# Patient Record
Sex: Male | Born: 1950
Health system: Southern US, Community
[De-identification: ages and names within clinical notes are randomized; demographics above are authoritative.]

## PROBLEM LIST (undated history)

## (undated) DIAGNOSIS — Z9889 Other specified postprocedural states: Secondary | ICD-10-CM

## (undated) DIAGNOSIS — N400 Enlarged prostate without lower urinary tract symptoms: Secondary | ICD-10-CM

## (undated) DIAGNOSIS — G4733 Obstructive sleep apnea (adult) (pediatric): Secondary | ICD-10-CM

## (undated) DIAGNOSIS — R112 Nausea with vomiting, unspecified: Secondary | ICD-10-CM

## (undated) DIAGNOSIS — E785 Hyperlipidemia, unspecified: Secondary | ICD-10-CM

## (undated) DIAGNOSIS — Z803 Family history of malignant neoplasm of breast: Secondary | ICD-10-CM

## (undated) DIAGNOSIS — M1990 Unspecified osteoarthritis, unspecified site: Secondary | ICD-10-CM

## (undated) DIAGNOSIS — J45909 Unspecified asthma, uncomplicated: Secondary | ICD-10-CM

## (undated) DIAGNOSIS — M109 Gout, unspecified: Secondary | ICD-10-CM

## (undated) DIAGNOSIS — G4730 Sleep apnea, unspecified: Secondary | ICD-10-CM

## (undated) DIAGNOSIS — R7303 Prediabetes: Secondary | ICD-10-CM

## (undated) DIAGNOSIS — Z808 Family history of malignant neoplasm of other organs or systems: Secondary | ICD-10-CM

## (undated) DIAGNOSIS — A419 Sepsis, unspecified organism: Secondary | ICD-10-CM

## (undated) DIAGNOSIS — R339 Retention of urine, unspecified: Secondary | ICD-10-CM

## (undated) DIAGNOSIS — E119 Type 2 diabetes mellitus without complications: Secondary | ICD-10-CM

## (undated) HISTORY — DX: Family history of malignant neoplasm of other organs or systems: Z80.8

## (undated) HISTORY — DX: Family history of malignant neoplasm of breast: Z80.3

## (undated) HISTORY — PX: INGUINAL HERNIA REPAIR: SUR1180

## (undated) HISTORY — PX: TONSILLECTOMY: SUR1361

## (undated) HISTORY — DX: Retention of urine, unspecified: R33.9

## (undated) HISTORY — DX: Unspecified asthma, uncomplicated: J45.909

## (undated) HISTORY — PX: REPLACEMENT TOTAL KNEE: SUR1224

## (undated) HISTORY — PX: OTHER SURGICAL HISTORY: SHX169

## (undated) HISTORY — DX: Hyperlipidemia, unspecified: E78.5

## (undated) HISTORY — DX: Gout, unspecified: M10.9

## (undated) HISTORY — PX: KNEE SURGERY: SHX244

## (undated) HISTORY — DX: Benign prostatic hyperplasia without lower urinary tract symptoms: N40.0

## (undated) HISTORY — PX: TENDON REPAIR: SHX5111

## (undated) HISTORY — DX: Obstructive sleep apnea (adult) (pediatric): G47.33

## (undated) HISTORY — DX: Unspecified osteoarthritis, unspecified site: M19.90

## (undated) HISTORY — DX: Sleep apnea, unspecified: G47.30

---

## 2003-03-29 ENCOUNTER — Ambulatory Visit (HOSPITAL_BASED_OUTPATIENT_CLINIC_OR_DEPARTMENT_OTHER): Admission: RE | Admit: 2003-03-29 | Discharge: 2003-03-29 | Payer: Self-pay | Admitting: Orthopedic Surgery

## 2003-03-29 ENCOUNTER — Ambulatory Visit (HOSPITAL_COMMUNITY): Admission: RE | Admit: 2003-03-29 | Discharge: 2003-03-29 | Payer: Self-pay | Admitting: Orthopedic Surgery

## 2004-01-18 ENCOUNTER — Ambulatory Visit (HOSPITAL_COMMUNITY): Admission: RE | Admit: 2004-01-18 | Discharge: 2004-01-18 | Payer: Self-pay | Admitting: Internal Medicine

## 2008-09-12 ENCOUNTER — Encounter (INDEPENDENT_AMBULATORY_CARE_PROVIDER_SITE_OTHER): Payer: Self-pay | Admitting: *Deleted

## 2010-07-09 ENCOUNTER — Other Ambulatory Visit: Payer: Self-pay | Admitting: Orthopedic Surgery

## 2010-07-09 ENCOUNTER — Encounter (HOSPITAL_COMMUNITY): Payer: BC Managed Care – PPO | Attending: Orthopedic Surgery

## 2010-07-09 DIAGNOSIS — G4733 Obstructive sleep apnea (adult) (pediatric): Secondary | ICD-10-CM | POA: Insufficient documentation

## 2010-07-09 DIAGNOSIS — Z01812 Encounter for preprocedural laboratory examination: Secondary | ICD-10-CM | POA: Insufficient documentation

## 2010-07-09 DIAGNOSIS — Z01811 Encounter for preprocedural respiratory examination: Secondary | ICD-10-CM | POA: Insufficient documentation

## 2010-07-09 DIAGNOSIS — Z79899 Other long term (current) drug therapy: Secondary | ICD-10-CM | POA: Insufficient documentation

## 2010-07-09 DIAGNOSIS — M1710 Unilateral primary osteoarthritis, unspecified knee: Secondary | ICD-10-CM | POA: Insufficient documentation

## 2010-07-09 LAB — COMPREHENSIVE METABOLIC PANEL
Albumin: 4.4 g/dL (ref 3.5–5.2)
Alkaline Phosphatase: 52 U/L (ref 39–117)
BUN: 10 mg/dL (ref 6–23)
CO2: 29 mEq/L (ref 19–32)
Chloride: 101 mEq/L (ref 96–112)
Creatinine, Ser: 0.75 mg/dL (ref 0.4–1.5)
GFR calc non Af Amer: >60 mL/min (ref >60)
Glucose, Bld: 112 mg/dL — ABNORMAL HIGH (ref 70–99)
Potassium: 4.4 mEq/L (ref 3.5–5.1)
Total Bilirubin: 0.7 mg/dL (ref 0.3–1.2)

## 2010-07-09 LAB — URINALYSIS, ROUTINE W REFLEX MICROSCOPIC
Glucose, UA: NEGATIVE mg/dL
Hgb urine dipstick: NEGATIVE
Ketones, ur: NEGATIVE mg/dL
Protein, ur: NEGATIVE mg/dL
Urobilinogen, UA: 0.2 mg/dL (ref 0.0–1.0)

## 2010-07-09 LAB — SURGICAL PCR SCREEN
MRSA, PCR: NEGATIVE
Staphylococcus aureus: POSITIVE — AB

## 2010-07-09 LAB — CBC
HCT: 41.7 % (ref 39.0–52.0)
MCH: 30.3 pg (ref 26.0–34.0)
MCHC: 33.1 g/dL (ref 30.0–36.0)
MCV: 91.6 fL (ref 78.0–100.0)
Platelets: 245 10*3/uL (ref 150–400)
RDW: 13 % (ref 11.5–15.5)
WBC: 8.3 10*3/uL (ref 4.0–10.5)

## 2010-07-09 LAB — APTT: aPTT: 33 seconds (ref 24–37)

## 2010-07-17 ENCOUNTER — Inpatient Hospital Stay (HOSPITAL_COMMUNITY)
Admission: RE | Admit: 2010-07-17 | Discharge: 2010-07-22 | DRG: 471 | Disposition: A | Discharge status: Skilled Nursing Facility | Payer: BC Managed Care – PPO | Source: Ambulatory Visit | Attending: Orthopedic Surgery | Admitting: Orthopedic Surgery

## 2010-07-17 DIAGNOSIS — Z79899 Other long term (current) drug therapy: Secondary | ICD-10-CM

## 2010-07-17 DIAGNOSIS — J45909 Unspecified asthma, uncomplicated: Secondary | ICD-10-CM | POA: Diagnosis present

## 2010-07-17 DIAGNOSIS — E7800 Pure hypercholesterolemia, unspecified: Secondary | ICD-10-CM | POA: Diagnosis present

## 2010-07-17 DIAGNOSIS — N400 Enlarged prostate without lower urinary tract symptoms: Secondary | ICD-10-CM | POA: Diagnosis present

## 2010-07-17 DIAGNOSIS — E871 Hypo-osmolality and hyponatremia: Secondary | ICD-10-CM | POA: Diagnosis not present

## 2010-07-17 DIAGNOSIS — F3289 Other specified depressive episodes: Secondary | ICD-10-CM | POA: Diagnosis present

## 2010-07-17 DIAGNOSIS — R509 Fever, unspecified: Secondary | ICD-10-CM | POA: Diagnosis not present

## 2010-07-17 DIAGNOSIS — D62 Acute posthemorrhagic anemia: Secondary | ICD-10-CM | POA: Diagnosis not present

## 2010-07-17 DIAGNOSIS — R110 Nausea: Secondary | ICD-10-CM | POA: Diagnosis not present

## 2010-07-17 DIAGNOSIS — G4733 Obstructive sleep apnea (adult) (pediatric): Secondary | ICD-10-CM | POA: Diagnosis present

## 2010-07-17 DIAGNOSIS — F329 Major depressive disorder, single episode, unspecified: Secondary | ICD-10-CM | POA: Diagnosis present

## 2010-07-17 DIAGNOSIS — G4700 Insomnia, unspecified: Secondary | ICD-10-CM | POA: Diagnosis present

## 2010-07-17 DIAGNOSIS — M1710 Unilateral primary osteoarthritis, unspecified knee: Principal | ICD-10-CM | POA: Diagnosis present

## 2010-07-17 LAB — ABO/RH: ABO/RH(D): O POS

## 2010-07-17 LAB — TYPE AND SCREEN: ABO/RH(D): O POS

## 2010-07-18 LAB — CBC
HCT: 30.2 % — ABNORMAL LOW (ref 39.0–52.0)
Hemoglobin: 9.9 g/dL — ABNORMAL LOW (ref 13.0–17.0)
MCH: 30.1 pg (ref 26.0–34.0)
RBC: 3.29 MIL/uL — ABNORMAL LOW (ref 4.22–5.81)

## 2010-07-18 LAB — BASIC METABOLIC PANEL
GFR calc non Af Amer: >60 mL/min (ref >60)
Potassium: 4 mEq/L (ref 3.5–5.1)
Sodium: 136 mEq/L (ref 135–145)

## 2010-07-18 LAB — PROTIME-INR
INR: 1.1 (ref 0.00–1.49)
Prothrombin Time: 14.4 seconds (ref 11.6–15.2)

## 2010-07-19 LAB — CBC
HCT: 26.6 % — ABNORMAL LOW (ref 39.0–52.0)
Hemoglobin: 8.8 g/dL — ABNORMAL LOW (ref 13.0–17.0)
WBC: 10.6 10*3/uL — ABNORMAL HIGH (ref 4.0–10.5)

## 2010-07-19 LAB — BASIC METABOLIC PANEL
Chloride: 98 mEq/L (ref 96–112)
GFR calc Af Amer: >60 mL/min (ref >60)
Potassium: 3.5 mEq/L (ref 3.5–5.1)
Sodium: 134 mEq/L — ABNORMAL LOW (ref 135–145)

## 2010-07-19 LAB — PROTIME-INR: INR: 1.26 (ref 0.00–1.49)

## 2010-07-20 LAB — BASIC METABOLIC PANEL
CO2: 30 mEq/L (ref 19–32)
Calcium: 8.6 mg/dL (ref 8.4–10.5)
Chloride: 97 mEq/L (ref 96–112)
GFR calc Af Amer: >60 mL/min (ref >60)
Sodium: 134 mEq/L — ABNORMAL LOW (ref 135–145)

## 2010-07-20 LAB — CBC
HCT: 26.6 % — ABNORMAL LOW (ref 39.0–52.0)
MCV: 92 fL (ref 78.0–100.0)
Platelets: 195 10*3/uL (ref 150–400)
RBC: 2.89 MIL/uL — ABNORMAL LOW (ref 4.22–5.81)
WBC: 12.3 10*3/uL — ABNORMAL HIGH (ref 4.0–10.5)

## 2010-07-21 LAB — CBC
Hemoglobin: 8 g/dL — ABNORMAL LOW (ref 13.0–17.0)
MCH: 31.1 pg (ref 26.0–34.0)
Platelets: 215 10*3/uL (ref 150–400)
RBC: 2.57 MIL/uL — ABNORMAL LOW (ref 4.22–5.81)
RDW: 12.9 % (ref 11.5–15.5)

## 2010-07-22 NOTE — Op Note (Signed)
NAME:  Corey Ward, Corey Ward                 ACCOUNT NO.:  [id number]  MEDICAL RECORD NO.:  [id number]           PATIENT TYPE:  I  LOCATION:  0005                         FACILITY:  [facility]  PHYSICIAN:  Ollen Gross, M.D.    DATE OF BIRTH:  17-Aug-1950  DATE OF PROCEDURE:  07/17/2010 DATE OF DISCHARGE:                              OPERATIVE REPORT   PREOPERATIVE DIAGNOSIS:  Osteoarthritis, bilateral knees.  POSTOPERATIVE DIAGNOSIS:  Osteoarthritis, bilateral knees.  PROCEDURE:  Bilateral total knee arthroplasty.  SURGEON:  Ollen Gross, M.D.  ASSISTANT:  Alexzandrew L. Perkins, P.A.C.  ANESTHESIA:  Epidural with general.  ESTIMATED BLOOD LOSS:  Minimal.  DRAIN:  Autotransfusion drain x1 on each side.  TOURNIQUET TIME:  Left knee 51 minutes at 300 mmHg.  Right knee 40 minutes at 300 mmHg.  COMPLICATIONS:  None.  CONDITION:  Stable to recovery.  BRIEF CLINICAL NOTE:  Corey Ward is a 60 year old male with severe end- stage erosive arthritis of both knees with equal symptoms on both.  He has had long-term nonoperative management which has not been successful. He was given the option of doing one knee at a time versus both knees at the same time and chose to do both at the same time.  He presents now for bilateral total knee arthroplasty.  PROCEDURE IN DETAIL:  After successful administration of epidural and general anesthetic, tourniquet was placed high on both thighs.  Both lower extremities prepped and draped in the usual sterile fashion. Right knee was done first.  Right lower extremity was wrapped in Esmarch, knee flexed, tourniquet inflated to 300 mmHg.  Midline incision was made with #10 blade through subcutaneous tissue to the level of the extensor mechanism.  A fresh blade was used make a medial parapatellar arthrotomy.  Soft tissue on the proximal medial tibia was subperiosteally elevated to the joint line with a knife and into the semimembranosus bursa with a Cobb  elevator.  Soft tissue laterally was elevated with attention being paid to avoid patellar tendon on tibial tubercle.  The patella was everted, knee flexed 90 degrees and ACL and PCL removed.  Drill was used create a starting hole in the distal femur. A 5-degrees right valgus alignment guide was placed.  Distal femoral cutting block was pinned to remove 11 mm off the distal femur. Resection was made with an oscillating saw.  The tibia subluxed forward and the menisci removed.  Extramedullary tibial alignment guide was placed referencing proximally at the medial aspect of tibial tubercle and distally along the second metatarsal axis and tibial crest.  Block was pinned to remove 2 mm off the more deficient medial side.  There was a large defect medially, so resection was a little larger than usual.  Resection was made with an oscillating saw.  Osteophytes were all removed.  Size 5 was the most appropriate tibial component and the proximal tibia was prepared with a modular drill and keel punch for the size 5.  Femoral sizing guide was placed, size 5 was most appropriate femur. Rotation was marked off the epicondylar axis and confirmed by creating rectangular flexion gap at  90 degrees.  The block was pinned in this rotation and the anterior-posterior chamfer cuts were made for the size 5.  Intercondylar block was placed and that cut was made.  Trial size 5 posterior stabilized femur was placed.  A 12.5 mm posterior stabilized rotating platform insert trial was placed.  This had a little play, so went to 15 which allowed for full extension with excellent varus-valgus and anterior-posterior balance throughout full range of motion.  Patella was everted and thickness measured to be 26 mm.  Freehand resection was taken to 14 mm, 41 template was placed, lug holes were drilled, trial patella was placed and it tracks normally.  Osteophytes were removed off the posterior femur with the trial in  place.  All trials were removed and the cut bone surfaces repaired with pulsatile lavage.  Cement was mixed and once ready for implantation, the size 5 mobile bearing tibial tray, size 5 posterior stabilized femur and 41 patella were cemented in place.  Patella was held with a clamp.  Trial 15-mm insert was placed, knee held in full extension, all extruded cement removed.  When the cement had fully hardened, then the permanent 15-mm posterior stabilized rotating platform insert was placed in the tibial tray.  The wound was copiously irrigated with saline solution and the arthrotomy closed over an Autovac drain with interrupted #1 PDS.  Flexion against gravity was 140 degrees.  Patella tracked normally.  Tourniquet was then released for a total time of 40 minutes.  The drain was then hooked to suction. Subcu was then closed with interrupted 2-0 Vicryl and subcuticular running 4-0 Monocryl.  The left knee was then addressed.  The left lower extremity was wrapped in Esmarch, knee flexed, tourniquet inflated to 300 mmHg.  Midline incision was made with #10 blade through subcutaneous tissue to the extensor mechanism.  Fresh blade was used make a medial parapatellar arthrotomy.  Full soft tissue releases were performed.  Patella was everted and knee flexed 90 degrees.  ACL and PCL were removed.  Drill was used to create a starting hole in the distal femur and canal was thoroughly irrigated.  A 5-degree left valgus alignment guide was placed and distal femoral cutting block pinned to remove 11 mm of the distal femur.  Distal femoral resection was made an oscillating saw.  The tibia subluxed forward and the menisci removed.  Extramedullary tibial alignment guide was placed referencing proximally at the  medial aspect of tibial tubercle and distally along the second metatarsal axis and tibial crest.  Block was pinned to remove 2 mm off the more deficient medial side.  Once again there was a  large medial defect, so resection was little bit lower than usual to get down to the base of the defect.  Size 5 was the most appropriate tibial component and the proximal tibia was prepared with modular drill and keel punch for the size 5.  Femoral sizing guide was placed, size 5 was the most appropriate femur. Rotation was marked off the epicondylar axis again formed by creating rectangular flexion gap at 90 degrees.  The block was pinned in this rotation and the anterior-posterior chamfer cuts were made. Intercondylar block was placed and that cut was made.  Trial size 5 posterior stabilized femur was placed.  A 15 mm posterior stabilized rotating platform insert trial was placed.  There was a tiny bit of play with 15, so went to 17.5 which allowed for full extension with excellent varus-valgus and anterior-posterior balance  throughout that full range of motion.  Patella was everted, thickness measured to 26 mm.  Freehand resection was taken to 14 mm, 41 template was placed, lug holes were drilled, trial patella was placed and it tracked normally.  Osteophytes were removed off the posterior femur with the trial in place.  All trials were removed and the cut bone surfaces were prepared with pulsatile lavage.  Cement was mixed and once ready for implantation, the size 5 mobile bearing tibial tray, size 5 posterior stabilized femur, 41 patella were cemented in place.  Patella was held with a clamp.  Trial 17.5-mm insert was placed, knee held in full extension, all extruded cement removed.  When the cement fully hardened, then the permanent 17.5- mm posterior stabilized rotating platform insert was placed in the tibial tray.  The wound was copiously irrigated with saline solution and arthrotomy closed over Autovac drain with interrupted #1 PDS.  Flexion against gravity was 135 degrees.  Patella tracks normally.  Tourniquet released after total time of 51 minutes.  Drain was hooked to  suction. Subcu closed with interrupted 2-0 Vicryl, subcuticular running 4-0 Monocryl.  The incisions were then cleaned and dried and Steri-Strips and a bulky sterile dressing were applied.  He was placed into bilateral knee immobilizers, awakened and transported to recovery in stable condition.     Ollen Gross, M.D.     FA/MEDQ  D:  07/17/2010  T:  07/17/2010  Job:  [id number]  Electronically Signed by Ollen Gross M.D. on 07/22/2010 06:48:17 PM

## 2010-08-07 NOTE — Discharge Summary (Signed)
NAME:  Corey Ward, Corey Ward                 ACCOUNT NO.:  [id number]  MEDICAL RECORD NO.:  [id number]           PATIENT TYPE:  I  LOCATION:  1603                         FACILITY:  [facility]  PHYSICIAN:  Ollen Gross, M.D.    DATE OF BIRTH:  1950/05/22  DATE OF ADMISSION:  07/17/2010 DATE OF DISCHARGE:  07/22/2010                        DISCHARGE SUMMARY - REFERRING   ADMITTING DIAGNOSES: 1. Osteoarthritis, bilateral knees. 2. History of depression. 3. History of asthma. 4. Sleep apnea, uses CPAP. 5. Hypercholesterolemia. 6. Benign prostatic hypertrophy. 7. Past history of urinary retention. 8. Osteoarthritis. 9. Insomnia.  DISCHARGE DIAGNOSES: 1. Osteoarthritis bilateral knees, status post bilateral total knee     replacement arthroplasties. 2. Postop acute blood loss anemia, did not require transfusion. 3. Postop hyponatremia, stable. 4. History of depression. 5. History of asthma. 6. Sleep apnea, uses CPAP. 7. Hypercholesterolemia. 8. Benign prostatic hypertrophy. 9. Past history of urinary retention. 10.Osteoarthritis. 11.Insomnia.  PROCEDURE:  Jul 17, 2010, bilateral total knee replacement arthroplasty.  SURGEON:  Ollen Gross, MD  ASSISTANT:  Alexzandrew L. Perkins, [facility]  ANESTHESIA:  General with epidural placed for postoperative management.  TOURNIQUET TIME:  Tourniquet time on the left knee was 51 and tourniquet time on the right knee was 40.  CONSULTS:  Anesthesia Department for epidural management.  BRIEF HISTORY:  The patient is a 60 year old male with severe end-stage arthritis of both knees.  Symptoms have been equally progressive.  He has had long-term nonoperative management, which has not been successful.  Now, he likes to proceed with surgical intervention.  He would like to proceed with both of the knees at the same time.  LABORATORY DATA:  CBC on admission showed a hemoglobin of 13.8, hematocrit of 41.7, white cell count 8.3, platelets 245.  PT/INR  were 13.0 and 0.96 with a PTT of 33.  Chem panel on admission all within normal limits.  Preop UA was negative.  Blood group type O positive. Nasal swabs were positive for staph aureus, but negative for MRSA. Serial CBCs were followed.  Hemoglobin did decline down to 9.9, then 8.8, where stabilized at 8.9 on the next day.  The last H and H were 8.0 and 23.6.  Serial pro times followed per Coumadin protocol.  Last PT/INR at time of transfer 18.6 and 1.53.  Serial BMETs followed for 3 days. Sodium did drop down from 130-136, got as low as 134, where it stabilized.  The remaining electrolytes all remained within normal limits.  X-RAYS:  He had a two-view chest x-ray at [facility] dated February 21, 2010, no active disease.  EKG dated April 26, 2010, sinus rhythm, normal EKG.  HOSPITAL COURSE:  The patient was admitted to [facility], taken to OR, underwent above-stated procedure without complications. The patient tolerated procedure well, later transferred to recovery room, to orthopedic floor.  He was given an epidural postop for pain control.  It was managed by Anesthesia.  He was transferred up to the orthopedic floor for continued care.  He had actually doing pretty well on the morning of day #1.  He did have a fair amount of  numbness right after the procedure on the evening of surgery and the epidural was adjusted.  He did want to look into a skilled facility postop, so we got social work involved.  We resumed his medications.  He had a history of BPH and had some prior history of urinary retention following surgery. He had the Foley placed for the first couple of days.  He was started on Coumadin for DVT prophylaxis, that one started until the evening of day #1 and then we added Lovenox bridge later on after the epidural was out. The epidural remained until postop day #2, when it was removed by Anesthesia.  He was initially started on PCA for pain  control.  He was weaned over to p.o. meds.  Once the epidural came out with the expected increasing pain, we left the PCA on day #2.  We did follow his I's and O's.  Once the epidural came out on postop day #2, we started Lovenox bridging 30 mg subcu injection every 12 hours, that was started at least 6 hours after the epidural was removed.  He started getting up on day #1 and day #2, just stand and pivot once the epidural was out and he was up moving around later that afternoon on postop day #2, walking 75 feet, then up to 150 feet.  By day #3, he had run a little bit of temperature on the evening.  His hemoglobin dropped down to 8.8 on day #2 and it was noted to be 8.9 on day #3, where it stabilized.  He had had a prior history of urinary retention and since we are following his I's and O's, we left his Foley in a little bit longer, removed it on postop day #3. We also started Flomax due to his past history.  He was kept through the weekend and on Saturday and Sunday he continued to progress with therapy.  He did a little nauseated on Saturday and Sunday and we encouraged him to use the Dulcolax tabs and laxatives and suppositories as needed to help out.  He had not moved his bowels yet, but that did proceed over the weekend.  He was seen back on rounds on postop day #5 on Monday morning by Dr. Lequita Halt.  His nausea had improved.  He had been moving his bowels.  It was felt that bed should be available over at [facility] on today, arrangements are being made and he is going to be transferred over at that time.  DISCHARGE PLAN: 1. The patient to be transferred over to [facility] on today's date,     Jul 22, 2010. 2. Discharge diagnoses, please see above.  DISCHARGE MEDICATIONS:  Current medications at the time of transfer include: 1. Colace 100 mg p.o. b.i.d. 2. Coumadin protocol.  Please titrate Coumadin level for target INR     between 2.0 and 3.0.  He needs to be on Coumadin  for 4 weeks     following procedure. 3. Zoloft 50 mg p.o. daily. 4. Vytorin 10/40 mg p.o. daily at bedtime. 5. Nu-Iron 150 mg p.o. b.i.d. for 3 weeks, then discontinue the Nu-     Iron. 6. Lovenox 30 mg subcu injection every 12 hours.  Continue the Lovenox     until the INR for his Coumadin is 2.0 or greater.  Once the INR has     reached 2.0 or greater, you can discontinue the Lovenox. 7. Flomax 0.4 mg p.o. daily.  Continue this for 2  more days, then     discontinue the Flomax and only use as needed. 8. Robaxin 500 mg p.o. q.6-8 hours p.r.n. spasm. 9. Restoril 15-30 mg p.o. q.h.s. p.r.n. sleep. 10.OxyIR 5 mg 1-2 tablets every 4 hours as needed for moderate pain. 11.Tylenol 325 mg 1 or 2 every 4-6 hours as needed for mild pain,     temperature, or headache. 12.Laxative of choice. 13.Enema of choice.  DIET:  Heart-healthy diet.  ACTIVITY:  He is weightbearing as tolerated to both lower extremities. Gait training, ambulation, ADLs, range of motion, and strengthening exercises for total knee protocol.  Needs to have knee immobilizer on at least one if not both legs when he is up ambulating and weightbearing. Once he is able to do multiple straight leg raises with the legs, he can discontinue the knee immobilizers.  Please note, he may start showering once he goes over to [facility], can remove the dressing and shower daily.  Do not submerge the incision under water though.  Daily dressing changes.  FOLLOWUP:  The patient needs to follow up with Dr. Lequita Halt in the office next week either on Tuesday the 15th or Thursday the 17th.  Please contact the office at [phone] to help arrange appointment and followup care of this patient.  DISPOSITION:  Planning to go to [facility] today.  CONDITION UPON DISCHARGE:  Improving.     Alexzandrew L. Julien Girt, P.A.C.   ______________________________ Ollen Gross, M.D.    ALP/MEDQ  D:  07/22/2010  T:  07/22/2010  Job:   [id number]  cc:   Loraine Leriche A. Perini, M.D. Fax: [phone]  Courtney Paris, M.D. Fax: [phone]  [place]  Electronically Signed by Patrica Duel P.A.C. on 08/01/2010 10:36:45 AM Electronically Signed by Ollen Gross M.D. on 08/07/2010 12:53:15 PM

## 2010-08-07 NOTE — H&P (Signed)
NAME:  Corey Ward, Corey Ward                 ACCOUNT NO.:  [id number]  MEDICAL RECORD NO.:  [id number]           PATIENT TYPE:  I  LOCATION:  1603                         FACILITY:  [facility]  PHYSICIAN:  Ollen Gross, M.D.    DATE OF BIRTH:  09-03-1950  DATE OF ADMISSION:  07/17/2010 DATE OF DISCHARGE:                             HISTORY & PHYSICAL   CHIEF COMPLAINT:  Bilateral knee pain.  HISTORY OF PRESENT ILLNESS:  The patient is a 60 year old male who has been seen by Dr. Lequita Halt for ongoing bilateral knee pain.  He said knee problems for 7 to 8 years now.  It has progressively gotten worse and they both hurt equally.  He is a Tax inspector and a basketball coach at [employer] and starting to interfere with his activities and also his coaching.  He has had cortisone injections and also gel shots without benefit.  He is at a point now where he would like to have something done about it.  It was discussed doing one at a time or both with Dr. Lequita Halt.  He has elected to proceed with bilateral knee replacements.  He has been seen by Dr. Rodrigo Ran preoperatively and felt to be stable for the upcoming procedure.  ALLERGIES:  No known drug allergies.  CURRENT MEDICATIONS:  Sertraline, Vytorin, fish oil, naproxen.  PAST MEDICAL HISTORY:  Depression.  Past history of asthma, sleep apnea for which he uses a CPAP, elevated cholesterol, enlarged prostate.  Past history of urinary retention and also osteoarthritis and insomnia.  PAST SURGICAL HISTORY:  Right knee surgery for torn meniscus, left knee surgery for torn meniscus, right foot surgery for tumor tendon and also hernia repair.  FAMILY HISTORY:  Father with emphysema, congestive heart failure. Mother living in good health at age 70.  SOCIAL HISTORY:  Married.  He is a Banker.  Nonsmoker.  No alcohol.  He does have a caregiver lined up, two-story home.  REVIEW OF SYSTEMS:  GENERAL:  No fevers, chills, night  sweats. NEUROLOGIC:  No seizures, syncope or paralysis.  RESPIRATORY:  No shortness breath, productive cough or hemoptysis.  CARDIOVASCULAR:  No chest pain or orthopnea.  GI:  No nausea, constipation, diarrhea, constipation.  GU:  Does have a little bit of weak stream, nocturia.  He does have a history of enlarged prostate.  No dysuria, hematuria. Please note the last time after surgery he did have some difficulty with some urinary retention, requiring Flomax.  MUSCULOSKELETAL:  Bilateral knee pain.  PHYSICAL EXAMINATION:  VITAL SIGNS:  Pulse 58, respirations 12, blood pressure 120/82. GENERAL:  60 year old white male, well-nourished, well-developed, tall frame, overweight, no acute distress.  He is accompanied by his wife, Corey Ward.  He is alert, oriented and cooperative. HEENT:  Normocephalic, atraumatic.  Pupils are round and reactive.  EOMs intact.  Does not wear glasses. NECK:  Supple. CHEST:  Clear. HEART:  Regular rate and rhythm without murmur, S1 and S2. ABDOMEN:  Soft, round, slightly protuberant.  Bowel sounds present. RECTAL, BREASTS, GENITALIA:  Not done, not part of present illness. EXTREMITIES:  Left knee, slight  varus.  Motor intact.  Marked crepitus noted.  Right knee, same slight varus with malalignment.  Motor function intact.  Marked crepitus noted.  IMPRESSION:  Osteoarthritis, bilateral knees.  PLAN:  The patient was admitted to [facility] to go undergo bilateral total knee replacement arthroplasty.  Surgery will be performed by Dr. Ollen Gross.     Alexzandrew L. Julien Girt, P.A.C.   ______________________________ Ollen Gross, M.D.    ALP/MEDQ  D:  07/18/2010  T:  07/18/2010  Job:  [id number]  cc:   Loraine Leriche A. Perini, M.D. Fax: [phone]  Courtney Paris, M.D. Fax: [phone]  Electronically Signed by Patrica Duel P.A.C. on 08/05/2010 11:06:57 AM Electronically Signed by Ollen Gross M.D. on 08/07/2010 12:53:18 PM

## 2010-08-29 ENCOUNTER — Ambulatory Visit: Payer: BC Managed Care – PPO | Attending: Orthopedic Surgery | Admitting: Physical Therapy

## 2010-08-29 DIAGNOSIS — IMO0001 Reserved for inherently not codable concepts without codable children: Secondary | ICD-10-CM | POA: Insufficient documentation

## 2010-08-29 DIAGNOSIS — Z96659 Presence of unspecified artificial knee joint: Secondary | ICD-10-CM | POA: Insufficient documentation

## 2010-08-29 DIAGNOSIS — M25659 Stiffness of unspecified hip, not elsewhere classified: Secondary | ICD-10-CM | POA: Insufficient documentation

## 2010-08-29 DIAGNOSIS — R262 Difficulty in walking, not elsewhere classified: Secondary | ICD-10-CM | POA: Insufficient documentation

## 2010-08-29 DIAGNOSIS — M25569 Pain in unspecified knee: Secondary | ICD-10-CM | POA: Insufficient documentation

## 2010-09-04 ENCOUNTER — Ambulatory Visit: Payer: BC Managed Care – PPO | Admitting: Physical Therapy

## 2010-09-11 ENCOUNTER — Ambulatory Visit: Payer: BC Managed Care – PPO | Admitting: Physical Therapy

## 2011-08-14 ENCOUNTER — Encounter: Payer: Self-pay | Admitting: Gastroenterology

## 2012-06-02 ENCOUNTER — Encounter: Payer: Self-pay | Admitting: Gastroenterology

## 2012-08-24 ENCOUNTER — Ambulatory Visit (AMBULATORY_SURGERY_CENTER): Payer: BC Managed Care – PPO

## 2012-08-24 VITALS — Ht 76.0 in | Wt 256.8 lb

## 2012-08-24 DIAGNOSIS — Z1211 Encounter for screening for malignant neoplasm of colon: Secondary | ICD-10-CM

## 2012-08-24 DIAGNOSIS — Z860100 Personal history of colon polyps, unspecified: Secondary | ICD-10-CM

## 2012-08-24 MED ORDER — MOVIPREP 100 G PO SOLR: ORAL | Status: DC

## 2012-08-25 ENCOUNTER — Encounter: Payer: Self-pay | Admitting: Gastroenterology

## 2012-09-07 ENCOUNTER — Encounter: Payer: Self-pay | Admitting: Gastroenterology

## 2012-09-07 ENCOUNTER — Ambulatory Visit (AMBULATORY_SURGERY_CENTER): Payer: BC Managed Care – PPO | Admitting: Gastroenterology

## 2012-09-07 VITALS — BP 105/68 | HR 51 | Temp 96.5°F | Resp 15 | Ht 76.0 in | Wt 256.0 lb

## 2012-09-07 DIAGNOSIS — D126 Benign neoplasm of colon, unspecified: Secondary | ICD-10-CM

## 2012-09-07 DIAGNOSIS — Z1211 Encounter for screening for malignant neoplasm of colon: Secondary | ICD-10-CM

## 2012-09-07 MED ORDER — SODIUM CHLORIDE 0.9 % IV SOLN: 500.0000 mL | INTRAVENOUS | Status: DC

## 2012-09-07 NOTE — Progress Notes (Signed)
Called to room to assist during endoscopic procedure.  Patient ID and intended procedure confirmed with present staff. Received instructions for my participation in the procedure from the performing physician. ewm 

## 2012-09-07 NOTE — Op Note (Signed)
 [facility] [address] [place], [address]   COLONOSCOPY PROCEDURE REPORT  PATIENT: Corey, Ward  MR#: [id number] BIRTHDATE: Dec 03, 1950 , 61  yrs. old GENDER: Male ENDOSCOPIST: Meryl Dare, MD, [facility] REFERRED HY:QMVH Waynard Edwards, M.D. PROCEDURE DATE:  09/07/2012 PROCEDURE:   Colonoscopy with biopsy and snare polypectomy ASA CLASS:   Class II INDICATIONS:average risk screening and Last colonoscopy performed 10 years ago. MEDICATIONS: MAC sedation, administered by CRNA and propofol (Diprivan) 300mg  IV DESCRIPTION OF PROCEDURE:   After the risks benefits and alternatives of the procedure were thoroughly explained, informed consent was obtained.  A digital rectal exam revealed no abnormalities of the rectum.   The LB [id number]  endoscope was introduced through the anus and advanced to the cecum, which was identified by both the appendix and ileocecal valve. No adverse events experienced.   The quality of the prep was excellent, using MoviPrep  The instrument was then slowly withdrawn as the colon was fully examined.  COLON FINDINGS:   A sessile polyp measuring 5 mm in size was found in the ascending colon.  A polypectomy was performed with a cold snare.  The resection was complete and the polyp tissue was completely retrieved.   A sessile polyp measuring 3 mm in size was found in the transverse colon.  A polypectomy was performed with cold forceps.  The resection was complete and the polyp tissue was completely retrieved.   The colon was otherwise normal.  There was no diverticulosis, inflammation, polyps or cancers unless previously stated.  Retroflexed views revealed no abnormalities. The time to cecum=0 minutes 50 seconds.  Withdrawal time=12 minutes 32 seconds.  The scope was withdrawn and the procedure completed.  COMPLICATIONS: There were no complications.  ENDOSCOPIC IMPRESSION: 1.   Sessile polyp measuring 5 mm in the ascending  colon; polypectomy performed with a cold snare 2.   Sessile polyp measuring 3 mm in the transverse colon; polypectomy performed with cold forceps  RECOMMENDATIONS: 1.  Await pathology results 2.  Repeat colonoscopy in 5 years if polyp(s) adenomatous; otherwise 10 years  eSigned:  Meryl Dare, MD, [facility] 09/07/2012 10:09 AM

## 2012-09-07 NOTE — Patient Instructions (Addendum)
A handout was given to your care partner on polyps.  You may resume your current medications today.  Please call if any questions or concerns.    YOU HAD AN ENDOSCOPIC PROCEDURE TODAY AT THE [facility]: Refer to the procedure report that was given to you for any specific questions about what was found during the examination.  If the procedure report does not answer your questions, please call your gastroenterologist to clarify.  If you requested that your care partner not be given the details of your procedure findings, then the procedure report has been included in a sealed envelope for you to review at your convenience later.  YOU SHOULD EXPECT: Some feelings of bloating in the abdomen. Passage of more gas than usual.  Walking can help get rid of the air that was put into your GI tract during the procedure and reduce the bloating. If you had a lower endoscopy (such as a colonoscopy or flexible sigmoidoscopy) you may notice spotting of blood in your stool or on the toilet paper. If you underwent a bowel prep for your procedure, then you may not have a normal bowel movement for a few days.  DIET: Your first meal following the procedure should be a light meal and then it is ok to progress to your normal diet.  A half-sandwich or bowl of soup is an example of a good first meal.  Heavy or fried foods are harder to digest and may make you feel nauseous or bloated.  Likewise meals heavy in dairy and vegetables can cause extra gas to form and this can also increase the bloating.  Drink plenty of fluids but you should avoid alcoholic beverages for 24 hours.  ACTIVITY: Your care partner should take you home directly after the procedure.  You should plan to take it easy, moving slowly for the rest of the day.  You can resume normal activity the day after the procedure however you should NOT DRIVE or use heavy machinery for 24 hours (because of the sedation medicines used during the test).    SYMPTOMS  TO REPORT IMMEDIATELY: A gastroenterologist can be reached at any hour.  During normal business hours, 8:30 AM to 5:00 PM Monday through Friday, call [phone].  After hours and on weekends, please call the GI answering service at [phone] who will take a message and have the physician on call contact you.   Following lower endoscopy (colonoscopy or flexible sigmoidoscopy):  Excessive amounts of blood in the stool  Significant tenderness or worsening of abdominal pains  Swelling of the abdomen that is new, acute  Fever of 100F or higher    FOLLOW UP: If any biopsies were taken you will be contacted by phone or by letter within the next 1-3 weeks.  Call your gastroenterologist if you have not heard about the biopsies in 3 weeks.  Our staff will call the home number listed on your records the next business day following your procedure to check on you and address any questions or concerns that you may have at that time regarding the information given to you following your procedure. This is a courtesy call and so if there is no answer at the home number and we have not heard from you through the emergency physician on call, we will assume that you have returned to your regular daily activities without incident.  SIGNATURES/CONFIDENTIALITY: You and/or your care partner have signed paperwork which will be entered into your electronic medical record.    These signatures attest to the fact that that the information above on your After Visit Summary has been reviewed and is understood.  Full responsibility of the confidentiality of this discharge information lies with you and/or your care-partner.  

## 2012-09-07 NOTE — Progress Notes (Signed)
No complaints noted in the recovery room. [place]  Patient did not experience any of the following events: a burn prior to discharge; a fall within the facility; wrong site/side/patient/procedure/implant event; or a hospital transfer or hospital admission upon discharge from the facility. (G8907) Patient did not have preoperative order for IV antibiotic SSI prophylaxis. (G8918)  

## 2012-09-08 ENCOUNTER — Telehealth: Payer: Self-pay | Admitting: *Deleted

## 2012-09-08 NOTE — Telephone Encounter (Signed)
  Follow up Call-  Call back number 09/07/2012  Post procedure Call Back phone  # [phone]  Permission to leave phone message Yes     Patient questions:  Do you have a fever, pain , or abdominal swelling? no Pain Score  0 *  Have you tolerated food without any problems? yes  Have you been able to return to your normal activities? yes  Do you have any questions about your discharge instructions: Diet   no Medications  no Follow up visit  no  Do you have questions or concerns about your Care? no  Actions: * If pain score is 4 or above: No action needed, pain <4.

## 2012-09-20 ENCOUNTER — Encounter: Payer: Self-pay | Admitting: Gastroenterology

## 2017-08-05 ENCOUNTER — Encounter: Payer: Self-pay | Admitting: Gastroenterology

## 2017-11-06 ENCOUNTER — Encounter: Payer: Self-pay | Admitting: Gastroenterology

## 2017-12-24 ENCOUNTER — Ambulatory Visit (AMBULATORY_SURGERY_CENTER): Payer: Self-pay

## 2017-12-24 ENCOUNTER — Encounter: Payer: Self-pay | Admitting: Gastroenterology

## 2017-12-24 VITALS — Ht 76.0 in | Wt 280.0 lb

## 2017-12-24 DIAGNOSIS — Z86010 Personal history of colonic polyps: Secondary | ICD-10-CM

## 2017-12-24 MED ORDER — NA SULFATE-K SULFATE-MG SULF 17.5-3.13-1.6 GM/177ML PO SOLN: 1.0000 | Freq: Once | ORAL | 0 refills | Status: AC

## 2017-12-24 NOTE — Progress Notes (Signed)
Denies allergies to eggs or soy products. Denies complication of anesthesia or sedation. Denies use of weight loss medication. Denies use of O2.   Emmi instructions declined.  Phentermine is on the patients medication list but patient states he has not taken this medication in a couple of years. Patient was informed of the importance of not taking Phentermine within 10 days of the procedure.

## 2018-01-07 ENCOUNTER — Encounter: Payer: Self-pay | Admitting: Gastroenterology

## 2018-01-07 ENCOUNTER — Ambulatory Visit (AMBULATORY_SURGERY_CENTER): Payer: Medicare Other | Admitting: Gastroenterology

## 2018-01-07 VITALS — BP 101/63 | HR 55 | Temp 99.3°F | Resp 13 | Ht 76.0 in | Wt 280.0 lb

## 2018-01-07 DIAGNOSIS — D123 Benign neoplasm of transverse colon: Secondary | ICD-10-CM | POA: Diagnosis not present

## 2018-01-07 DIAGNOSIS — Z86010 Personal history of colonic polyps: Secondary | ICD-10-CM | POA: Diagnosis present

## 2018-01-07 MED ORDER — SODIUM CHLORIDE 0.9 % IV SOLN: 500.0000 mL | Freq: Once | INTRAVENOUS | Status: DC

## 2018-01-07 NOTE — Op Note (Signed)
[facility] Patient Name: Corey Ward Procedure Date: 01/07/2018 10:55 AM MRN: [id number] Endoscopist: Ladene Artist , MD Age: 67 Referring MD:  Date of Birth: Aug 11, 1950 Gender: Male Account #: [id number] Procedure:                Colonoscopy Indications:              Surveillance: Personal history of adenomatous                            polyps on last colonoscopy 5 years ago Medicines:                Monitored Anesthesia Care Procedure:                Pre-Anesthesia Assessment:                           - Prior to the procedure, a History and Physical                            was performed, and patient medications and                            allergies were reviewed. The patient's tolerance of                            previous anesthesia was also reviewed. The risks                            and benefits of the procedure and the sedation                            options and risks were discussed with the patient.                            All questions were answered, and informed consent                            was obtained. Prior Anticoagulants: The patient has                            taken no previous anticoagulant or antiplatelet                            agents. ASA Grade Assessment: II - A patient with                            mild systemic disease. After reviewing the risks                            and benefits, the patient was deemed in                            satisfactory condition to undergo the procedure.  After obtaining informed consent, the colonoscope                            was passed under direct vision. Throughout the                            procedure, the patient's blood pressure, pulse, and                            oxygen saturations were monitored continuously. The                            Colonoscope was introduced through the anus and                            advanced to the the cecum,  identified by                            appendiceal orifice and ileocecal valve. The                            ileocecal valve, appendiceal orifice, and rectum                            were photographed. The quality of the bowel                            preparation was excellent. The colonoscopy was                            performed without difficulty. The patient tolerated                            the procedure well. Scope In: 11:05:46 AM Scope Out: 87:56:43 AM Scope Withdrawal Time: 0 hours 11 minutes 36 seconds  Total Procedure Duration: 0 hours 12 minutes 31 seconds  Findings:                 The perianal and digital rectal examinations were                            normal.                           A 6 mm polyp was found in the transverse colon. The                            polyp was sessile. The polyp was removed with a                            cold snare. Resection and retrieval were complete.                           Multiple small-mouthed diverticula were found in  the left colon. There was evidence of diverticular                            spasm. Peri-diverticular erythema was seen. There                            was evidence of an impacted diverticulum.                           Internal hemorrhoids were found during                            retroflexion. The hemorrhoids were small and Grade                            I (internal hemorrhoids that do not prolapse).                           The exam was otherwise without abnormality on                            direct and retroflexion views. Complications:            No immediate complications. Estimated blood loss:                            None. Estimated Blood Loss:     Estimated blood loss: none. Impression:               - One 6 mm polyp in the transverse colon, removed                            with a cold snare. Resected and retrieved.                           -  Moderate diverticulosis in the left colon.                           - Internal hemorrhoids.                           - The examination was otherwise normal on direct                            and retroflexion views. Recommendation:           - Repeat colonoscopy in 5 years for surveillance.                           - Patient has a contact number available for                            emergencies. The signs and symptoms of potential                            delayed complications were discussed with the  patient. Return to normal activities tomorrow.                            Written discharge instructions were provided to the                            patient.                           - High fiber diet long term.                           - Continue present medications.                           - Await pathology results. Ladene Artist, MD 01/07/2018 11:24:01 AM This report has been signed electronically.

## 2018-01-07 NOTE — Progress Notes (Signed)
Report given to PACU, vss 

## 2018-01-07 NOTE — Progress Notes (Signed)
Called to room to assist during endoscopic procedure.  Patient ID and intended procedure confirmed with present staff. Received instructions for my participation in the procedure from the performing physician.  

## 2018-01-07 NOTE — Patient Instructions (Signed)
YOU HAD AN ENDOSCOPIC PROCEDURE TODAY AT THE  ENDOSCOPY CENTER:   Refer to the procedure report that was given to you for any specific questions about what was found during the examination.  If the procedure report does not answer your questions, please call your gastroenterologist to clarify.  If you requested that your care partner not be given the details of your procedure findings, then the procedure report has been included in a sealed envelope for you to review at your convenience later.  YOU SHOULD EXPECT: Some feelings of bloating in the abdomen. Passage of more gas than usual.  Walking can help get rid of the air that was put into your GI tract during the procedure and reduce the bloating. If you had a lower endoscopy (such as a colonoscopy or flexible sigmoidoscopy) you may notice spotting of blood in your stool or on the toilet paper. If you underwent a bowel prep for your procedure, you may not have a normal bowel movement for a few days.  Please Note:  You might notice some irritation and congestion in your nose or some drainage.  This is from the oxygen used during your procedure.  There is no need for concern and it should clear up in a day or so.  SYMPTOMS TO REPORT IMMEDIATELY:   Following lower endoscopy (colonoscopy or flexible sigmoidoscopy):  Excessive amounts of blood in the stool  Significant tenderness or worsening of abdominal pains  Swelling of the abdomen that is new, acute  Fever of 100F or higher   For urgent or emergent issues, a gastroenterologist can be reached at any hour by calling [phone].   DIET:  We do recommend a small meal at first, but then you may proceed to your regular diet.  Drink plenty of fluids but you should avoid alcoholic beverages for 24 hours. Try to increase the fiber in your diet, and drink plenty of water.  ACTIVITY:  You should plan to take it easy for the rest of today and you should NOT DRIVE or use heavy machinery until  tomorrow (because of the sedation medicines used during the test).    FOLLOW UP: Our staff will call the number listed on your records the next business day following your procedure to check on you and address any questions or concerns that you may have regarding the information given to you following your procedure. If we do not reach you, we will leave a message.  However, if you are feeling well and you are not experiencing any problems, there is no need to return our call.  We will assume that you have returned to your regular daily activities without incident.  If any biopsies were taken you will be contacted by phone or by letter within the next 1-3 weeks.  Please call us at [phone] if you have not heard about the biopsies in 3 weeks.    SIGNATURES/CONFIDENTIALITY: You and/or your care partner have signed paperwork which will be entered into your electronic medical record.  These signatures attest to the fact that that the information above on your After Visit Summary has been reviewed and is understood.  Full responsibility of the confidentiality of this discharge information lies with you and/or your care-partner.  Read all of the handouts given to you by your recovery room nurse. 

## 2018-01-07 NOTE — Progress Notes (Signed)
Pt's states no medical or surgical changes since previsit or office visit. 

## 2018-01-08 ENCOUNTER — Telehealth: Payer: Self-pay

## 2018-01-08 NOTE — Telephone Encounter (Signed)
  Follow up Call-  Call back number 01/07/2018  Post procedure Call Back phone  # [phone]  Permission to leave phone message Yes  Some recent data might be hidden     Patient questions:  Do you have a fever, pain , or abdominal swelling? No. Pain Score  0 *  Have you tolerated food without any problems? Yes.    Have you been able to return to your normal activities? Yes.    Do you have any questions about your discharge instructions: Diet   No. Medications  No. Follow up visit  No.  Do you have questions or concerns about your Care? No.  Actions: * If pain score is 4 or above: No action needed, pain <4.

## 2018-01-22 ENCOUNTER — Encounter: Payer: Self-pay | Admitting: Gastroenterology

## 2018-08-04 ENCOUNTER — Inpatient Hospital Stay (HOSPITAL_COMMUNITY): Admit: 2018-08-04 | Payer: Medicare Other | Admitting: Orthopedic Surgery

## 2018-08-04 ENCOUNTER — Encounter (HOSPITAL_COMMUNITY): Payer: Self-pay

## 2018-08-04 SURGERY — ARTHROPLASTY, HIP, TOTAL, ANTERIOR APPROACH
Anesthesia: Choice | Laterality: Right

## 2018-08-18 NOTE — H&P (Signed)
TOTAL HIP ADMISSION H&P  Patient is admitted for right total hip arthroplasty.  Subjective:  Chief Complaint: right hip pain  HPI: Corey Ward, 68 y.o. male, has a history of pain and functional disability in the right hip(s) due to arthritis and patient has failed non-surgical conservative treatments for greater than 12 weeks to include NSAID's and/or analgesics and activity modification.  Onset of symptoms was gradual starting 5 years ago with gradually worsening course since that time.The patient noted no past surgery on the right hip(s).  Patient currently rates pain in the right hip at 8 out of 10 with activity. Patient has worsening of pain with activity and weight bearing, pain that interfers with activities of daily living and instability. Patient has evidence of severe end-stage arthritis of the right hip with femoral head erosion, large osteophyte formation, and large subchondral cyst formation by imaging studies. This condition presents safety issues increasing the risk of falls. There is no current active infection.  There are no active problems to display for this patient.  Past Medical History:  Diagnosis Date  . Arthritis   . Asthma   . BPH (benign prostatic hyperplasia)   . Gout   . Hyperlipidemia   . OSA (obstructive sleep apnea)   . Sleep apnea   . Urinary retention     Past Surgical History:  Procedure Laterality Date  . INGUINAL HERNIA REPAIR Right   . KNEE SURGERY     arthroscopic  surg  . REPLACEMENT TOTAL KNEE Bilateral   . TENDON REPAIR Right    foot  . TONSILLECTOMY      No current facility-administered medications for this encounter.    Current Outpatient Medications  Medication Sig Dispense Refill Last Dose  . allopurinol (ZYLOPRIM) 300 MG tablet Take 300 mg by mouth daily.   01/06/2018  . colchicine 0.6 MG tablet Take 0.6 mg by mouth daily.   01/06/2018  . ezetimibe-simvastatin (VYTORIN) 10-40 MG per tablet Take 1 tablet by mouth at bedtime.    01/06/2018  . finasteride (PROSCAR) 5 MG tablet Take 5 mg by mouth daily.   01/06/2018  . naproxen (NAPROSYN) 500 MG tablet Take 500 mg by mouth as needed.   01/06/2018  . tamsulosin (FLOMAX) 0.4 MG CAPS    01/06/2018  . traMADol (ULTRAM) 50 MG tablet Take by mouth every 6 (six) hours as needed.   01/06/2018   No Known Allergies  Social History   Tobacco Use  . Smoking status: Never Smoker  . Smokeless tobacco: Never Used  Substance Use Topics  . Alcohol use: No    Family History  Problem Relation Age of Onset  . Heart disease Father   . Colon cancer Neg Hx   . Esophageal cancer Neg Hx   . Rectal cancer Neg Hx   . Stomach cancer Neg Hx      Review of Systems  Constitutional: Negative for chills and fever.  HENT: Negative for congestion, sore throat and tinnitus.   Eyes: Negative for double vision, photophobia and pain.  Respiratory: Negative for cough, shortness of breath and wheezing.   Cardiovascular: Negative for chest pain, palpitations and orthopnea.  Gastrointestinal: Negative for heartburn, nausea and vomiting.  Genitourinary: Negative for dysuria, frequency and urgency.  Musculoskeletal: Positive for joint pain.  Neurological: Negative for dizziness, weakness and headaches.    Objective:  Physical Exam  Well nourished and well developed.  General: Alert and oriented x3, cooperative and pleasant, no acute distress.  Head: normocephalic,  atraumatic, neck supple.  Eyes: EOMI.  Respiratory: breath sounds clear in all fields, no wheezing, rales, or rhonchi. Cardiovascular: Regular rate and rhythm, no murmurs, gallops or rubs.  Abdomen: non-tender to palpation and soft, normoactive bowel sounds. Musculoskeletal:  Right Hip Exam: ROM: Flexion to 90, Internal Rotation 0, External Rotation 10, and Abduction 10 degrees.  There is no tenderness over the greater trochanter bursa.  There is no pain on provocative testing of the hip.  Calves soft and nontender. Motor  function intact in LE. Strength 5/5 LE bilaterally. Neuro: Distal pulses 2+. Sensation to light touch intact in LE.  Vital signs in last 24 hours: Blood pressure: 130/80 mmHg Pulse: 56 bpm  Labs:   Estimated body mass index is 34.08 kg/m as calculated from the following:   Height as of 01/07/18: 6\' 4"  (1.93 m).   Weight as of 01/07/18: 127 kg.   Imaging Review Plain radiographs demonstrate severe degenerative joint disease of the right hip(s). The bone quality appears to be adequate for age and reported activity level.   Assessment/Plan:  End stage arthritis, right hip(s)  The patient history, physical examination, clinical judgement of the provider and imaging studies are consistent with end stage degenerative joint disease of the right hip(s) and total hip arthroplasty is deemed medically necessary. The treatment options including medical management, injection therapy, arthroscopy and arthroplasty were discussed at length. The risks and benefits of total hip arthroplasty were presented and reviewed. The risks due to aseptic loosening, infection, stiffness, dislocation/subluxation,  thromboembolic complications and other imponderables were discussed.  The patient acknowledged the explanation, agreed to proceed with the plan and consent was signed. Patient is being admitted for inpatient treatment for surgery, pain control, PT, OT, prophylactic antibiotics, VTE prophylaxis, progressive ambulation and ADL's and discharge planning.The patient is planning to be discharged home.  Anticipated LOS equal to or greater than 2 midnights due to - Age 31 and older with one or more of the following:  - Obesity  - Expected need for hospital services (PT, OT, Nursing) required for safe  discharge  - Anticipated need for postoperative skilled nursing care or inpatient rehab  - Active co-morbidities: None OR   - Unanticipated findings during/Post Surgery: None  - Patient is a high risk of  re-admission due to: None  Therapy Plans: HEP Disposition: Home with wife Planned DVT Prophylaxis: Aspirin 325 mg BID DME needed: None PCP: Dr. Joylene Draft TXA: IV Allergies: NKDA Anesthesia Concerns: Nausea/vomiting, sleep apnea BMI: 32.9  - Patient was instructed on what medications to stop prior to surgery. - Follow-up visit in 2 weeks with Dr. Wynelle Link - Begin physical therapy following surgery - Pre-operative lab work as pre-surgical testing - Prescriptions will be provided in hospital at time of discharge  Theresa Duty, PA-C Orthopedic Surgery [facility] [place]

## 2018-09-01 NOTE — Patient Instructions (Addendum)
[name]    Your procedure is scheduled on: 09-08-2018  Report to [facility] Main  Entrance  Report to admitting at  1045 AM   YOU NEED TO HAVE A COVID 19 TEST ON_______ @_______ , THIS TEST MUST BE DONE BEFORE SURGERY, COME TO [place]. ONCE YOUR COVID TEST IS COMPLETED, PLEASE BEGIN THE QUARANTINE INSTRUCTIONS AS OUTLINED IN YOUR HANDOUT.  BRING CPAP MASK AND TUBING  Call this number if you have problems the morning of surgery [phone]    Remember:  [place] AND RINSE YOUR MOUTH OUT, NO CHEWING GUM CANDY OR MINTS.  NO SOLID FOOD AFTER MIDNIGHT THE NIGHT PRIOR TO SURGERY. NOTHING BY MOUTH EXCEPT CLEAR LIQUIDS UNTIL 1015 AM. . PLEASE FINISH ENSURE DRINK PER SURGEON ORDER 3 HOURS PRIOR TO SCHEDULED SURGERY TIME WHICH NEEDS TO BE COMPLETED AT 1015 AM.    CLEAR LIQUID DIET   Foods Allowed                                                                     Foods Excluded  Coffee and tea, regular and decaf                             liquids that you cannot  Plain Jell-O in any flavor                                             see through such as: Fruit ices (not with fruit pulp)                                     milk, soups, orange juice  Iced Popsicles                                    All solid food Carbonated beverages, regular and diet                                    Cranberry, grape and apple juices Sports drinks like Gatorade Lightly seasoned clear broth or consume(fat free) Sugar, honey syrup  Sample Menu Breakfast                                Lunch                                     Supper Cranberry juice                    Beef broth  Chicken broth Jell-O                                     Grape juice                           Apple juice Coffee or tea                        Jell-O                                      Popsicle                                   Coffee or tea                        Coffee or tea  _____________________________________________________________________    Take these medicines the morning of surgery with A SIP OF WATER: NONE              You may not have any metal on your body including hair pins and              piercings  Do not wear jewelry, lotions, powders or perfumes, deodorant             Do not wear nail polish.  Do not shave  48 hours prior to surgery.              Men may shave face and neck.   Do not bring valuables to the hospital. [place].  Contacts, dentures or bridgework may not be worn into surgery.  Leave suitcase in the car. After surgery it may be brought to your room.      _____________________________________________________________________             [facility] - Preparing for Surgery Before surgery, you can play an important role.  Because skin is not sterile, your skin needs to be as free of germs as possible.  You can reduce the number of germs on your skin by washing with CHG (chlorahexidine gluconate) soap before surgery.  CHG is an antiseptic cleaner which kills germs and bonds with the skin to continue killing germs even after washing. Please DO NOT use if you have an allergy to CHG or antibacterial soaps.  If your skin becomes reddened/irritated stop using the CHG and inform your nurse when you arrive at Short Stay. Do not shave (including legs and underarms) for at least 48 hours prior to the first CHG shower.  You may shave your face/neck. Please follow these instructions carefully:  1.  Shower with CHG Soap the night before surgery and the  morning of Surgery.  2.  If you choose to wash your hair, wash your hair first as usual with your  normal  shampoo.  3.  After you shampoo, rinse your hair and body thoroughly to remove the  shampoo.  4.  Use CHG as you would any other  liquid soap.  You can apply chg directly  to the skin and wash                       Gently with a scrungie or clean washcloth.  5.  Apply the CHG Soap to your body ONLY FROM THE NECK DOWN.   Do not use on face/ open                           Wound or open sores. Avoid contact with eyes, ears mouth and genitals (private parts).                       Wash face,  Genitals (private parts) with your normal soap.             6.  Wash thoroughly, paying special attention to the area where your surgery  will be performed.  7.  Thoroughly rinse your body with warm water from the neck down.  8.  DO NOT shower/wash with your normal soap after using and rinsing off  the CHG Soap.                9.  Pat yourself dry with a clean towel.            10.  Wear clean pajamas.            11.  Place clean sheets on your bed the night of your first shower and do not  sleep with pets. Day of Surgery : Do not apply any lotions/deodorants the morning of surgery.  Please wear clean clothes to the hospital/surgery center.  FAILURE TO FOLLOW THESE INSTRUCTIONS MAY RESULT IN THE CANCELLATION OF YOUR SURGERY PATIENT SIGNATURE_________________________________  NURSE SIGNATURE__________________________________  ________________________________________________________________________   Adam Phenix  An incentive spirometer is a tool that can help keep your lungs clear and active. This tool measures how well you are filling your lungs with each breath. Taking long deep breaths may help reverse or decrease the chance of developing breathing (pulmonary) problems (especially infection) following:  A long period of time when you are unable to move or be active. BEFORE THE PROCEDURE   If the spirometer includes an indicator to show your best effort, your nurse or respiratory therapist will set it to a desired goal.  If possible, sit up straight or lean slightly forward. Try not to slouch.  Hold the incentive  spirometer in an upright position. INSTRUCTIONS FOR USE  1. Sit on the edge of your bed if possible, or sit up as far as you can in bed or on a chair. 2. Hold the incentive spirometer in an upright position. 3. Breathe out normally. 4. Place the mouthpiece in your mouth and seal your lips tightly around it. 5. Breathe in slowly and as deeply as possible, raising the piston or the ball toward the top of the column. 6. Hold your breath for 3-5 seconds or for as long as possible. Allow the piston or ball to fall to the bottom of the column. 7. Remove the mouthpiece from your mouth and breathe out normally. 8. Rest for a few seconds and repeat Steps 1 through 7 at least 10 times every 1-2 hours when you are awake. Take your time and take a few normal breaths between deep breaths. 9. The spirometer may include an indicator to  show your best effort. Use the indicator as a goal to work toward during each repetition. 10. After each set of 10 deep breaths, practice coughing to be sure your lungs are clear. If you have an incision (the cut made at the time of surgery), support your incision when coughing by placing a pillow or rolled up towels firmly against it. Once you are able to get out of bed, walk around indoors and cough well. You may stop using the incentive spirometer when instructed by your caregiver.  RISKS AND COMPLICATIONS  Take your time so you do not get dizzy or light-headed.  If you are in pain, you may need to take or ask for pain medication before doing incentive spirometry. It is harder to take a deep breath if you are having pain. AFTER USE  Rest and breathe slowly and easily.  It can be helpful to keep track of a log of your progress. Your caregiver can provide you with a simple table to help with this. If you are using the spirometer at home, follow these instructions: [place] IF:   You are having difficultly using the spirometer.  You have trouble using the  spirometer as often as instructed.  Your pain medication is not giving enough relief while using the spirometer.  You develop fever of 100.5 F (38.1 C) or higher. SEEK IMMEDIATE MEDICAL CARE IF:   You cough up bloody sputum that had not been present before.  You develop fever of 102 F (38.9 C) or greater.  You develop worsening pain at or near the incision site. MAKE SURE YOU:   Understand these instructions.  Will watch your condition.  Will get help right away if you are not doing well or get worse. Document Released: 07/14/2006 Document Revised: 05/26/2011 Document Reviewed: 09/14/2006 ExitCare Patient Information 2014 ExitCare, [place].   ________________________________________________________________________  WHAT IS A BLOOD TRANSFUSION? Blood Transfusion Information  A transfusion is the replacement of blood or some of its parts. Blood is made up of multiple cells which provide different functions.  Red blood cells carry oxygen and are used for blood loss replacement.  White blood cells fight against infection.  Platelets control bleeding.  Plasma helps clot blood.  Other blood products are available for specialized needs, such as hemophilia or other clotting disorders. BEFORE THE TRANSFUSION  Who gives blood for transfusions?   Healthy volunteers who are fully evaluated to make sure their blood is safe. This is blood bank blood. Transfusion therapy is the safest it has ever been in the practice of medicine. Before blood is taken from a donor, a complete history is taken to make sure that person has no history of diseases nor engages in risky social behavior (examples are intravenous drug use or sexual activity with multiple partners). The donor's travel history is screened to minimize risk of transmitting infections, such as malaria. The donated blood is tested for signs of infectious diseases, such as HIV and hepatitis. The blood is then tested to be sure it is  compatible with you in order to minimize the chance of a transfusion reaction. If you or a relative donates blood, this is often done in anticipation of surgery and is not appropriate for emergency situations. It takes many days to process the donated blood. RISKS AND COMPLICATIONS Although transfusion therapy is very safe and saves many lives, the main dangers of transfusion include:   Getting an infectious disease.  Developing a transfusion reaction. This is an allergic reaction  to something in the blood you were given. Every precaution is taken to prevent this. The decision to have a blood transfusion has been considered carefully by your caregiver before blood is given. Blood is not given unless the benefits outweigh the risks. AFTER THE TRANSFUSION  Right after receiving a blood transfusion, you will usually feel much better and more energetic. This is especially true if your red blood cells have gotten low (anemic). The transfusion raises the level of the red blood cells which carry oxygen, and this usually causes an energy increase.  The nurse administering the transfusion will monitor you carefully for complications. HOME CARE INSTRUCTIONS  No special instructions are needed after a transfusion. You may find your energy is better. Speak with your caregiver about any limitations on activity for underlying diseases you may have. SEEK MEDICAL CARE IF:   Your condition is not improving after your transfusion.  You develop redness or irritation at the intravenous (IV) site. SEEK IMMEDIATE MEDICAL CARE IF:  Any of the following symptoms occur over the next 12 hours:  Shaking chills.  You have a temperature by mouth above 102 F (38.9 C), not controlled by medicine.  Chest, back, or muscle pain.  People around you feel you are not acting correctly or are confused.  Shortness of breath or difficulty breathing.  Dizziness and fainting.  You get a rash or develop hives.  You have  a decrease in urine output.  Your urine turns a dark color or changes to pink, red, or brown. Any of the following symptoms occur over the next 10 days:  You have a temperature by mouth above 102 F (38.9 C), not controlled by medicine.  Shortness of breath.  Weakness after normal activity.  The white part of the eye turns yellow (jaundice).  You have a decrease in the amount of urine or are urinating less often.  Your urine turns a dark color or changes to pink, red, or brown. Document Released: 02/29/2000 Document Revised: 05/26/2011 Document Reviewed: 10/18/2007 [facility] Patient Information 2014 [place], [place].  _______________________________________________________________________

## 2018-09-02 ENCOUNTER — Encounter (HOSPITAL_COMMUNITY): Payer: Self-pay

## 2018-09-02 ENCOUNTER — Other Ambulatory Visit: Payer: Self-pay

## 2018-09-02 ENCOUNTER — Encounter (HOSPITAL_COMMUNITY)
Admission: RE | Admit: 2018-09-02 | Discharge: 2018-09-02 | Disposition: A | Discharge status: Home / Self Care | Payer: Medicare Other | Source: Ambulatory Visit | Attending: Orthopedic Surgery | Admitting: Orthopedic Surgery

## 2018-09-02 DIAGNOSIS — M1611 Unilateral primary osteoarthritis, right hip: Secondary | ICD-10-CM | POA: Diagnosis not present

## 2018-09-02 DIAGNOSIS — R001 Bradycardia, unspecified: Secondary | ICD-10-CM | POA: Insufficient documentation

## 2018-09-02 DIAGNOSIS — Z01818 Encounter for other preprocedural examination: Secondary | ICD-10-CM | POA: Diagnosis present

## 2018-09-02 HISTORY — DX: Nausea with vomiting, unspecified: R11.2

## 2018-09-02 HISTORY — DX: Other specified postprocedural states: Z98.890

## 2018-09-02 HISTORY — DX: Prediabetes: R73.03

## 2018-09-02 LAB — COMPREHENSIVE METABOLIC PANEL
ALT: 14 U/L (ref 0–44)
AST: 19 U/L (ref 15–41)
Albumin: 4.5 g/dL (ref 3.5–5.0)
Alkaline Phosphatase: 63 U/L (ref 38–126)
Anion gap: 8 (ref 5–15)
BUN: 20 mg/dL (ref 8–23)
CO2: 24 mmol/L (ref 22–32)
Calcium: 9.2 mg/dL (ref 8.9–10.3)
Chloride: 107 mmol/L (ref 98–111)
Creatinine, Ser: 0.73 mg/dL (ref 0.61–1.24)
GFR calc Af Amer: >60 mL/min (ref >60)
GFR calc non Af Amer: >60 mL/min (ref >60)
Glucose, Bld: 127 mg/dL — ABNORMAL HIGH (ref 70–99)
Potassium: 4.4 mmol/L (ref 3.5–5.1)
Sodium: 139 mmol/L (ref 135–145)
Total Bilirubin: 0.7 mg/dL (ref 0.3–1.2)
Total Protein: 7.5 g/dL (ref 6.5–8.1)

## 2018-09-02 LAB — SURGICAL PCR SCREEN
MRSA, PCR: NEGATIVE
Staphylococcus aureus: NEGATIVE

## 2018-09-02 LAB — CBC
HCT: 42.1 % (ref 39.0–52.0)
Hemoglobin: 13.4 g/dL (ref 13.0–17.0)
MCH: 30.5 pg (ref 26.0–34.0)
MCHC: 31.8 g/dL (ref 30.0–36.0)
MCV: 95.7 fL (ref 80.0–100.0)
Platelets: 174 10*3/uL (ref 150–400)
RBC: 4.4 MIL/uL (ref 4.22–5.81)
RDW: 12.7 % (ref 11.5–15.5)
WBC: 5.9 10*3/uL (ref 4.0–10.5)
nRBC: 0 % (ref 0.0–0.2)

## 2018-09-02 LAB — HEMOGLOBIN A1C
Hgb A1c MFr Bld: 6.4 % — ABNORMAL HIGH (ref 4.8–5.6)
Mean Plasma Glucose: 136.98 mg/dL

## 2018-09-02 LAB — PROTIME-INR
INR: 1 (ref 0.8–1.2)
Prothrombin Time: 12.5 seconds (ref 11.4–15.2)

## 2018-09-02 LAB — APTT: aPTT: 31 seconds (ref 24–36)

## 2018-09-04 ENCOUNTER — Other Ambulatory Visit (HOSPITAL_COMMUNITY)
Admission: RE | Admit: 2018-09-04 | Discharge: 2018-09-04 | Disposition: A | Discharge status: Home / Self Care | Payer: Medicare Other | Source: Ambulatory Visit | Attending: Orthopedic Surgery | Admitting: Orthopedic Surgery

## 2018-09-04 DIAGNOSIS — Z01818 Encounter for other preprocedural examination: Secondary | ICD-10-CM | POA: Diagnosis not present

## 2018-09-05 LAB — SARS CORONAVIRUS 2 (TAT 6-24 HRS): SARS Coronavirus 2: NEGATIVE

## 2018-09-07 MED ORDER — DEXTROSE 5 % IV SOLN
3.0000 g | INTRAVENOUS | Status: AC
  Administered 2018-09-08: 3 g via INTRAVENOUS
  Filled 2018-09-07: qty 3

## 2018-09-08 ENCOUNTER — Inpatient Hospital Stay (HOSPITAL_COMMUNITY): Payer: Medicare Other

## 2018-09-08 ENCOUNTER — Inpatient Hospital Stay (HOSPITAL_COMMUNITY): Payer: Medicare Other | Admitting: Physician Assistant

## 2018-09-08 ENCOUNTER — Other Ambulatory Visit: Payer: Self-pay

## 2018-09-08 ENCOUNTER — Inpatient Hospital Stay (HOSPITAL_COMMUNITY)
Admission: RE | Admit: 2018-09-08 | Discharge: 2018-09-09 | DRG: 470 | Disposition: A | Discharge status: Home / Self Care | Payer: Medicare Other | Attending: Orthopedic Surgery | Admitting: Orthopedic Surgery

## 2018-09-08 ENCOUNTER — Encounter (HOSPITAL_COMMUNITY)
Admission: RE | Disposition: A | Discharge status: Home / Self Care | Payer: Self-pay | Source: Home / Self Care | Attending: Orthopedic Surgery

## 2018-09-08 ENCOUNTER — Encounter (HOSPITAL_COMMUNITY): Payer: Self-pay | Admitting: *Deleted

## 2018-09-08 ENCOUNTER — Inpatient Hospital Stay (HOSPITAL_COMMUNITY): Payer: Medicare Other | Admitting: Certified Registered"

## 2018-09-08 DIAGNOSIS — M109 Gout, unspecified: Secondary | ICD-10-CM | POA: Diagnosis present

## 2018-09-08 DIAGNOSIS — Z96649 Presence of unspecified artificial hip joint: Secondary | ICD-10-CM

## 2018-09-08 DIAGNOSIS — M169 Osteoarthritis of hip, unspecified: Secondary | ICD-10-CM

## 2018-09-08 DIAGNOSIS — Z79891 Long term (current) use of opiate analgesic: Secondary | ICD-10-CM | POA: Diagnosis not present

## 2018-09-08 DIAGNOSIS — Z7983 Long term (current) use of bisphosphonates: Secondary | ICD-10-CM

## 2018-09-08 DIAGNOSIS — E669 Obesity, unspecified: Secondary | ICD-10-CM | POA: Diagnosis present

## 2018-09-08 DIAGNOSIS — Z1159 Encounter for screening for other viral diseases: Secondary | ICD-10-CM | POA: Diagnosis not present

## 2018-09-08 DIAGNOSIS — M1611 Unilateral primary osteoarthritis, right hip: Principal | ICD-10-CM | POA: Diagnosis present

## 2018-09-08 DIAGNOSIS — Z79899 Other long term (current) drug therapy: Secondary | ICD-10-CM | POA: Diagnosis not present

## 2018-09-08 DIAGNOSIS — Z96653 Presence of artificial knee joint, bilateral: Secondary | ICD-10-CM | POA: Diagnosis present

## 2018-09-08 DIAGNOSIS — N400 Enlarged prostate without lower urinary tract symptoms: Secondary | ICD-10-CM | POA: Diagnosis present

## 2018-09-08 DIAGNOSIS — Z6834 Body mass index (BMI) 34.0-34.9, adult: Secondary | ICD-10-CM | POA: Diagnosis not present

## 2018-09-08 DIAGNOSIS — Z8249 Family history of ischemic heart disease and other diseases of the circulatory system: Secondary | ICD-10-CM

## 2018-09-08 DIAGNOSIS — M25751 Osteophyte, right hip: Secondary | ICD-10-CM | POA: Diagnosis present

## 2018-09-08 DIAGNOSIS — G4733 Obstructive sleep apnea (adult) (pediatric): Secondary | ICD-10-CM | POA: Diagnosis present

## 2018-09-08 DIAGNOSIS — E785 Hyperlipidemia, unspecified: Secondary | ICD-10-CM | POA: Diagnosis present

## 2018-09-08 DIAGNOSIS — Z419 Encounter for procedure for purposes other than remedying health state, unspecified: Secondary | ICD-10-CM

## 2018-09-08 HISTORY — PX: TOTAL HIP ARTHROPLASTY: SHX124

## 2018-09-08 LAB — TYPE AND SCREEN
ABO/RH(D): O POS
Antibody Screen: NEGATIVE

## 2018-09-08 SURGERY — ARTHROPLASTY, HIP, TOTAL, ANTERIOR APPROACH
Anesthesia: General | Laterality: Right

## 2018-09-08 MED ORDER — METOCLOPRAMIDE HCL 5 MG PO TABS: 5.0000 mg | ORAL_TABLET | Freq: Three times a day (TID) | ORAL | Status: DC | PRN

## 2018-09-08 MED ORDER — PROPOFOL 10 MG/ML IV BOLUS
INTRAVENOUS | Status: AC
  Filled 2018-09-08: qty 20

## 2018-09-08 MED ORDER — HYDROCODONE-ACETAMINOPHEN 5-325 MG PO TABS
1.0000 | ORAL_TABLET | ORAL | Status: DC | PRN
  Administered 2018-09-08: 1 via ORAL
  Administered 2018-09-08: 2 via ORAL
  Administered 2018-09-09: 1 via ORAL
  Administered 2018-09-09: 2 via ORAL
  Filled 2018-09-08 (×4): qty 2
  Filled 2018-09-08: qty 1

## 2018-09-08 MED ORDER — SODIUM CHLORIDE 0.9 % IR SOLN
Status: DC | PRN
  Administered 2018-09-08: 1000 mL

## 2018-09-08 MED ORDER — BISACODYL 10 MG RE SUPP: 10.0000 mg | Freq: Every day | RECTAL | Status: DC | PRN

## 2018-09-08 MED ORDER — COLCHICINE 0.6 MG PO TABS
0.6000 mg | ORAL_TABLET | Freq: Every day | ORAL | Status: DC
  Administered 2018-09-08: 0.6 mg via ORAL
  Filled 2018-09-08: qty 1

## 2018-09-08 MED ORDER — ACETAMINOPHEN 500 MG PO TABS
500.0000 mg | ORAL_TABLET | Freq: Four times a day (QID) | ORAL | Status: DC
  Administered 2018-09-08 – 2018-09-09 (×3): 500 mg via ORAL
  Filled 2018-09-08 (×3): qty 1

## 2018-09-08 MED ORDER — LACTATED RINGERS IV SOLN
INTRAVENOUS | Status: DC
  Administered 2018-09-08: 11:00:00 via INTRAVENOUS

## 2018-09-08 MED ORDER — CHLORHEXIDINE GLUCONATE 4 % EX LIQD
60.0000 mL | Freq: Once | CUTANEOUS | Status: AC
  Administered 2018-09-08: 4 via TOPICAL

## 2018-09-08 MED ORDER — FINASTERIDE 5 MG PO TABS
5.0000 mg | ORAL_TABLET | Freq: Every evening | ORAL | Status: DC
  Administered 2018-09-08: 5 mg via ORAL
  Filled 2018-09-08: qty 1

## 2018-09-08 MED ORDER — POLYETHYLENE GLYCOL 3350 17 G PO PACK: 17.0000 g | PACK | Freq: Every day | ORAL | Status: DC | PRN

## 2018-09-08 MED ORDER — TRANEXAMIC ACID-NACL 1000-0.7 MG/100ML-% IV SOLN: 1000.0000 mg | Freq: Once | INTRAVENOUS | Status: DC

## 2018-09-08 MED ORDER — MEPERIDINE HCL 50 MG/ML IJ SOLN: 6.2500 mg | INTRAMUSCULAR | Status: DC | PRN

## 2018-09-08 MED ORDER — ONDANSETRON HCL 4 MG/2ML IJ SOLN: 4.0000 mg | Freq: Four times a day (QID) | INTRAMUSCULAR | Status: DC | PRN

## 2018-09-08 MED ORDER — ASPIRIN EC 325 MG PO TBEC
325.0000 mg | DELAYED_RELEASE_TABLET | Freq: Two times a day (BID) | ORAL | Status: DC
  Administered 2018-09-09: 325 mg via ORAL
  Filled 2018-09-08: qty 1

## 2018-09-08 MED ORDER — SODIUM CHLORIDE 0.9 % IV SOLN
INTRAVENOUS | Status: DC | PRN
  Administered 2018-09-08: 25 ug/min via INTRAVENOUS

## 2018-09-08 MED ORDER — TAMSULOSIN HCL 0.4 MG PO CAPS
0.4000 mg | ORAL_CAPSULE | Freq: Every evening | ORAL | Status: DC
  Administered 2018-09-08: 0.4 mg via ORAL
  Filled 2018-09-08: qty 1

## 2018-09-08 MED ORDER — MENTHOL 3 MG MT LOZG: 1.0000 | LOZENGE | OROMUCOSAL | Status: DC | PRN

## 2018-09-08 MED ORDER — PROPOFOL 10 MG/ML IV BOLUS
INTRAVENOUS | Status: DC | PRN
  Administered 2018-09-08: 30 mg via INTRAVENOUS
  Administered 2018-09-08: 20 mg via INTRAVENOUS

## 2018-09-08 MED ORDER — ACETAMINOPHEN 10 MG/ML IV SOLN
1000.0000 mg | Freq: Four times a day (QID) | INTRAVENOUS | Status: DC
  Administered 2018-09-08: 1000 mg via INTRAVENOUS
  Filled 2018-09-08: qty 100

## 2018-09-08 MED ORDER — PROPOFOL 500 MG/50ML IV EMUL
INTRAVENOUS | Status: DC | PRN
  Administered 2018-09-08: 135 ug/kg/min via INTRAVENOUS

## 2018-09-08 MED ORDER — MAGNESIUM CITRATE PO SOLN: 1.0000 | Freq: Once | ORAL | Status: DC | PRN

## 2018-09-08 MED ORDER — BUPIVACAINE IN DEXTROSE 0.75-8.25 % IT SOLN
INTRATHECAL | Status: DC | PRN
  Administered 2018-09-08: 2 mL via INTRATHECAL

## 2018-09-08 MED ORDER — PROPOFOL 10 MG/ML IV BOLUS
INTRAVENOUS | Status: AC
  Filled 2018-09-08: qty 80

## 2018-09-08 MED ORDER — DOCUSATE SODIUM 100 MG PO CAPS
100.0000 mg | ORAL_CAPSULE | Freq: Two times a day (BID) | ORAL | Status: DC
  Administered 2018-09-08 – 2018-09-09 (×2): 100 mg via ORAL
  Filled 2018-09-08 (×2): qty 1

## 2018-09-08 MED ORDER — TRANEXAMIC ACID-NACL 1000-0.7 MG/100ML-% IV SOLN
1000.0000 mg | Freq: Once | INTRAVENOUS | Status: AC
  Administered 2018-09-08: 1000 mg via INTRAVENOUS
  Filled 2018-09-08: qty 100

## 2018-09-08 MED ORDER — SODIUM CHLORIDE 0.9 % IV SOLN
INTRAVENOUS | Status: DC
  Administered 2018-09-08: 17:00:00 via INTRAVENOUS

## 2018-09-08 MED ORDER — MORPHINE SULFATE (PF) 2 MG/ML IV SOLN
0.5000 mg | INTRAVENOUS | Status: DC | PRN
  Administered 2018-09-08: 1 mg via INTRAVENOUS
  Filled 2018-09-08: qty 1

## 2018-09-08 MED ORDER — METHOCARBAMOL 500 MG IVPB - SIMPLE MED
500.0000 mg | Freq: Four times a day (QID) | INTRAVENOUS | Status: DC | PRN
  Filled 2018-09-08: qty 50

## 2018-09-08 MED ORDER — PHENYLEPHRINE HCL (PRESSORS) 10 MG/ML IV SOLN
INTRAVENOUS | Status: AC
  Filled 2018-09-08: qty 1

## 2018-09-08 MED ORDER — METOCLOPRAMIDE HCL 5 MG/ML IJ SOLN: 5.0000 mg | Freq: Three times a day (TID) | INTRAMUSCULAR | Status: DC | PRN

## 2018-09-08 MED ORDER — BUPIVACAINE-EPINEPHRINE (PF) 0.25% -1:200000 IJ SOLN
INTRAMUSCULAR | Status: AC
  Filled 2018-09-08: qty 30

## 2018-09-08 MED ORDER — CEFAZOLIN SODIUM-DEXTROSE 2-4 GM/100ML-% IV SOLN
2.0000 g | Freq: Four times a day (QID) | INTRAVENOUS | Status: AC
  Administered 2018-09-08 – 2018-09-09 (×2): 2 g via INTRAVENOUS
  Filled 2018-09-08 (×2): qty 100

## 2018-09-08 MED ORDER — ONDANSETRON HCL 4 MG/2ML IJ SOLN
INTRAMUSCULAR | Status: DC | PRN
  Administered 2018-09-08: 4 mg via INTRAVENOUS

## 2018-09-08 MED ORDER — EZETIMIBE-SIMVASTATIN 10-40 MG PO TABS
1.0000 | ORAL_TABLET | Freq: Every day | ORAL | Status: DC
  Administered 2018-09-08: 1 via ORAL
  Filled 2018-09-08: qty 1

## 2018-09-08 MED ORDER — METHOCARBAMOL 500 MG PO TABS
500.0000 mg | ORAL_TABLET | Freq: Four times a day (QID) | ORAL | Status: DC | PRN
  Administered 2018-09-08 – 2018-09-09 (×3): 500 mg via ORAL
  Filled 2018-09-08 (×3): qty 1

## 2018-09-08 MED ORDER — MORPHINE SULFATE (PF) 2 MG/ML IV SOLN: 1.0000 mg | INTRAVENOUS | Status: DC | PRN

## 2018-09-08 MED ORDER — EPHEDRINE 5 MG/ML INJ
INTRAVENOUS | Status: AC
  Filled 2018-09-08: qty 10

## 2018-09-08 MED ORDER — TRAMADOL HCL 50 MG PO TABS
50.0000 mg | ORAL_TABLET | Freq: Four times a day (QID) | ORAL | Status: DC | PRN
  Administered 2018-09-09: 50 mg via ORAL
  Filled 2018-09-08: qty 1

## 2018-09-08 MED ORDER — ALLOPURINOL 300 MG PO TABS
300.0000 mg | ORAL_TABLET | Freq: Every day | ORAL | Status: DC
  Administered 2018-09-08: 300 mg via ORAL
  Filled 2018-09-08: qty 1

## 2018-09-08 MED ORDER — DEXAMETHASONE SODIUM PHOSPHATE 10 MG/ML IJ SOLN
10.0000 mg | Freq: Once | INTRAMUSCULAR | Status: AC
  Administered 2018-09-09: 10 mg via INTRAVENOUS
  Filled 2018-09-08: qty 1

## 2018-09-08 MED ORDER — DEXAMETHASONE SODIUM PHOSPHATE 10 MG/ML IJ SOLN
INTRAMUSCULAR | Status: DC | PRN
  Administered 2018-09-08: 8 mg via INTRAVENOUS

## 2018-09-08 MED ORDER — HYDROMORPHONE HCL 1 MG/ML IJ SOLN: 0.2500 mg | INTRAMUSCULAR | Status: DC | PRN

## 2018-09-08 MED ORDER — ONDANSETRON HCL 4 MG/2ML IJ SOLN: 4.0000 mg | Freq: Once | INTRAMUSCULAR | Status: DC | PRN

## 2018-09-08 MED ORDER — ONDANSETRON HCL 4 MG/2ML IJ SOLN
INTRAMUSCULAR | Status: AC
  Filled 2018-09-08: qty 2

## 2018-09-08 MED ORDER — ONDANSETRON HCL 4 MG PO TABS: 4.0000 mg | ORAL_TABLET | Freq: Four times a day (QID) | ORAL | Status: DC | PRN

## 2018-09-08 MED ORDER — POVIDONE-IODINE 10 % EX SWAB
2.0000 "application " | Freq: Once | CUTANEOUS | Status: AC
  Administered 2018-09-08: 2 via TOPICAL

## 2018-09-08 MED ORDER — PHENOL 1.4 % MT LIQD
1.0000 | OROMUCOSAL | Status: DC | PRN
  Filled 2018-09-08: qty 177

## 2018-09-08 MED ORDER — TRANEXAMIC ACID-NACL 1000-0.7 MG/100ML-% IV SOLN
1000.0000 mg | INTRAVENOUS | Status: AC
  Administered 2018-09-08: 1000 mg via INTRAVENOUS
  Filled 2018-09-08: qty 100

## 2018-09-08 MED ORDER — DEXAMETHASONE SODIUM PHOSPHATE 10 MG/ML IJ SOLN
INTRAMUSCULAR | Status: AC
  Filled 2018-09-08: qty 1

## 2018-09-08 MED ORDER — EPHEDRINE SULFATE-NACL 50-0.9 MG/10ML-% IV SOSY
PREFILLED_SYRINGE | INTRAVENOUS | Status: DC | PRN
  Administered 2018-09-08 (×3): 5 mg via INTRAVENOUS

## 2018-09-08 SURGICAL SUPPLY — 46 items
BAG DECANTER FOR FLEXI CONT (MISCELLANEOUS) IMPLANT
BAG SPEC THK2 15X12 ZIP CLS (MISCELLANEOUS)
BAG ZIPLOCK 12X15 (MISCELLANEOUS) IMPLANT
BLADE SAG 18X100X1.27 (BLADE) ×3 IMPLANT
CLOSURE WOUND 1/2 X4 (GAUZE/BANDAGES/DRESSINGS) ×2
COVER PERINEAL POST (MISCELLANEOUS) ×3 IMPLANT
COVER SURGICAL LIGHT HANDLE (MISCELLANEOUS) ×3 IMPLANT
COVER WAND RF STERILE (DRAPES) IMPLANT
CUP ACETBLR 54 OD PINNACLE (Hips) ×2 IMPLANT
DECANTER SPIKE VIAL GLASS SM (MISCELLANEOUS) ×3 IMPLANT
DRAPE STERI IOBAN 125X83 (DRAPES) ×3 IMPLANT
DRAPE U-SHAPE 47X51 STRL (DRAPES) ×6 IMPLANT
DRSG ADAPTIC 3X8 NADH LF (GAUZE/BANDAGES/DRESSINGS) ×3 IMPLANT
DRSG MEPILEX BORDER 4X4 (GAUZE/BANDAGES/DRESSINGS) ×3 IMPLANT
DRSG MEPILEX BORDER 4X8 (GAUZE/BANDAGES/DRESSINGS) ×3 IMPLANT
DURAPREP 26ML APPLICATOR (WOUND CARE) ×3 IMPLANT
ELECT REM PT RETURN 15FT ADLT (MISCELLANEOUS) ×3 IMPLANT
EVACUATOR 1/8 PVC DRAIN (DRAIN) ×3 IMPLANT
GLOVE BIO SURGEON STRL SZ 6 (GLOVE) IMPLANT
GLOVE BIO SURGEON STRL SZ7 (GLOVE) IMPLANT
GLOVE BIO SURGEON STRL SZ8 (GLOVE) ×3 IMPLANT
GLOVE BIOGEL PI IND STRL 6.5 (GLOVE) IMPLANT
GLOVE BIOGEL PI IND STRL 7.0 (GLOVE) IMPLANT
GLOVE BIOGEL PI IND STRL 8 (GLOVE) ×1 IMPLANT
GLOVE BIOGEL PI INDICATOR 6.5 (GLOVE)
GLOVE BIOGEL PI INDICATOR 7.0 (GLOVE)
GLOVE BIOGEL PI INDICATOR 8 (GLOVE) ×2
GOWN STRL REUS W/TWL LRG LVL3 (GOWN DISPOSABLE) ×3 IMPLANT
GOWN STRL REUS W/TWL XL LVL3 (GOWN DISPOSABLE) IMPLANT
HEAD CERAMIC DELTA 36 PLUS 1.5 (Hips) ×2 IMPLANT
HOLDER FOLEY CATH W/STRAP (MISCELLANEOUS) ×3 IMPLANT
KIT TURNOVER KIT A (KITS) IMPLANT
LINER MARATHON NEUT +4X54X36 (Hips) ×2 IMPLANT
MANIFOLD NEPTUNE II (INSTRUMENTS) ×3 IMPLANT
PACK ANTERIOR HIP CUSTOM (KITS) ×3 IMPLANT
STEM FEMORAL SZ 6MM STD ACTIS (Stem) ×2 IMPLANT
STRIP CLOSURE SKIN 1/2X4 (GAUZE/BANDAGES/DRESSINGS) ×3 IMPLANT
SUT ETHIBOND NAB CT1 #1 30IN (SUTURE) ×3 IMPLANT
SUT MNCRL AB 4-0 PS2 18 (SUTURE) ×3 IMPLANT
SUT STRATAFIX 0 PDS 27 VIOLET (SUTURE) ×3
SUT VIC AB 2-0 CT1 27 (SUTURE) ×6
SUT VIC AB 2-0 CT1 TAPERPNT 27 (SUTURE) ×2 IMPLANT
SUTURE STRATFX 0 PDS 27 VIOLET (SUTURE) ×1 IMPLANT
SYR 50ML LL SCALE MARK (SYRINGE) IMPLANT
TRAY FOLEY MTR SLVR 16FR STAT (SET/KITS/TRAYS/PACK) ×3 IMPLANT
YANKAUER SUCT BULB TIP 10FT TU (MISCELLANEOUS) ×3 IMPLANT

## 2018-09-08 NOTE — Anesthesia Procedure Notes (Signed)
Spinal  Start time: 09/08/2018 12:13 PM End time: 09/08/2018 12:16 PM Staffing Anesthesiologist: Lillia Abed, MD Performed: anesthesiologist  Preanesthetic Checklist Completed: patient identified, surgical consent, pre-op evaluation, timeout performed, IV checked, risks and benefits discussed and monitors and equipment checked Spinal Block Patient position: sitting Prep: ChloraPrep Patient monitoring: heart rate, cardiac monitor, continuous pulse ox and blood pressure Approach: right paramedian Location: L4-5 Injection technique: single-shot Needle Needle type: Pencan  Needle gauge: 24 G Needle length: 9 cm Needle insertion depth: 7 cm

## 2018-09-08 NOTE — Discharge Instructions (Signed)
°Dr. Frank Aluisio °Total Joint Specialist °[facility] °[address] °[place], [place] [address] °[phone] ° °ANTERIOR APPROACH TOTAL HIP REPLACEMENT POSTOPERATIVE DIRECTIONS ° ° °Hip Rehabilitation, Guidelines Following Surgery  °The results of a hip operation are greatly improved after range of motion and muscle strengthening exercises. Follow all safety measures which are given to protect your hip. If any of these exercises cause increased pain or swelling in your joint, decrease the amount until you are comfortable again. Then slowly increase the exercises. Call your caregiver if you have problems or questions.  ° °HOME CARE INSTRUCTIONS  °• Remove items at home which could result in a fall. This includes throw rugs or furniture in walking pathways.  °· ICE to the affected hip every three hours for 30 minutes at a time and then as needed for pain and swelling.  Continue to use ice on the hip for pain and swelling from surgery. You may notice swelling that will progress down to the foot and ankle.  This is normal after surgery.  Elevate the leg when you are not up walking on it.   °· Continue to use the breathing machine which will help keep your temperature down.  It is common for your temperature to cycle up and down following surgery, especially at night when you are not up moving around and exerting yourself.  The breathing machine keeps your lungs expanded and your temperature down. ° °DIET °You may resume your previous home diet once your are discharged from the hospital. ° °DRESSING / WOUND CARE / SHOWERING °You may change your dressing 3-5 days after surgery.  Then change the dressing every day with sterile gauze.  Please use good hand washing techniques before changing the dressing.  Do not use any lotions or creams on the incision until instructed by your surgeon. °You may start showering once you are discharged home but do not submerge the incision under water. Just pat the  incision dry and apply a dry gauze dressing on daily. °Change the surgical dressing daily and reapply a dry dressing each time. ° °ACTIVITY °Walk with your walker as instructed. °Use walker as long as suggested by your caregivers. °Avoid periods of inactivity such as sitting longer than an hour when not asleep. This helps prevent blood clots.  °You may resume a sexual relationship in one month or when given the OK by your doctor.  °You may return to work once you are cleared by your doctor.  °Do not drive a car for 6 weeks or until released by you surgeon.  °Do not drive while taking narcotics. ° °WEIGHT BEARING °Weight bearing as tolerated with assist device (walker, cane, etc) as directed, use it as long as suggested by your surgeon or therapist, typically at least 4-6 weeks. ° °POSTOPERATIVE CONSTIPATION PROTOCOL °Constipation - defined medically as fewer than three stools per week and severe constipation as less than one stool per week. ° °One of the most common issues patients have following surgery is constipation.  Even if you have a regular bowel pattern at home, your normal regimen is likely to be disrupted due to multiple reasons following surgery.  Combination of anesthesia, postoperative narcotics, change in appetite and fluid intake all can affect your bowels.  In order to avoid complications following surgery, here are some recommendations in order to help you during your recovery period. ° °Colace (docusate) - Pick up an over-the-counter form of Colace or another stool softener and take twice a day   as long as you are requiring postoperative pain medications.  Take with a full glass of water daily.  If you experience loose stools or diarrhea, hold the colace until you stool forms back up.  If your symptoms do not get better within 1 week or if they get worse, check with your doctor. ° °Dulcolax (bisacodyl) - Pick up over-the-counter and take as directed by the product packaging as needed to assist with  the movement of your bowels.  Take with a full glass of water.  Use this product as needed if not relieved by Colace only.  ° °MiraLax (polyethylene glycol) - Pick up over-the-counter to have on hand.  MiraLax is a solution that will increase the amount of water in your bowels to assist with bowel movements.  Take as directed and can mix with a glass of water, juice, soda, coffee, or tea.  Take if you go more than two days without a movement. °Do not use MiraLax more than once per day. Call your doctor if you are still constipated or irregular after using this medication for 7 days in a row. ° °If you continue to have problems with postoperative constipation, please contact the office for further assistance and recommendations.  If you experience "the worst abdominal pain ever" or develop nausea or vomiting, please contact the office immediatly for further recommendations for treatment. ° °ITCHING ° If you experience itching with your medications, try taking only a single pain pill, or even half a pain pill at a time.  You can also use Benadryl over the counter for itching or also to help with sleep.  ° °TED HOSE STOCKINGS °Wear the elastic stockings on both legs for three weeks following surgery during the day but you may remove then at night for sleeping. ° °MEDICATIONS °See your medication summary on the “After Visit Summary” that the nursing staff will review with you prior to discharge.  You may have some home medications which will be placed on hold until you complete the course of blood thinner medication.  It is important for you to complete the blood thinner medication as prescribed by your surgeon.  Continue your approved medications as instructed at time of discharge. ° °PRECAUTIONS °If you experience chest pain or shortness of breath - call 911 immediately for transfer to the hospital emergency department.  °If you develop a fever greater that 101 F, purulent drainage from wound, increased redness or  drainage from wound, foul odor from the wound/dressing, or calf pain - CONTACT YOUR SURGEON.   °                                                °FOLLOW-UP APPOINTMENTS °Make sure you keep all of your appointments after your operation with your surgeon and caregivers. You should call the office at the above phone number and make an appointment for approximately two weeks after the date of your surgery or on the date instructed by your surgeon outlined in the "After Visit Summary". ° °RANGE OF MOTION AND STRENGTHENING EXERCISES  °These exercises are designed to help you keep full movement of your hip joint. Follow your caregiver's or physical therapist's instructions. Perform all exercises about fifteen times, three times per day or as directed. Exercise both hips, even if you have had only one joint replacement. These exercises can be done on   a training (exercise) mat, on the floor, on a table or on a bed. Use whatever works the best and is most comfortable for you. Use music or television while you are exercising so that the exercises are a pleasant break in your day. This will make your life better with the exercises acting as a break in routine you can look forward to.   Lying on your back, slowly slide your foot toward your buttocks, raising your knee up off the floor. Then slowly slide your foot back down until your leg is straight again.   Lying on your back spread your legs as far apart as you can without causing discomfort.   Lying on your side, raise your upper leg and foot straight up from the floor as far as is comfortable. Slowly lower the leg and repeat.   Lying on your back, tighten up the muscle in the front of your thigh (quadriceps muscles). You can do this by keeping your leg straight and trying to raise your heel off the floor. This helps strengthen the largest muscle supporting your knee.   Lying on your back, tighten up the muscles of your buttocks both with the legs straight and with  the knee bent at a comfortable angle while keeping your heel on the floor.   IF YOU ARE TRANSFERRED TO A SKILLED REHAB FACILITY If the patient is transferred to a skilled rehab facility following release from the hospital, a list of the current medications will be sent to the facility for the patient to continue.  When discharged from the skilled rehab facility, please have the facility set up the patient's [place] prior to being released. Also, the skilled facility will be responsible for providing the patient with their medications at time of release from the facility to include their pain medication, the muscle relaxants, and their blood thinner medication. If the patient is still at the rehab facility at time of the two week follow up appointment, the skilled rehab facility will also need to assist the patient in arranging follow up appointment in our office and any transportation needs.  MAKE SURE YOU:   Understand these instructions.   Get help right away if you are not doing well or get worse.    Pick up stool softner and laxative for home use following surgery while on pain medications. Do not submerge incision under water. Please use good hand washing techniques while changing dressing each day. May shower starting three days after surgery. Please use a clean towel to pat the incision dry following showers. Continue to use ice for pain and swelling after surgery. Do not use any lotions or creams on the incision until instructed by your surgeon.

## 2018-09-08 NOTE — Transfer of Care (Signed)
Immediate Anesthesia Transfer of Care Note  Patient: Corey Ward  Procedure(s) Performed: TOTAL HIP ARTHROPLASTY ANTERIOR APPROACH (Right )  Patient Location: PACU  Anesthesia Type:Spinal  Level of Consciousness: awake, alert  and oriented  Airway & Oxygen Therapy: Patient Spontanous Breathing and Patient connected to face mask  Post-op Assessment: Report given to RN and Post -op Vital signs reviewed and stable  Post vital signs: Reviewed and stable  Last Vitals:  Vitals Value Taken Time  BP    Temp    Pulse 65 09/08/18 1417  Resp 11 09/08/18 1417  SpO2 100 % 09/08/18 1417  Vitals shown include unvalidated device data.  Last Pain:  Vitals:   09/08/18 1100  TempSrc:   PainSc: 0-No pain         Complications: No apparent anesthesia complications

## 2018-09-08 NOTE — Op Note (Signed)
OPERATIVE REPORT- TOTAL HIP ARTHROPLASTY   PREOPERATIVE DIAGNOSIS: Osteoarthritis of the Right hip.   POSTOPERATIVE DIAGNOSIS: Osteoarthritis of the Right  hip.   PROCEDURE: Right total hip arthroplasty, anterior approach.   SURGEON: Gaynelle Arabian, MD   ASSISTANT: Theresa Duty, PA-C  ANESTHESIA:  Spinal  ESTIMATED BLOOD LOSS:-600 mL    DRAINS: Hemovac x1.   COMPLICATIONS: None   CONDITION: PACU-stable condition  BRIEF CLINICAL NOTE: Corey Ward is a 68 y.o. male who has advanced end-  stage arthritis of their Right  hip with progressively worsening pain and  dysfunction.The patient has failed nonoperative management and presents for  total hip arthroplasty.   PROCEDURE IN DETAIL: After successful administration of spinal  anesthetic, the traction boots for the [facility] bed were placed on both  feet and the patient was placed onto the [facility] bed, boots placed into the leg  holders. The Right hip was then isolated from the perineum with plastic  drapes and prepped and draped in the usual sterile fashion. ASIS and  greater trochanter were marked and a oblique incision was made, starting  at about 1 cm lateral and 2 cm distal to the ASIS and coursing towards  the anterior cortex of the femur. The skin was cut with a 10 blade  through subcutaneous tissue to the level of the fascia overlying the  tensor fascia lata muscle. The fascia was then incised in line with the  incision at the junction of the anterior third and posterior 2/3rd. The  muscle was teased off the fascia and then the interval between the TFL  and the rectus was developed. The Hohmann retractor was then placed at  the top of the femoral neck over the capsule. The vessels overlying the  capsule were cauterized and the fat on top of the capsule was removed.  A Hohmann retractor was then placed anterior underneath the rectus  femoris to give exposure to the entire anterior capsule. A T-shaped  capsulotomy  was performed. The edges were tagged and the femoral head  was identified.       Osteophytes are removed off the superior acetabulum.  The femoral neck was then cut in situ with an oscillating saw. Traction  was then applied to the left lower extremity utilizing the [facility]  traction. The femoral head was then removed. Retractors were placed  around the acetabulum and then circumferential removal of the labrum was  performed. Osteophytes were also removed. Reaming starts at 49 mm to  medialize and  Increased in 2 mm increments to 53 mm. We reamed in  approximately 40 degrees of abduction, 20 degrees anteversion. A 54 mm  pinnacle acetabular shell was then impacted in anatomic position under  fluoroscopic guidance with excellent purchase. We did not need to place  any additional dome screws. A 36 mm neutral + 4 marathon liner was then  placed into the acetabular shell.       The femoral lift was then placed along the lateral aspect of the femur  just distal to the vastus ridge. The leg was  externally rotated and capsule  was stripped off the inferior aspect of the femoral neck down to the  level of the lesser trochanter, this was done with electrocautery. The femur was lifted after this was performed. The  leg was then placed in an extended and adducted position essentially delivering the femur. We also removed the capsule superiorly and the piriformis from the piriformis fossa to gain  excellent exposure of the  proximal femur. Rongeur was used to remove some cancellous bone to get  into the lateral portion of the proximal femur for placement of the  initial starter reamer. The starter broaches was placed  the starter broach  and was shown to go down the center of the canal. Broaching  with the Actis system was then performed starting at size 0  coursing  Up to size 6. A size 6 had excellent torsional and rotational  and axial stability. The trial standard offset neck was then placed  with a 36  + 1.5 trial head. The hip was then reduced. We confirmed that  the stem was in the canal both on AP and lateral x-rays. It also has excellent sizing. The hip was reduced with outstanding stability through full extension and full external rotation.. AP pelvis was taken and the leg lengths were measured and found to be equal. Hip was then dislocated again and the femoral head and neck removed. The  femoral broach was removed. Size 6 Actis stem with a standard offset  neck was then impacted into the femur following native anteversion. Has  excellent purchase in the canal. Excellent torsional and rotational and  axial stability. It is confirmed to be in the canal on AP and lateral  fluoroscopic views. The 36 + 1.5 ceramic head was placed and the hip  reduced with outstanding stability. Again AP pelvis was taken and it  confirmed that the leg lengths were equal. The wound was then copiously  irrigated with saline solution and the capsule reattached and repaired  with Ethibond suture. 30 ml of .25% Bupivicaine was  injected into the capsule and into the edge of the tensor fascia lata as well as subcutaneous tissue. The fascia overlying the tensor fascia lata was then closed with a running #1 V-Loc. Subcu was closed with interrupted 2-0 Vicryl and subcuticular running 4-0 Monocryl. Incision was cleaned  and dried. Steri-Strips and a bulky sterile dressing applied. Hemovac  drain was hooked to suction and then the patient was awakened and transported to  recovery in stable condition.        Please note that a surgical assistant was a medical necessity for this procedure to perform it in a safe and expeditious manner. Assistant was necessary to provide appropriate retraction of vital neurovascular structures and to prevent femoral fracture and allow for anatomic placement of the prosthesis.  Gaynelle Arabian, M.D.

## 2018-09-08 NOTE — Anesthesia Postprocedure Evaluation (Signed)
Anesthesia Post Note  Patient: Corey Ward  Procedure(s) Performed: TOTAL HIP ARTHROPLASTY ANTERIOR APPROACH (Right )     Patient location during evaluation: PACU Anesthesia Type: General Level of consciousness: oriented and awake and alert Pain management: pain level controlled Vital Signs Assessment: post-procedure vital signs reviewed and stable Respiratory status: spontaneous breathing, respiratory function stable and patient connected to nasal cannula oxygen Cardiovascular status: blood pressure returned to baseline and stable Postop Assessment: no headache, no backache and no apparent nausea or vomiting Anesthetic complications: no    Last Vitals:  Vitals:   09/08/18 1500 09/08/18 1600  BP: 111/64 140/82  Pulse: (!) 52 (!) 50  Resp: 17   Temp: 36.7 C 36.7 C  SpO2: 97% 98%    Last Pain:  Vitals:   09/08/18 1445  TempSrc:   PainSc: 0-No pain                 Bryli Mantey DAVID

## 2018-09-08 NOTE — Anesthesia Procedure Notes (Signed)
Procedure Name: MAC Date/Time: 09/08/2018 12:12 PM Performed by: Niel Hummer, CRNA Pre-anesthesia Checklist: Patient identified, Emergency Drugs available, Suction available and Patient being monitored Patient Re-evaluated:Patient Re-evaluated prior to induction Oxygen Delivery Method: Simple face mask

## 2018-09-08 NOTE — Interval H&P Note (Signed)
History and Physical Interval Note:  09/08/2018 10:59 AM  Corey Ward  has presented today for surgery, with the diagnosis of right hip osteoarthritis.  The various methods of treatment have been discussed with the patient and family. After consideration of risks, benefits and other options for treatment, the patient has consented to  Procedure(s) with comments: Nichols (Right) - 158min as a surgical intervention.  The patient's history has been reviewed, patient examined, no change in status, stable for surgery.  I have reviewed the patient's chart and labs.  Questions were answered to the patient's satisfaction.     Pilar Plate Marguerite Barba

## 2018-09-08 NOTE — Anesthesia Preprocedure Evaluation (Signed)
Anesthesia Evaluation  Patient identified by MRN, date of birth, ID band Patient awake    Reviewed: Allergy & Precautions, NPO status , Patient's Chart, lab work & pertinent test results  History of Anesthesia Complications (+) PONV  Airway Mallampati: I  TM Distance: >3 FB Neck ROM: Full    Dental   Pulmonary sleep apnea ,    Pulmonary exam normal        Cardiovascular Normal cardiovascular exam     Neuro/Psych    GI/Hepatic   Endo/Other    Renal/GU      Musculoskeletal   Abdominal   Peds  Hematology   Anesthesia Other Findings   Reproductive/Obstetrics                             Anesthesia Physical Anesthesia Plan  ASA: II  Anesthesia Plan: General   Post-op Pain Management:    Induction: Intravenous  PONV Risk Score and Plan: 3 and Ondansetron, Midazolam and Treatment may vary due to age or medical condition  Airway Management Planned: Simple Face Mask  Additional Equipment:   Intra-op Plan:   Post-operative Plan: Extubation in OR  Informed Consent: I have reviewed the patients History and Physical, chart, labs and discussed the procedure including the risks, benefits and alternatives for the proposed anesthesia with the patient or authorized representative who has indicated his/her understanding and acceptance.       Plan Discussed with: CRNA and Surgeon  Anesthesia Plan Comments:         Anesthesia Quick Evaluation

## 2018-09-08 NOTE — Evaluation (Signed)
Physical Therapy Evaluation Patient Details Name: Corey Ward MRN: [id number] DOB: 08-30-1950 Today's Date: 09/08/2018   History of Present Illness  68 yo male s/p R DA-THA on 09/08/18. PMH includes OA, OSA, bilat TKR.  Clinical Impression  Pt presents with R hip pain, increased time and effort to perform mobility tasks, and decreased activity tolerance. Pt to benefit from acute PT to address deficits. Pt ambulated hallway distance with RW with min guard assist, verbal cuing for form and safety provided throughout. Pt educated on ankle pumps (20/hour) to perform this afternoon/evening to increase circulation, to pt's tolerance and limited by pain. PT to progress mobility as tolerated, and will continue to follow acutely.        Follow Up Recommendations Follow surgeon's recommendation for DC plan and follow-up therapies;Supervision for mobility/OOB(HEP)    Equipment Recommendations  None recommended by PT    Recommendations for Other Services       Precautions / Restrictions Precautions Precautions: Fall Restrictions Weight Bearing Restrictions: No Other Position/Activity Restrictions: WBAT      Mobility  Bed Mobility Overal bed mobility: Needs Assistance Bed Mobility: Supine to Sit;Sit to Supine     Supine to sit: Min assist;HOB elevated Sit to supine: Min assist;HOB elevated   General bed mobility comments: Min assist for RLE management, increased time and effort to come to and from sitting.  Transfers Overall transfer level: Needs assistance Equipment used: Rolling walker (2 wheeled) Transfers: Sit to/from Stand Sit to Stand: Min guard;From elevated surface         General transfer comment: Min guard for safety, verbal cuing for hand placement when rising.  Ambulation/Gait Ambulation/Gait assistance: Min guard Gait Distance (Feet): 100 Feet Assistive device: Rolling walker (2 wheeled) Gait Pattern/deviations: Step-to pattern;Step-through pattern;Decreased  stride length;Trunk flexed Gait velocity: decr   General Gait Details: Min guard for safety, verbal cuing for upright posture, sequencing, placement in RW.  Stairs            Wheelchair Mobility    Modified Rankin (Stroke Patients Only)       Balance Overall balance assessment: Mild deficits observed, not formally tested                                           Pertinent Vitals/Pain Pain Assessment: 0-10 Pain Score: 4  Pain Location: R hip Pain Descriptors / Indicators: Aching Pain Intervention(s): Monitored during session;Limited activity within patient's tolerance;Repositioned;Premedicated before session    Home Living Family/patient expects to be discharged to:: Private residence Living Arrangements: Spouse/significant other Available Help at Discharge: Family Type of Home: House Home Access: Stairs to enter Entrance Stairs-Rails: None Entrance Stairs-Number of Steps: 3 Home Layout: Two level;Bed/bath upstairs Home Equipment: Walker - 2 wheels;Cane - single point      Prior Function Level of Independence: Independent               Hand Dominance   Dominant Hand: Right    Extremity/Trunk Assessment   Upper Extremity Assessment Upper Extremity Assessment: Overall WFL for tasks assessed    Lower Extremity Assessment Lower Extremity Assessment: Overall WFL for tasks assessed    Cervical / Trunk Assessment Cervical / Trunk Assessment: Normal  Communication   Communication: No difficulties  Cognition Arousal/Alertness: Awake/alert Behavior During Therapy: WFL for tasks assessed/performed Overall Cognitive Status: Within Functional Limits for tasks assessed  General Comments      Exercises     Assessment/Plan    PT Assessment Patient needs continued PT services  PT Problem List Decreased range of motion;Decreased activity tolerance;Decreased balance;Decreased  mobility;Decreased knowledge of use of DME;Pain       PT Treatment Interventions DME instruction;Therapeutic activities;Gait training;Therapeutic exercise;Patient/family education;Balance training;Stair training;Functional mobility training    PT Goals (Current goals can be found in the Care Plan section)  Acute Rehab PT Goals Patient Stated Goal: go home to wife PT Goal Formulation: With patient Time For Goal Achievement: 09/15/18 Potential to Achieve Goals: Good    Frequency 7X/week   Barriers to discharge        Co-evaluation               AM-PAC PT "6 Clicks" Mobility  Outcome Measure Help needed turning from your back to your side while in a flat bed without using bedrails?: A Little Help needed moving from lying on your back to sitting on the side of a flat bed without using bedrails?: A Little Help needed moving to and from a bed to a chair (including a wheelchair)?: A Little Help needed standing up from a chair using your arms (e.g., wheelchair or bedside chair)?: A Little Help needed to walk in hospital room?: A Little Help needed climbing 3-5 steps with a railing? : A Lot 6 Click Score: 17    End of Session Equipment Utilized During Treatment: Gait belt Activity Tolerance: Patient tolerated treatment well Patient left: in bed;with bed alarm set;with SCD's reapplied;with call bell/phone within reach Nurse Communication: Mobility status PT Visit Diagnosis: Difficulty in walking, not elsewhere classified (R26.2);Other abnormalities of gait and mobility (R26.89)    Time: 4259-5638 PT Time Calculation (min) (ACUTE ONLY): 20 min   Charges:   PT Evaluation $PT Eval Low Complexity: 1 Low         Breanna Mcdaniel Conception Chancy, PT Acute Rehabilitation Services Pager [phone]  Office [phone]   Darril Patriarca D Chelesea Weiand 09/08/2018, 7:30 PM

## 2018-09-09 ENCOUNTER — Encounter (HOSPITAL_COMMUNITY): Payer: Self-pay | Admitting: Orthopedic Surgery

## 2018-09-09 LAB — BASIC METABOLIC PANEL
Anion gap: 11 (ref 5–15)
BUN: 14 mg/dL (ref 8–23)
CO2: 23 mmol/L (ref 22–32)
Calcium: 8.5 mg/dL — ABNORMAL LOW (ref 8.9–10.3)
Chloride: 103 mmol/L (ref 98–111)
Creatinine, Ser: 0.76 mg/dL (ref 0.61–1.24)
GFR calc Af Amer: >60 mL/min (ref >60)
GFR calc non Af Amer: >60 mL/min (ref >60)
Glucose, Bld: 227 mg/dL — ABNORMAL HIGH (ref 70–99)
Potassium: 4.2 mmol/L (ref 3.5–5.1)
Sodium: 137 mmol/L (ref 135–145)

## 2018-09-09 LAB — CBC
HCT: 37.1 % — ABNORMAL LOW (ref 39.0–52.0)
Hemoglobin: 12.2 g/dL — ABNORMAL LOW (ref 13.0–17.0)
MCH: 31.4 pg (ref 26.0–34.0)
MCHC: 32.9 g/dL (ref 30.0–36.0)
MCV: 95.4 fL (ref 80.0–100.0)
Platelets: 165 10*3/uL (ref 150–400)
RBC: 3.89 MIL/uL — ABNORMAL LOW (ref 4.22–5.81)
RDW: 12.7 % (ref 11.5–15.5)
WBC: 12.6 10*3/uL — ABNORMAL HIGH (ref 4.0–10.5)
nRBC: 0 % (ref 0.0–0.2)

## 2018-09-09 MED ORDER — TRAMADOL HCL 50 MG PO TABS: 50.0000 mg | ORAL_TABLET | Freq: Four times a day (QID) | ORAL | 0 refills | Status: DC | PRN

## 2018-09-09 MED ORDER — HYDROCODONE-ACETAMINOPHEN 5-325 MG PO TABS: 1.0000 | ORAL_TABLET | Freq: Four times a day (QID) | ORAL | 0 refills | Status: DC | PRN

## 2018-09-09 MED ORDER — ASPIRIN 325 MG PO TBEC: 325.0000 mg | DELAYED_RELEASE_TABLET | Freq: Two times a day (BID) | ORAL | 0 refills | Status: AC

## 2018-09-09 MED ORDER — METHOCARBAMOL 500 MG PO TABS: 500.0000 mg | ORAL_TABLET | Freq: Four times a day (QID) | ORAL | 0 refills | Status: DC | PRN

## 2018-09-09 NOTE — Progress Notes (Signed)
Physical Therapy Treatment Patient Details Name: Corey Ward MRN: [id number] DOB: 05-Apr-1950 Today's Date: 09/09/2018    History of Present Illness 68 yo male s/p R DA-THA on 09/08/18. PMH includes OA, OSA, bilat TKR.    PT Comments    POD # 1 pm session Assisted with amb in hallway a second time.  Practiced stairs.  Then returned to room to perform some TE's following HEP handout.  Instructed on proper tech, freq as well as use of ICE.   Addressed all mobility questions, discussed appropriate activity, educated on use of ICE.  Pt ready for D/C to home.   Follow Up Recommendations  Follow surgeon's recommendation for DC plan and follow-up therapies;Supervision for mobility/OOB;Other (comment)(HEP)     Equipment Recommendations  None recommended by PT    Recommendations for Other Services       Precautions / Restrictions Precautions Precautions: Fall Restrictions Weight Bearing Restrictions: No Other Position/Activity Restrictions: WBAT    Mobility  Bed Mobility Overal bed mobility: Needs Assistance Bed Mobility: Sit to Supine     Supine to sit: Min guard;Min assist     General bed mobility comments: demonstared and instructed how to use a belt to self assist LE onto bed  Transfers Overall transfer level: Needs assistance Equipment used: Rolling walker (2 wheeled) Transfers: Sit to/from Stand Sit to Stand: Min guard;From elevated surface;Supervision         General transfer comment: 25% VC's on proper hand placement esp with stand to sit  Ambulation/Gait Ambulation/Gait assistance: Min guard;Supervision Gait Distance (Feet): 75 Feet Assistive device: Rolling walker (2 wheeled) Gait Pattern/deviations: Step-to pattern;Step-through pattern;Decreased stride length;Trunk flexed Gait velocity: decr   General Gait Details: Min guard for safety, verbal cuing for upright posture, sequencing, placement in RW.   Stairs Stairs: Yes Stairs assistance: Min  guard Stair Management: No rails;Step to pattern;Forwards;With walker Number of Stairs: 2 General stair comments: 25% VC's on proper walker placement and proper sequencing   Wheelchair Mobility    Modified Rankin (Stroke Patients Only)       Balance                                            Cognition Arousal/Alertness: Awake/alert Behavior During Therapy: WFL for tasks assessed/performed;Flat affect Overall Cognitive Status: Within Functional Limits for tasks assessed                                        Exercises   Total Hip Replacement TE's 10 reps ankle pumps 10 reps knee presses 10 reps heel slides 10 reps SAQ's 10 reps ABD Followed by ICE     General Comments        Pertinent Vitals/Pain Pain Assessment: 0-10 Pain Score: 3  Pain Location: R hip Pain Descriptors / Indicators: Aching;Tender;Tingling Pain Intervention(s): Monitored during session;Premedicated before session;Repositioned;Ice applied    Home Living                      Prior Function            PT Goals (current goals can now be found in the care plan section) Progress towards PT goals: Progressing toward goals    Frequency    7X/week      PT Plan Current  plan remains appropriate    Co-evaluation              AM-PAC PT "6 Clicks" Mobility   Outcome Measure  Help needed turning from your back to your side while in a flat bed without using bedrails?: None Help needed moving from lying on your back to sitting on the side of a flat bed without using bedrails?: A Little Help needed moving to and from a bed to a chair (including a wheelchair)?: A Little Help needed standing up from a chair using your arms (e.g., wheelchair or bedside chair)?: A Little Help needed to walk in hospital room?: A Little Help needed climbing 3-5 steps with a railing? : A Lot 6 Click Score: 18    End of Session Equipment Utilized During Treatment:  Gait belt Activity Tolerance: Patient tolerated treatment well Patient left: in chair;with call bell/phone within reach Nurse Communication: Mobility status PT Visit Diagnosis: Difficulty in walking, not elsewhere classified (R26.2);Other abnormalities of gait and mobility (R26.89)     Time: 4128-2081 PT Time Calculation (min) (ACUTE ONLY): 29 min  Charges:  $Gait Training: 8-22 mins $Therapeutic Activity: 8-22 mins                     Rica Koyanagi  PTA Acute  Rehabilitation Services Pager      [phone] Office      [phone]

## 2018-09-09 NOTE — Progress Notes (Signed)
Physical Therapy Treatment Patient Details Name: Corey Ward MRN: [id number] DOB: June 24, 1950 Today's Date: 09/09/2018    History of Present Illness 68 yo male s/p R DA-THA on 09/08/18. PMH includes OA, OSA, bilat TKR.    PT Comments    POD # 1 am session Assisted OOB.  Demonstrated and instructed pt how to use belt to self assist LE as a leg lifter Assisted with amb.  General transfer comment: 25% VC's on proper hand placement esp with stand to sit.  General Gait Details: Min guard for safety, verbal cuing for upright posture, sequencing, placement in RW. Then returned to room to perform some TE's following HEP handout.  Instructed on proper tech, freq as well as use of ICE.   Pt will need another PT session to complete TE's and practice stairs.   Follow Up Recommendations  Follow surgeon's recommendation for DC plan and follow-up therapies;Supervision for mobility/OOB;Other (comment)(HEP)     Equipment Recommendations  None recommended by PT    Recommendations for Other Services       Precautions / Restrictions Precautions Precautions: Fall Restrictions Weight Bearing Restrictions: No Other Position/Activity Restrictions: WBAT    Mobility  Bed Mobility Overal bed mobility: Needs Assistance Bed Mobility: Supine to Sit     Supine to sit: Min guard;Min assist     General bed mobility comments: demonstared and instructed how to use a belt to self assist LE off bed  Transfers Overall transfer level: Needs assistance Equipment used: Rolling walker (2 wheeled) Transfers: Sit to/from Stand Sit to Stand: Min guard;From elevated surface;Supervision         General transfer comment: 25% VC's on proper hand placement esp with stand to sit  Ambulation/Gait Ambulation/Gait assistance: Min guard;Supervision Gait Distance (Feet): 75 Feet Assistive device: Rolling walker (2 wheeled) Gait Pattern/deviations: Step-to pattern;Step-through pattern;Decreased stride length;Trunk  flexed Gait velocity: decr   General Gait Details: Min guard for safety, verbal cuing for upright posture, sequencing, placement in RW.   Stairs             Wheelchair Mobility    Modified Rankin (Stroke Patients Only)       Balance                                            Cognition Arousal/Alertness: Awake/alert Behavior During Therapy: WFL for tasks assessed/performed;Flat affect Overall Cognitive Status: Within Functional Limits for tasks assessed                                        Exercises  10 reps all standing TE's following HEP handout.      General Comments        Pertinent Vitals/Pain Pain Assessment: 0-10 Pain Score: 3  Pain Location: R hip Pain Descriptors / Indicators: Aching;Tender;Tingling Pain Intervention(s): Monitored during session;Premedicated before session;Repositioned;Ice applied    Home Living                      Prior Function            PT Goals (current goals can now be found in the care plan section) Progress towards PT goals: Progressing toward goals    Frequency    7X/week      PT Plan Current plan  remains appropriate    Co-evaluation              AM-PAC PT "6 Clicks" Mobility   Outcome Measure  Help needed turning from your back to your side while in a flat bed without using bedrails?: None Help needed moving from lying on your back to sitting on the side of a flat bed without using bedrails?: A Little Help needed moving to and from a bed to a chair (including a wheelchair)?: A Little Help needed standing up from a chair using your arms (e.g., wheelchair or bedside chair)?: A Little Help needed to walk in hospital room?: A Little Help needed climbing 3-5 steps with a railing? : A Lot 6 Click Score: 18    End of Session Equipment Utilized During Treatment: Gait belt Activity Tolerance: Patient tolerated treatment well Patient left: in chair;with call  bell/phone within reach Nurse Communication: Mobility status PT Visit Diagnosis: Difficulty in walking, not elsewhere classified (R26.2);Other abnormalities of gait and mobility (R26.89)     Time: 3790-2409 PT Time Calculation (min) (ACUTE ONLY): 25 min  Charges:  $Gait Training: 8-22 mins $Therapeutic Exercise: 8-22 mins                     Rica Koyanagi  PTA Acute  Rehabilitation Services Pager      [phone] Office      [phone]

## 2018-09-09 NOTE — Progress Notes (Signed)
   Subjective: 1 Day Post-Op Procedure(s) (LRB): TOTAL HIP ARTHROPLASTY ANTERIOR APPROACH (Right) Patient reports pain as mild.   Patient seen in rounds by Dr. Wynelle Link. Patient is well, and has had no acute complaints or problems. States he is ready to go home. Denies chest pain or SOB. No issues overnight, foley catheter removed this AM.  We will continue therapy today.   Objective: Vital signs in last 24 hours: Temp:  [97.6 F (36.4 C)-99.1 F (37.3 C)] 97.6 F (36.4 C) (06/25 0538) Pulse Rate:  [50-68] 55 (06/25 0538) Resp:  [11-18] 18 (06/25 0538) BP: (103-159)/(62-92) 128/65 (06/25 0538) SpO2:  [96 %-100 %] 97 % (06/25 0538) Weight:  [123.8 kg] 123.8 kg (06/24 1000)  Intake/Output from previous day:  Intake/Output Summary (Last 24 hours) at 09/09/2018 0738 Last data filed at 09/09/2018 7253 Gross per 24 hour  Intake 3532.28 ml  Output 3510 ml  Net 22.28 ml    Labs: Recent Labs    09/09/18 0243  HGB 12.2*   Recent Labs    09/09/18 0243  WBC 12.6*  RBC 3.89*  HCT 37.1*  PLT 165   Recent Labs    09/09/18 0243  NA 137  K 4.2  CL 103  CO2 23  BUN 14  CREATININE 0.76  GLUCOSE 227*  CALCIUM 8.5*   Exam: General - Patient is Alert and Oriented Extremity - Neurologically intact Neurovascular intact Sensation intact distally Dorsiflexion/Plantar flexion intact Dressing - dressing C/D/I Motor Function - intact, moving foot and toes well on exam.   Past Medical History:  Diagnosis Date  . Arthritis   . Asthma    When younger no problem now  . BPH (benign prostatic hyperplasia)   . Gout   . Hyperlipidemia   . OSA (obstructive sleep apnea)   . PONV (postoperative nausea and vomiting)   . Pre-diabetes   . Sleep apnea   . Urinary retention     Assessment/Plan: 1 Day Post-Op Procedure(s) (LRB): TOTAL HIP ARTHROPLASTY ANTERIOR APPROACH (Right) Principal Problem:   OA (osteoarthritis) of hip Active Problems:   Osteoarthritis of right hip   Estimated body mass index is 33.22 kg/m as calculated from the following:   Height as of this encounter: 6\' 4"  (1.93 m).   Weight as of this encounter: 123.8 kg. Advance diet Up with therapy D/C IV fluids  DVT Prophylaxis - Aspirin Weight bearing as tolerated. D/C O2 and pulse ox and try on room air. Hemovac pulled without difficulty, will continue therapy.  Plan is to go Home after hospital stay. Plan for discharge with HEP once meeting goals with therapy. Follow-up in the office in 2 weeks.   Theresa Duty, PA-C Orthopedic Surgery 09/09/2018, 7:38 AM

## 2018-09-13 NOTE — Discharge Summary (Signed)
Physician Discharge Summary   Patient ID: Corey [place] MRN: [id number] DOB/AGE: [birth year] 68 y.o.  Admit date: 09/08/2018 Discharge date: 09/09/2018  Primary Diagnosis: Osteoarthritis, right hip   Admission Diagnoses:  Past Medical History:  Diagnosis Date   Arthritis    Asthma    When younger no problem now   BPH (benign prostatic hyperplasia)    Gout    Hyperlipidemia    OSA (obstructive sleep apnea)    PONV (postoperative nausea and vomiting)    Pre-diabetes    Sleep apnea    Urinary retention    Discharge Diagnoses:   Principal Problem:   OA (osteoarthritis) of hip Active Problems:   Osteoarthritis of right hip  Estimated body mass index is 33.22 kg/m as calculated from the following:   Height as of this encounter: 6\' 4"  (1.93 m).   Weight as of this encounter: 123.8 kg.  Procedure:  Procedure(s) (LRB): TOTAL HIP ARTHROPLASTY ANTERIOR APPROACH (Right)   Consults: None  HPI: Corey [place] is a 68 y.o. male who has advanced end-stage arthritis of their Right  hip with progressively worsening pain and dysfunction.The patient has failed nonoperative management and presents for total hip arthroplasty.   Laboratory Data: Admission on 09/08/2018, Discharged on 09/09/2018  Component Date Value Ref Range Status   WBC 09/09/2018 12.6* 4.0 - 10.5 K/uL Final   RBC 09/09/2018 3.89* 4.22 - 5.81 MIL/uL Final   Hemoglobin 09/09/2018 12.2* 13.0 - 17.0 g/dL Final   HCT 09/09/2018 37.1* 39.0 - 52.0 % Final   MCV 09/09/2018 95.4  80.0 - 100.0 fL Final   MCH 09/09/2018 31.4  26.0 - 34.0 pg Final   MCHC 09/09/2018 32.9  30.0 - 36.0 g/dL Final   RDW 09/09/2018 12.7  11.5 - 15.5 % Final   Platelets 09/09/2018 165  150 - 400 K/uL Final   nRBC 09/09/2018 0.0  0.0 - 0.2 % Final   Performed at [facility], [place] [address], [place], [place] [address]   Sodium 09/09/2018 137  135 - 145 mmol/L Final   Potassium 09/09/2018 4.2  3.5 - 5.1 mmol/L  Final   Chloride 09/09/2018 103  98 - 111 mmol/L Final   CO2 09/09/2018 23  22 - 32 mmol/L Final   Glucose, Bld 09/09/2018 227* 70 - 99 mg/dL Final   BUN 09/09/2018 14  8 - 23 mg/dL Final   Creatinine, Ser 09/09/2018 0.76  0.61 - 1.24 mg/dL Final   Calcium 09/09/2018 8.5* 8.9 - 10.3 mg/dL Final   GFR calc non Af Amer 09/09/2018 >60  >60 mL/min Final   GFR calc Af Amer 09/09/2018 >60  >60 mL/min Final   Anion gap 09/09/2018 11  5 - 15 Final   Performed at [facility], [place] [address] [place], [place] [address]  Hospital Outpatient Visit on 09/04/2018  Component Date Value Ref Range Status   SARS Coronavirus 2 09/04/2018 NEGATIVE  NEGATIVE Final   Comment: (NOTE) SARS-CoV-2 target nucleic acids are NOT DETECTED. The SARS-CoV-2 RNA is generally detectable in upper and lower respiratory specimens during the acute phase of infection. Negative results do not preclude SARS-CoV-2 infection, do not rule out co-infections with other pathogens, and should not be used as the sole basis for treatment or other patient management decisions. Negative results must be combined with clinical observations, patient history, and epidemiological information. The expected result is Negative. Fact Sheet for Patients: TrashEliminator.se Fact Sheet for Healthcare Providers: WhoisBlogging.ch This test is not  yet approved or cleared by the [place] and  has been authorized for detection and/or diagnosis of SARS-CoV-2 by FDA under an Emergency Use Authorization (EUA). This EUA will remain  in effect (meaning this test can be used) for the duration of the COVID-19 declaration under Section 56                          4(b)(1) of the Act, 21 U.S.C. section 360bbb-3(b)(1), unless the authorization is terminated or revoked sooner. Performed at [facility], [place] [address] [place], [place] [address]   Hospital Outpatient  Visit on 09/02/2018  Component Date Value Ref Range Status   aPTT 09/02/2018 31  24 - 36 seconds Final   Performed at [facility], [place] [address], [place], [place] [address]   WBC 09/02/2018 5.9  4.0 - 10.5 K/uL Final   RBC 09/02/2018 4.40  4.22 - 5.81 MIL/uL Final   Hemoglobin 09/02/2018 13.4  13.0 - 17.0 g/dL Final   HCT 09/02/2018 42.1  39.0 - 52.0 % Final   MCV 09/02/2018 95.7  80.0 - 100.0 fL Final   MCH 09/02/2018 30.5  26.0 - 34.0 pg Final   MCHC 09/02/2018 31.8  30.0 - 36.0 g/dL Final   RDW 09/02/2018 12.7  11.5 - 15.5 % Final   Platelets 09/02/2018 174  150 - 400 K/uL Final   nRBC 09/02/2018 0.0  0.0 - 0.2 % Final   Performed at [facility], [place] [address], [place], [place] [address]   Sodium 09/02/2018 139  135 - 145 mmol/L Final   Potassium 09/02/2018 4.4  3.5 - 5.1 mmol/L Final   Chloride 09/02/2018 107  98 - 111 mmol/L Final   CO2 09/02/2018 24  22 - 32 mmol/L Final   Glucose, Bld 09/02/2018 127* 70 - 99 mg/dL Final   BUN 09/02/2018 20  8 - 23 mg/dL Final   Creatinine, Ser 09/02/2018 0.73  0.61 - 1.24 mg/dL Final   Calcium 09/02/2018 9.2  8.9 - 10.3 mg/dL Final   Total Protein 09/02/2018 7.5  6.5 - 8.1 g/dL Final   Albumin 09/02/2018 4.5  3.5 - 5.0 g/dL Final   AST 09/02/2018 19  15 - 41 U/L Final   ALT 09/02/2018 14  0 - 44 U/L Final   Alkaline Phosphatase 09/02/2018 63  38 - 126 U/L Final   Total Bilirubin 09/02/2018 0.7  0.3 - 1.2 mg/dL Final   GFR calc non Af Amer 09/02/2018 >60  >60 mL/min Final   GFR calc Af Amer 09/02/2018 >60  >60 mL/min Final   Anion gap 09/02/2018 8  5 - 15 Final   Performed at [facility], [place] [address] [place], [place] [address]   Prothrombin Time 09/02/2018 12.5  11.4 - 15.2 seconds Final   INR 09/02/2018 1.0  0.8 - 1.2 Final   Comment: (NOTE) INR goal varies based on device and disease states. Performed at [facility], [place]  [address] [place], [place] [address]    ABO/RH(D) 09/02/2018 O POS   Final   Antibody Screen 09/02/2018 NEG   Final   Sample Expiration 09/02/2018 09/11/2018,2359   Final   Extend sample reason 09/02/2018    Final                   Value:NO TRANSFUSIONS OR PREGNANCY IN THE PAST 3 MONTHS Performed at [facility],  [place] [address], [place], [place] [address]    MRSA, PCR 09/02/2018 NEGATIVE  NEGATIVE Final   Staphylococcus aureus 09/02/2018 NEGATIVE  NEGATIVE Final   Comment: (NOTE) The Xpert SA Assay (FDA approved for NASAL specimens in patients 42 years of age and older), is one component of a comprehensive surveillance program. It is not intended to diagnose infection nor to guide or monitor treatment. Performed at [facility], [place] [address], [place], [place] [address]    Hgb A1c MFr Bld 09/02/2018 6.4* 4.8 - 5.6 % Final   Comment: (NOTE) Pre diabetes:          5.7%-6.4% Diabetes:              >6.4% Glycemic control for   <7.0% adults with diabetes    Mean Plasma Glucose 09/02/2018 136.98  mg/dL Final   Performed at [facility], [place] [address], [place], [place] [address]     X-Rays:Dg Pelvis Portable  Result Date: 09/08/2018 CLINICAL DATA:  Postop right total hip arthroplasty. EXAM: PORTABLE PELVIS 1-2 VIEWS COMPARISON:  Intraoperative views earlier the same date. FINDINGS: 1436 hours. Status post right total hip arthroplasty. The hardware is well positioned. No evidence of acute fracture or dislocation. There is a small amount of gas lateral to the right hip. A surgical drain is present. Pelvic calcifications are likely phleboliths. IMPRESSION: No demonstrated complication following right total hip arthroplasty. Electronically Signed   By: Richardean Sale M.D.   On: 09/08/2018 16:53   Dg C-arm 1-60 Min-no Report  Result Date: 09/08/2018 Fluoroscopy was utilized by the requesting physician.  No radiographic interpretation.    Dg Hip Operative Unilat W Or W/o Pelvis Right  Result Date: 09/08/2018 CLINICAL DATA:  Arthritis hip replacement EXAM: OPERATIVE right HIP (WITH PELVIS IF PERFORMED) 4 VIEWS TECHNIQUE: Fluoroscopic spot image(s) were submitted for interpretation post-operatively. COMPARISON:  None. FINDINGS: Four low resolution intraoperative spot views of the right hip. Total fluoroscopy time was 11 seconds. Advanced degenerative changes of the right hip. Subsequent images demonstrate right hip replacement with normal alignment. IMPRESSION: Intraoperative fluoroscopic assistance provided during right hip replacement. Electronically Signed   By: Donavan Foil M.D.   On: 09/08/2018 16:23    EKG: Orders placed or performed during the hospital encounter of 09/02/18   EKG   EKG     Hospital Course: Corey [place] is a 68 y.o. who was admitted to [facility]. They were brought to the operating room on 09/08/2018 and underwent Procedure(s): Faxon.  Patient tolerated the procedure well and was later transferred to the recovery room and then to the orthopaedic floor for postoperative care. They were given PO and IV analgesics for pain control following their surgery. They were given 24 hours of postoperative antibiotics of  Anti-infectives (From admission, onward)   Start     Dose/Rate Route Frequency Ordered Stop   09/08/18 1830  ceFAZolin (ANCEF) IVPB 2g/100 mL premix     2 g 200 mL/hr over 30 Minutes Intravenous Every 6 hours 09/08/18 1554 09/09/18 0042   09/08/18 0600  ceFAZolin (ANCEF) 3 g in dextrose 5 % 50 mL IVPB     3 g 100 mL/hr over 30 Minutes Intravenous On call to O.R. 09/07/18 0750 09/08/18 1231     and started on DVT prophylaxis in the form of Aspirin.   PT and OT were ordered for total joint protocol. Discharge planning consulted to help with postop disposition and  equipment needs.  Patient had a good night on the evening of surgery. They started to get up  OOB with therapy on POD #0. Pt was seen during rounds and was ready to go home pending progress with therapy. Hemovac drain was pulled without difficulty. He worked with therapy on POD #1 and was meeting his goals. Pt was discharged to home later that day in stable condition.  Diet: Regular diet Activity: WBAT Follow-up: in 2 weeks Disposition: Home with HEP Discharged Condition: stable   Discharge Instructions    Call MD / Call 911   Complete by: As directed    If you experience chest pain or shortness of breath, CALL 911 and be transported to the hospital emergency room.  If you develope a fever above 101 F, pus (white drainage) or increased drainage or redness at the wound, or calf pain, call your surgeon's office.   Change dressing   Complete by: As directed    You may change your dressing on Friday, then change the dressing daily with sterile 4 x 4 inch gauze dressing and paper tape.   Constipation Prevention   Complete by: As directed    Drink plenty of fluids.  Prune juice may be helpful.  You may use a stool softener, such as Colace (over the counter) 100 mg twice a day.  Use MiraLax (over the counter) for constipation as needed.   Diet - low sodium heart healthy   Complete by: As directed    Discharge instructions   Complete by: As directed    Dr. Gaynelle Arabian Total Joint Specialist [facility] [address] [place], [place] [address] [phone]  ANTERIOR APPROACH TOTAL HIP REPLACEMENT POSTOPERATIVE DIRECTIONS   Hip Rehabilitation, Guidelines Following Surgery  The results of a hip operation are greatly improved after range of motion and muscle strengthening exercises. Follow all safety measures which are given to protect your hip. If any of these exercises cause increased pain or swelling in your joint, decrease the amount until you are comfortable again. Then slowly increase the exercises. Call your caregiver if you have problems or questions.   HOME  CARE INSTRUCTIONS  Remove items at home which could result in a fall. This includes throw rugs or furniture in walking pathways.  ICE to the affected hip every three hours for 30 minutes at a time and then as needed for pain and swelling.  Continue to use ice on the hip for pain and swelling from surgery. You may notice swelling that will progress down to the foot and ankle.  This is normal after surgery.  Elevate the leg when you are not up walking on it.   Continue to use the breathing machine which will help keep your temperature down.  It is common for your temperature to cycle up and down following surgery, especially at night when you are not up moving around and exerting yourself.  The breathing machine keeps your lungs expanded and your temperature down.  DIET You may resume your previous home diet once your are discharged from the hospital.  DRESSING / WOUND CARE / SHOWERING You may change your dressing 3-5 days after surgery.  Then change the dressing every day with sterile gauze.  Please use good hand washing techniques before changing the dressing.  Do not use any lotions or creams on the incision until instructed by your surgeon. You may start showering once you are discharged home but do not submerge the incision under water. Just  pat the incision dry and apply a dry gauze dressing on daily. Change the surgical dressing daily and reapply a dry dressing each time.  ACTIVITY Walk with your walker as instructed. Use walker as long as suggested by your caregivers. Avoid periods of inactivity such as sitting longer than an hour when not asleep. This helps prevent blood clots.  You may resume a sexual relationship in one month or when given the OK by your doctor.  You may return to work once you are cleared by your doctor.  Do not drive a car for 6 weeks or until released by you surgeon.  Do not drive while taking narcotics.  WEIGHT BEARING Weight bearing as tolerated with assist  device (walker, cane, etc) as directed, use it as long as suggested by your surgeon or therapist, typically at least 4-6 weeks.  POSTOPERATIVE CONSTIPATION PROTOCOL Constipation - defined medically as fewer than three stools per week and severe constipation as less than one stool per week.  One of the most common issues patients have following surgery is constipation.  Even if you have a regular bowel pattern at home, your normal regimen is likely to be disrupted due to multiple reasons following surgery.  Combination of anesthesia, postoperative narcotics, change in appetite and fluid intake all can affect your bowels.  In order to avoid complications following surgery, here are some recommendations in order to help you during your recovery period.  Colace (docusate) - Pick up an over-the-counter form of Colace or another stool softener and take twice a day as long as you are requiring postoperative pain medications.  Take with a full glass of water daily.  If you experience loose stools or diarrhea, hold the colace until you stool forms back up.  If your symptoms do not get better within 1 week or if they get worse, check with your doctor.  Dulcolax (bisacodyl) - Pick up over-the-counter and take as directed by the product packaging as needed to assist with the movement of your bowels.  Take with a full glass of water.  Use this product as needed if not relieved by Colace only.   MiraLax (polyethylene glycol) - Pick up over-the-counter to have on hand.  MiraLax is a solution that will increase the amount of water in your bowels to assist with bowel movements.  Take as directed and can mix with a glass of water, juice, soda, coffee, or tea.  Take if you go more than two days without a movement. Do not use MiraLax more than once per day. Call your doctor if you are still constipated or irregular after using this medication for 7 days in a row.  If you continue to have problems with postoperative  constipation, please contact the office for further assistance and recommendations.  If you experience "the worst abdominal pain ever" or develop nausea or vomiting, please contact the office immediatly for further recommendations for treatment.  ITCHING  If you experience itching with your medications, try taking only a single pain pill, or even half a pain pill at a time.  You can also use Benadryl over the counter for itching or also to help with sleep.   TED HOSE STOCKINGS Wear the elastic stockings on both legs for three weeks following surgery during the day but you may remove then at night for sleeping.  MEDICATIONS See your medication summary on the "After Visit Summary" that the nursing staff will review with you prior to discharge.  You may have some  home medications which will be placed on hold until you complete the course of blood thinner medication.  It is important for you to complete the blood thinner medication as prescribed by your surgeon.  Continue your approved medications as instructed at time of discharge.  PRECAUTIONS If you experience chest pain or shortness of breath - call 911 immediately for transfer to the hospital emergency department.  If you develop a fever greater that 101 F, purulent drainage from wound, increased redness or drainage from wound, foul odor from the wound/dressing, or calf pain - CONTACT YOUR SURGEON.                                                   FOLLOW-UP APPOINTMENTS Make sure you keep all of your appointments after your operation with your surgeon and caregivers. You should call the office at the above phone number and make an appointment for approximately two weeks after the date of your surgery or on the date instructed by your surgeon outlined in the "After Visit Summary".  RANGE OF MOTION AND STRENGTHENING EXERCISES  These exercises are designed to help you keep full movement of your hip joint. Follow your caregiver's or physical  therapist's instructions. Perform all exercises about fifteen times, three times per day or as directed. Exercise both hips, even if you have had only one joint replacement. These exercises can be done on a training (exercise) mat, on the floor, on a table or on a bed. Use whatever works the best and is most comfortable for you. Use music or television while you are exercising so that the exercises are a pleasant break in your day. This will make your life better with the exercises acting as a break in routine you can look forward to.  Lying on your back, slowly slide your foot toward your buttocks, raising your knee up off the floor. Then slowly slide your foot back down until your leg is straight again.  Lying on your back spread your legs as far apart as you can without causing discomfort.  Lying on your side, raise your upper leg and foot straight up from the floor as far as is comfortable. Slowly lower the leg and repeat.  Lying on your back, tighten up the muscle in the front of your thigh (quadriceps muscles). You can do this by keeping your leg straight and trying to raise your heel off the floor. This helps strengthen the largest muscle supporting your knee.  Lying on your back, tighten up the muscles of your buttocks both with the legs straight and with the knee bent at a comfortable angle while keeping your heel on the floor.   IF YOU ARE TRANSFERRED TO A SKILLED REHAB FACILITY If the patient is transferred to a skilled rehab facility following release from the hospital, a list of the current medications will be sent to the facility for the patient to continue.  When discharged from the skilled rehab facility, please have the facility set up the patient's [place] prior to being released. Also, the skilled facility will be responsible for providing the patient with their medications at time of release from the facility to include their pain medication, the muscle relaxants, and  their blood thinner medication. If the patient is still at the rehab facility at time of the two week  follow up appointment, the skilled rehab facility will also need to assist the patient in arranging follow up appointment in our office and any transportation needs.  MAKE SURE YOU:  Understand these instructions.  Get help right away if you are not doing well or get worse.    Pick up stool softner and laxative for home use following surgery while on pain medications. Do not submerge incision under water. Please use good hand washing techniques while changing dressing each day. May shower starting three days after surgery. Please use a clean towel to pat the incision dry following showers. Continue to use ice for pain and swelling after surgery. Do not use any lotions or creams on the incision until instructed by your surgeon.   Do not sit on low chairs, stoools or toilet seats, as it may be difficult to get up from low surfaces   Complete by: As directed    Driving restrictions   Complete by: As directed    No driving for two weeks   TED hose   Complete by: As directed    Use stockings (TED hose) for three weeks on both leg(s).  You may remove them at night for sleeping.   Weight bearing as tolerated   Complete by: As directed      Allergies as of 09/09/2018   No Known Allergies     Medication List    STOP taking these medications   naproxen 500 MG tablet Commonly known as: NAPROSYN     TAKE these medications   allopurinol 300 MG tablet Commonly known as: ZYLOPRIM Take 300 mg by mouth at bedtime.   aspirin 325 MG EC tablet Take 1 tablet (325 mg total) by mouth 2 (two) times daily for 20 days. Then take one 81 mg aspirin once a day for three weeks. Then discontinue aspirin.   CANNABIDIOL PO Take 1-2 tablets by mouth at bedtime as needed (sleep). CBD gummies   colchicine 0.6 MG tablet Take 0.6 mg by mouth at bedtime.   ezetimibe-simvastatin 10-40 MG tablet Commonly  known as: VYTORIN Take 1 tablet by mouth at bedtime.   finasteride 5 MG tablet Commonly known as: PROSCAR Take 5 mg by mouth every evening.   HYDROcodone-acetaminophen 5-325 MG tablet Commonly known as: NORCO/VICODIN Take 1-2 tablets by mouth every 6 (six) hours as needed for severe pain.   methocarbamol 500 MG tablet Commonly known as: ROBAXIN Take 1 tablet (500 mg total) by mouth every 6 (six) hours as needed for muscle spasms.   tamsulosin 0.4 MG Caps capsule Commonly known as: FLOMAX Take 0.4 mg by mouth every evening.   traMADol 50 MG tablet Commonly known as: ULTRAM Take 1 tablet (50 mg total) by mouth every 6 (six) hours as needed for moderate pain.            Discharge Care Instructions  (From admission, onward)         Start     Ordered   09/09/18 0000  Weight bearing as tolerated     09/09/18 0741   09/09/18 0000  Change dressing    Comments: You may change your dressing on Friday, then change the dressing daily with sterile 4 x 4 inch gauze dressing and paper tape.   09/09/18 0741         Follow-up Information    Gaynelle Arabian, MD. Schedule an appointment as soon as possible for a visit on 09/23/2018.   Specialty: Orthopedic Surgery Contact information: [address]  [place] [address] [phone]           Signed: Theresa Duty, PA-C Orthopedic Surgery 09/13/2018, 10:00 AM

## 2019-04-25 ENCOUNTER — Ambulatory Visit: Payer: Medicare Other

## 2019-05-23 ENCOUNTER — Encounter: Payer: Self-pay | Admitting: Neurology

## 2019-05-23 ENCOUNTER — Ambulatory Visit: Payer: Medicare PPO | Admitting: Neurology

## 2019-05-23 ENCOUNTER — Other Ambulatory Visit: Payer: Self-pay

## 2019-05-23 VITALS — BP 132/74 | HR 49 | Temp 97.5°F | Ht 76.0 in | Wt 277.0 lb

## 2019-05-23 DIAGNOSIS — G4733 Obstructive sleep apnea (adult) (pediatric): Secondary | ICD-10-CM | POA: Diagnosis not present

## 2019-05-23 DIAGNOSIS — G5603 Carpal tunnel syndrome, bilateral upper limbs: Secondary | ICD-10-CM | POA: Insufficient documentation

## 2019-05-23 DIAGNOSIS — M167 Other unilateral secondary osteoarthritis of hip: Secondary | ICD-10-CM

## 2019-05-23 DIAGNOSIS — G478 Other sleep disorders: Secondary | ICD-10-CM | POA: Diagnosis not present

## 2019-05-23 DIAGNOSIS — Z9989 Dependence on other enabling machines and devices: Secondary | ICD-10-CM

## 2019-05-23 NOTE — Progress Notes (Signed)
SLEEP MEDICINE CLINIC    Provider:  Larey Seat, MD  Primary Care Physician:  Carlyle Basques, MD [place] [address]     Referring Provider: Carlyle Basques, Md [address] [employer] [address] [place],  [place] [address]          Chief Complaint according to patient   Patient presents with:    . New Patient (Initial Visit)     pt alone, rm 10. pt states that he was set up with a CPAP. last SS completed 2012 through our office. DME [facility] is who he was established with. states that the mask he is using doesnt seem to work well.      HISTORY OF PRESENT ILLNESS:  Corey Ward is a 69 year old Caucasian male patient seen here as a referral on 05/23/2019 from Dr Joylene Draft.  Corey Ward  has a past medical history of Arthritis, Asthma, BPH (benign prostatic hyperplasia), Gout, Hyperlipidemia, OSA (obstructive sleep apnea), PONV (postoperative nausea and vomiting), Pre-diabetes, Sleep apnea, and Urinary retention.  Corey Ward has been diagnosed with obstructive sleep apneas at [facility] sleep in the year 2012 he underwent a sleep study diagnostic on March 20, 2010 and at the time it endorsed the Epworth Sleepiness Scale at 20 points the Beck's depression inventory at 11 point his BMI was 32.3.  He had rather mild apnea with an AHI of 8.3/h but in supine sleep was exacerbated to 54.7/h. There were frequent periodic limb movements noted with an arousal index of 2.2/h.  There was still some movement noted during REM sleep.  He did not have significant hypoxemia only loud snoring. Supine  Sleep accentuated AHI-   He started on CPAP and is using an S9 AutoSet but has been set to 9 cmH2O pressure was 2 cm EPR.  Over the last 30 days his compliance has been excellent at 97% with an average of 6 hours 42 minutes nightly use.  The residual AHI is 0.2/h air leaks are very low the 95th percentile is 0.9 L/h and there are no central apneas emerging.  However the S9  AutoSet is now phased out and he would need a new CPAP in any case.  Current serial number is [id number].  Current fatigue severity score was endorsed at 13 Epworth Sleepiness Scale at 11 points geriatric depression at 2 out of 15 points, the patient's review of systems indicates that he had a lot of knee problems right and left knee had arthroscopic surgery right and left knee have meanwhile been replaced, he also had a Achilles tendon repair and a hip replacement on the right in June 2020.  He has developed hypertension tension diabetes and high cholesterol.  He wakes up with not feeling that he gets enough sleep.  He is also aware that he still snores should he not use his CPAP.     Family medical /sleep history: No other family member on CPAP with OSA.Marland Kitchen    Social history:  Patient is retired from [employer], now Therapist, occupational ed.  Lives in a household with daughter, son lives in [place]- as a professional basketball player Family status is divorced, with 2 children.  Pets are present - dog. Tobacco use: none .  ETOH use; are , Caffeine intake in form of Coffee( none ) Soda( some ) Tea ( none) or energy drinks. Regular exercise in form of walking       Sleep  habits are as follows: The patient's dinner time is between 6 PM. The patient goes to bed at 11 PM and continues to sleep until 4 AM   wakes for one  bathroom breaks,remians restless the reminder of the night.  The preferred sleep position is side , with the support of 2 pillows.  Dreams are reportedly infrequent/vivid. No acid reflux.   6 AM is the usual rise time. The patient wakes up spontaneously without an alarm.  He reports not feeling refreshed or restored in AM, and residual fatigue.  Naps are taken infrequently, lasting from 15-30  minutes .   Review of Systems: Out of a complete 14 system review, the patient complains of only the following symptoms, and all other reviewed systems are negative.:  Fatigue, sleepiness , snoring,  fragmented sleep, Insomnia - early morning arousal.  Patient appears depressed.    How likely are you to doze in the following situations: 0 = not likely, 1 = slight chance, 2 = moderate chance, 3 = high chance   Sitting and Reading? Watching Television? Sitting inactive in a public place (theater or meeting)? As a passenger in a car for an hour without a break? Lying down in the afternoon when circumstances permit? Sitting and talking to someone? Sitting quietly after lunch without alcohol? In a car, while stopped for a few minutes in traffic?   Total = 11/ 24 points   FSS endorsed at 13/ 63 points.   Social History   Socioeconomic History  . Marital status: Married    Spouse name: Not on file  . Number of children: Not on file  . Years of education: Not on file  . Highest education level: Not on file  Occupational History  . Not on file  Tobacco Use  . Smoking status: Never Smoker  . Smokeless tobacco: Never Used  Substance and Sexual Activity  . Alcohol use: No  . Drug use: No  . Sexual activity: Yes  Other Topics Concern  . Not on file  Social History Narrative  . Not on file   Social Determinants of Health   Financial Resource Strain:   . Difficulty of Paying Living Expenses: Not on file  Food Insecurity:   . Worried About Charity fundraiser in the Last Year: Not on file  . Ran Out of Food in the Last Year: Not on file  Transportation Needs:   . Lack of Transportation (Medical): Not on file  . Lack of Transportation (Non-Medical): Not on file  Physical Activity:   . Days of Exercise per Week: Not on file  . Minutes of Exercise per Session: Not on file  Stress:   . Feeling of Stress : Not on file  Social Connections:   . Frequency of Communication with Friends and Family: Not on file  . Frequency of Social Gatherings with Friends and Family: Not on file  . Attends Religious Services: Not on file  . Active Member of Clubs or Organizations: Not on file    . Attends Archivist Meetings: Not on file  . Marital Status: Not on file    Family History  Problem Relation Age of Onset  . Heart disease Father   . Colon cancer Neg Hx   . Esophageal cancer Neg Hx   . Rectal cancer Neg Hx   . Stomach cancer Neg Hx     Past Medical History:  Diagnosis Date  . Arthritis   . Asthma    When  younger no problem now  . BPH (benign prostatic hyperplasia)   . Gout   . Hyperlipidemia   . OSA (obstructive sleep apnea)   . PONV (postoperative nausea and vomiting)   . Pre-diabetes   . Sleep apnea   . Urinary retention     Past Surgical History:  Procedure Laterality Date  . calcium deposits removed from ankle     Left  . INGUINAL HERNIA REPAIR Right   . KNEE SURGERY     arthroscopic  surg  . REPLACEMENT TOTAL KNEE Bilateral   . TENDON REPAIR Right    foot  . TONSILLECTOMY    . TOTAL HIP ARTHROPLASTY Right 09/08/2018   Procedure: TOTAL HIP ARTHROPLASTY ANTERIOR APPROACH;  Surgeon: Gaynelle Arabian, MD;  Location: [facility];  Service: Orthopedics;  Laterality: Right;  117min     Current Outpatient Medications on File Prior to Visit  Medication Sig Dispense Refill  . allopurinol (ZYLOPRIM) 300 MG tablet Take 300 mg by mouth at bedtime.     Marland Kitchen CANNABIDIOL PO Take 1-2 tablets by mouth at bedtime as needed (sleep). CBD gummies    . colchicine 0.6 MG tablet Take 0.6 mg by mouth at bedtime.     Marland Kitchen ezetimibe-simvastatin (VYTORIN) 10-40 MG per tablet Take 1 tablet by mouth at bedtime.    . finasteride (PROSCAR) 5 MG tablet Take 5 mg by mouth every evening.     . tamsulosin (FLOMAX) 0.4 MG CAPS Take 0.4 mg by mouth every evening.      No current facility-administered medications on file prior to visit.    No Known Allergies  Physical exam:  Today's Vitals   05/23/19 1126  BP: 132/74  Pulse: (!) 49  Temp: (!) 97.5 F (36.4 C)  Weight: 277 lb (125.6 kg)  Height: 6\' 4"  (1.93 m)   Body mass index is 33.72 kg/m.   Wt Readings from  Last 3 Encounters:  05/23/19 277 lb (125.6 kg)  09/08/18 272 lb 14.9 oz (123.8 kg)  09/02/18 273 lb (123.8 kg)     Ht Readings from Last 3 Encounters:  05/23/19 6\' 4"  (1.93 m)  09/08/18 6\' 4"  (1.93 m)  09/02/18 6\' 4"  (1.93 m)      General: The patient is awake, alert and appears not in acute distress. The patient is well groomed. Head: Normocephalic, atraumatic. Neck is supple. Mallampati 4 neck circumference: 17 inches . Nasal airflow constricted .  Retrognathia is  seen.  Dental status: intact  Cardiovascular:  Regular rate and cardiac rhythm by pulse,  without distended neck veins. Respiratory: Lungs are clear to auscultation.  Skin:  Without evidence of ankle edema, or rash. Trunk: The patient's posture is erect.   Neurologic exam : The patient is awake and alert, oriented to place and time.   Memory subjective described as intact.  Attention span & concentration ability appears normal.  Speech is fluent,  without  dysarthria, dysphonia or aphasia.  Mood and affect are appropriate.   Cranial nerves: no loss of smell or taste reported  Pupils are equal and briskly reactive to light. Funduscopic exam deferred.  Extraocular movements in vertical and horizontal planes were intact and without nystagmus. No Diplopia. Visual fields by finger perimetry are intact. Hearing was intact to soft voice and finger rubbing.    Facial sensation intact to fine touch.  Facial motor strength is symmetric and tongue and uvula move midline.  Neck ROM : rotation, tilt and flexion extension were normal for age and  shoulder shrug was symmetrical.    Motor exam:  Symmetric bulk, tone and ROM.   Normal tone without cog wheeling, symmetric grip strength .   Sensory:  Fine touch, pinprick and vibration were tested  and  normal.  Proprioception tested in the upper extremities was normal. Coordination: Rapid alternating movements in the fingers/hands were of normal speed.  The Finger-to-nose maneuver  was intact without evidence of ataxia, dysmetria or tremor. Gait and station: Patient could rise unassisted from a seated position, walked without assistive device.  Stance is of normal width/ base and the patient turned with 3 steps.  Toe and heel walk were deferred.  Deep tendon reflexes: in the  upper and lower extremities are symmetric and intact.  Babinski response was deferred .   I reviewed the previous sleep study and especially the high depression score at the time.      After spending a total time of 45 minutes face to face and additional time for physical and neurologic examination, review of laboratory studies,  personal review of imaging studies, reports and results of other testing and review of referral information / records as far as provided in visit, I have established the following assessments:  1)  OSA, was mild in his last documented sleep study and the settings from that time have still controlled apnea well.  2)  non restorative sleep -depession, hands falling asleep " - carpal tunnel was dx but mild.  Needs c spine evaluation.  3)  Obesity - BMI 33.  narrow upper airway.    My Plan is to proceed with:  1) HST - for home use, screening for apnea.  2) needs a new machine - auto CPAP should be used.  3) fitting fr anew interface is not needed, no air leaks.  4) patient denies depression.   I would like to thank Dr Joylene Draft, MD, for allowing me to meet with and to take care of this pleasant patient.   In short, Corey Ward is presenting with non restorative sleep , using an old CPAP,  I plan to follow up either personally or through our NP within 2-3  month.     Electronically signed by: Larey Seat, MD 05/23/2019 11:44 AM  [facility] Board certified by The [employer] of Sleep Medicine and Diplomate of the [employer] of Sleep Medicine. Board certified In Neurology through the [place], Fellow of the [employer] of Neurology. Medical Director of [employer].

## 2019-05-23 NOTE — Patient Instructions (Signed)

## 2019-05-24 ENCOUNTER — Encounter: Payer: Self-pay | Admitting: Neurology

## 2019-07-04 ENCOUNTER — Ambulatory Visit (INDEPENDENT_AMBULATORY_CARE_PROVIDER_SITE_OTHER): Payer: Medicare PPO | Admitting: Neurology

## 2019-07-04 DIAGNOSIS — G478 Other sleep disorders: Secondary | ICD-10-CM

## 2019-07-04 DIAGNOSIS — G5603 Carpal tunnel syndrome, bilateral upper limbs: Secondary | ICD-10-CM

## 2019-07-04 DIAGNOSIS — M167 Other unilateral secondary osteoarthritis of hip: Secondary | ICD-10-CM

## 2019-07-04 DIAGNOSIS — Z9989 Dependence on other enabling machines and devices: Secondary | ICD-10-CM

## 2019-07-04 DIAGNOSIS — G4733 Obstructive sleep apnea (adult) (pediatric): Secondary | ICD-10-CM | POA: Diagnosis not present

## 2019-07-12 NOTE — Progress Notes (Signed)
Summary & Diagnosis:   Current AHI is under 5/h which would not require CPAP  intervention. Valid sleep time was just over 4 hours, which may  affect REM sleep proportion and thereby REM dependent apneas.  Based on this HST there is no need for further treatment, yet the  patient reports increasing levels of fatigue.   Recommendations:    The treatment of other conditions would be important in order to  address fatigue and non-restorative sleep.  Interpreting Physician: Larey Seat, MD

## 2019-07-12 NOTE — Procedures (Addendum)
Patient Information     First Name: Corey Last Name: Ward ID: [id number]  Birth Date: [birth year] Age: 69 Gender: Male  Referring Provider: Crist Infante, MD BMI: 33.8 (W=278 lb, H=6' 4'')  Neck Circ.:  17 '' Epworth:  11/24   Sleep Study Information    Study Date: Jul 04, 2019 S/H/A Version: 001.001.001.001 / 4.0.1515 / 77  History:    Corey Ward  had been diagnosed with obstructive sleep apneas at [facility] sleep in the year 2012 where he underwent a sleep study on March 20, 2010 after he endorsed the Epworth Sleepiness Scale at 20 points, the Beck's depression inventory at 11 point; his BMI was 32.3 kg/m2.  He had rather mild apnea with an AHI of 8.3/h but in supine sleep was exacerbated to 54.7/h. There were frequent periodic limb movements noted with an arousal index of 2.2/h.  There was still some movement noted during REM sleep.  He did not have significant hypoxemia only loud snoring. Supine Sleep accentuated AHI- He started on CPAP and is using an S9 AutoSet ever since - this 69 year- old machine has been set to 9 cmH2O pressure was 2 cm EPR.  Over the last 30 days his compliance has been excellent at 97% with an average of 6 hours 42 minutes nightly use.  The residual AHI is 0.2/h air leaks are very low the 95th percentile is 0.9 L/h and there are no central apneas emerging.  However, the S9 AutoSet is now phased out and he would need a new CPAP in any case.   Current serial number is [id number].     Summary & Diagnosis:    Current AHI is under 5/h which would not require CPAP intervention. Valid sleep time was just over 4 hours, which may affect REM sleep proportion and thereby REM dependent apneas. Based on this HST there is no need for further treatment, yet the patient reports increasing levels of fatigue.  Recommendations:     The treatment of other conditions would be important in order to address fatigue and non-restorative sleep.  Interpreting Physician: Larey Seat, MD               Sleep Summary    Oxygen Saturation Statistics     Start Study Time: End Study Time: Total Recording Time:       10:49:35 PM 5:52:51 AM         7 h, 3 min  Total Sleep Time % REM of Sleep Time:  4 h, 46 min  22.2    Mean: 94 Minimum: 91 Maximum: 99  Mean of Desaturations Nadirs (%):   92  Oxygen Desaturation. %: 4-9 10-20 >20 Total  Events Number Total  3 100.0  0 0.0  0 0.0  3 100.0  Oxygen Saturation: <90 <=88 <85 <80 <70  Duration (minutes): Sleep % 0.0 0.0 0.0 0.0 0.0 0.0 0.0 0.0 0.0 0.0     Respiratory Indices      Total Events REM NREM All Night  pRDI:  30  pAHI:  14 ODI:  3  pAHIc:  0  % CSR: 0.0 13.9 5.8 2.3 0.0 5.0 2.5 0.3 0.0 6.7 3.1 0.7 0.0       Pulse Rate Statistics during Sleep (BPM)      Mean: 46 Minimum: 40 Maximum: 77    Indices are calculated using technically valid sleep time of 4 h, 28 min. pRDI/pAHI are calculated using 02 desaturations ? 3%  Body Position Statistics  Position Supine Prone Right Left Non-Supine  Sleep (min) 65.0 0.0 73.5 147.8 221.3  Sleep % 22.7 0.0 25.7 51.6 77.3  pRDI 6.5 N/A 6.3 7.0 6.8  pAHI 0.9 N/A 2.1 4.5 3.8  ODI 0.0 N/A 1.1 0.8 0.9     Snoring Statistics Snoring Level (dB) >40 >50 >60 >70 >80 >Threshold (45)  Sleep (min) 41.6 3.9 1.3 0.2 0.0 7.6  Sleep % 14.5 1.4 0.4 0.1 0.0 2.6    Mean: 41 dB Sleep Stages Chart

## 2019-07-13 ENCOUNTER — Telehealth: Payer: Self-pay | Admitting: Neurology

## 2019-07-13 NOTE — Telephone Encounter (Signed)
Called patient to discuss sleep study results. No answer at this time. LVM for the patient to call back.   

## 2019-07-13 NOTE — Telephone Encounter (Signed)
Pt returned call and I was able to review the home sleep test in detail with the patient. Advised that based off this study the apnea events were under 5. Asked the patient if the purpose was to get a new machine and him continue to use the CPAP or was he hoping that apnea would be better and would no longer need CPAP. Patient states that he has lost weight since last study and he has been more recently sleeping better. Advised the patient that with the sleep study indicating that apnea was below 5 we would not be able to get him approved for a new CPAP machine. Advised that after discussing with Robin our sleep lab manager, what she recommended is if the patient was willing we could not charge for the HST and bring him in to assess for apnea in the in lab study. At this time the patient declined doing the in lab and would like to see how he does without sleeping with cpap. Advised the patient if he finds that he is not sleeping well without CPAP or he has fatigue that remains then he can contact our office and we can reassess the in lab study at that time. Patient verbalized understanding and had no further questions.

## 2019-07-13 NOTE — Telephone Encounter (Signed)
-----   Message from Larey Seat, MD sent at 07/12/2019  8:36 AM EDT ----- Summary & Diagnosis:   Current AHI is under 5/h which would not require CPAP  intervention. Valid sleep time was just over 4 hours, which may  affect REM sleep proportion and thereby REM dependent apneas.  Based on this HST there is no need for further treatment, yet the  patient reports increasing levels of fatigue.   Recommendations:    The treatment of other conditions would be important in order to  address fatigue and non-restorative sleep.  Interpreting Physician: Larey Seat, MD

## 2019-11-18 DIAGNOSIS — Z96641 Presence of right artificial hip joint: Secondary | ICD-10-CM | POA: Diagnosis not present

## 2019-11-18 DIAGNOSIS — Z471 Aftercare following joint replacement surgery: Secondary | ICD-10-CM | POA: Diagnosis not present

## 2019-12-12 DIAGNOSIS — M109 Gout, unspecified: Secondary | ICD-10-CM | POA: Diagnosis not present

## 2019-12-12 DIAGNOSIS — Z125 Encounter for screening for malignant neoplasm of prostate: Secondary | ICD-10-CM | POA: Diagnosis not present

## 2019-12-12 DIAGNOSIS — E119 Type 2 diabetes mellitus without complications: Secondary | ICD-10-CM | POA: Diagnosis not present

## 2019-12-12 DIAGNOSIS — E785 Hyperlipidemia, unspecified: Secondary | ICD-10-CM | POA: Diagnosis not present

## 2019-12-12 DIAGNOSIS — E291 Testicular hypofunction: Secondary | ICD-10-CM | POA: Diagnosis not present

## 2019-12-19 DIAGNOSIS — E790 Hyperuricemia without signs of inflammatory arthritis and tophaceous disease: Secondary | ICD-10-CM | POA: Diagnosis not present

## 2019-12-19 DIAGNOSIS — E785 Hyperlipidemia, unspecified: Secondary | ICD-10-CM | POA: Diagnosis not present

## 2019-12-19 DIAGNOSIS — G5600 Carpal tunnel syndrome, unspecified upper limb: Secondary | ICD-10-CM | POA: Diagnosis not present

## 2019-12-19 DIAGNOSIS — E1169 Type 2 diabetes mellitus with other specified complication: Secondary | ICD-10-CM | POA: Diagnosis not present

## 2019-12-19 DIAGNOSIS — Z0000 Encounter for general adult medical examination without abnormal findings: Secondary | ICD-10-CM | POA: Diagnosis not present

## 2019-12-19 DIAGNOSIS — E291 Testicular hypofunction: Secondary | ICD-10-CM | POA: Diagnosis not present

## 2019-12-19 DIAGNOSIS — G4730 Sleep apnea, unspecified: Secondary | ICD-10-CM | POA: Diagnosis not present

## 2019-12-19 DIAGNOSIS — M109 Gout, unspecified: Secondary | ICD-10-CM | POA: Diagnosis not present

## 2019-12-19 DIAGNOSIS — N401 Enlarged prostate with lower urinary tract symptoms: Secondary | ICD-10-CM | POA: Diagnosis not present

## 2019-12-19 DIAGNOSIS — R82998 Other abnormal findings in urine: Secondary | ICD-10-CM | POA: Diagnosis not present

## 2019-12-22 ENCOUNTER — Other Ambulatory Visit: Payer: Self-pay | Admitting: Internal Medicine

## 2019-12-22 DIAGNOSIS — E785 Hyperlipidemia, unspecified: Secondary | ICD-10-CM

## 2019-12-31 DIAGNOSIS — Z23 Encounter for immunization: Secondary | ICD-10-CM | POA: Diagnosis not present

## 2020-01-13 ENCOUNTER — Other Ambulatory Visit: Payer: Medicare PPO

## 2020-01-30 DIAGNOSIS — N5201 Erectile dysfunction due to arterial insufficiency: Secondary | ICD-10-CM | POA: Diagnosis not present

## 2020-01-30 DIAGNOSIS — N401 Enlarged prostate with lower urinary tract symptoms: Secondary | ICD-10-CM | POA: Diagnosis not present

## 2020-01-30 DIAGNOSIS — L570 Actinic keratosis: Secondary | ICD-10-CM | POA: Diagnosis not present

## 2020-01-30 DIAGNOSIS — R351 Nocturia: Secondary | ICD-10-CM | POA: Diagnosis not present

## 2020-01-30 DIAGNOSIS — L245 Irritant contact dermatitis due to other chemical products: Secondary | ICD-10-CM | POA: Diagnosis not present

## 2020-01-31 ENCOUNTER — Ambulatory Visit
Admission: RE | Admit: 2020-01-31 | Discharge: 2020-01-31 | Disposition: A | Discharge status: Home / Self Care | Payer: No Typology Code available for payment source | Source: Ambulatory Visit | Attending: Internal Medicine | Admitting: Internal Medicine

## 2020-01-31 DIAGNOSIS — E785 Hyperlipidemia, unspecified: Secondary | ICD-10-CM

## 2020-02-13 ENCOUNTER — Telehealth: Payer: Self-pay | Admitting: Physician Assistant

## 2020-02-14 NOTE — Telephone Encounter (Signed)
Error

## 2020-04-27 IMAGING — RF OPERATIVE RIGHT HIP WITH PELVIS
1 series · 4 of 4 positions shown · non-contrast
Comparison: None.

CLINICAL DATA: Arthritis hip replacement

EXAM:
OPERATIVE right HIP (WITH PELVIS IF PERFORMED) 4 VIEWS
TECHNIQUE: Fluoroscopic spot image(s) were submitted for interpretation
post-operatively.

[Series 1: unknown protocol · 0.20mm/px · 4 of 4 slices shown]
[im 1/4]
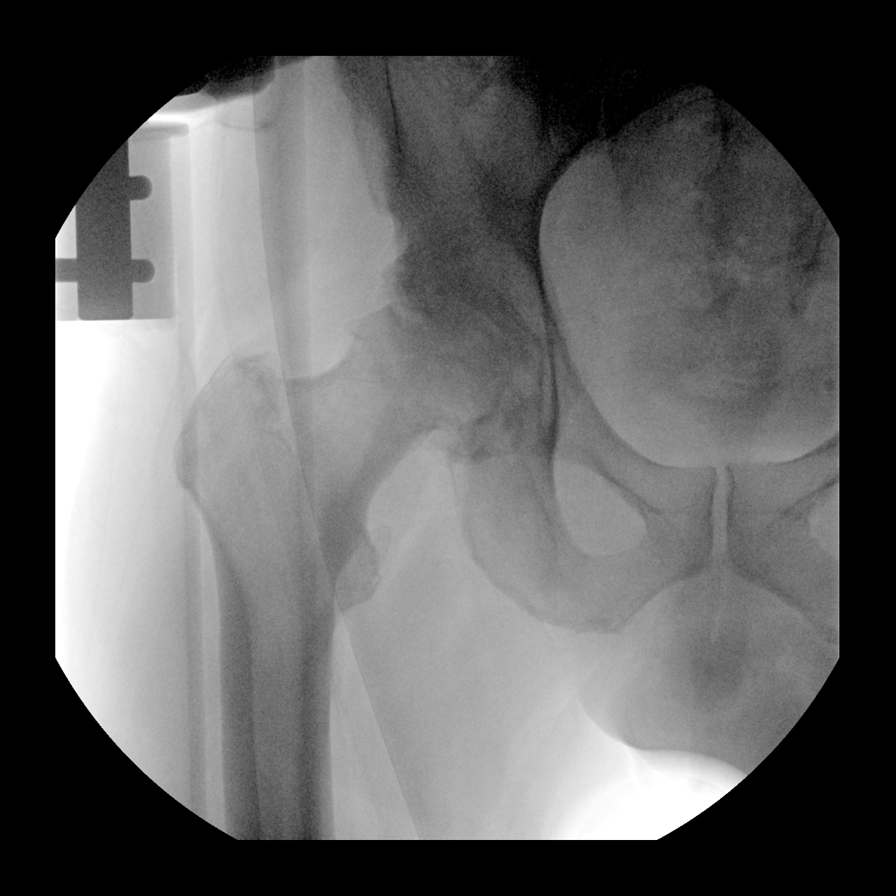
[im 2/4]
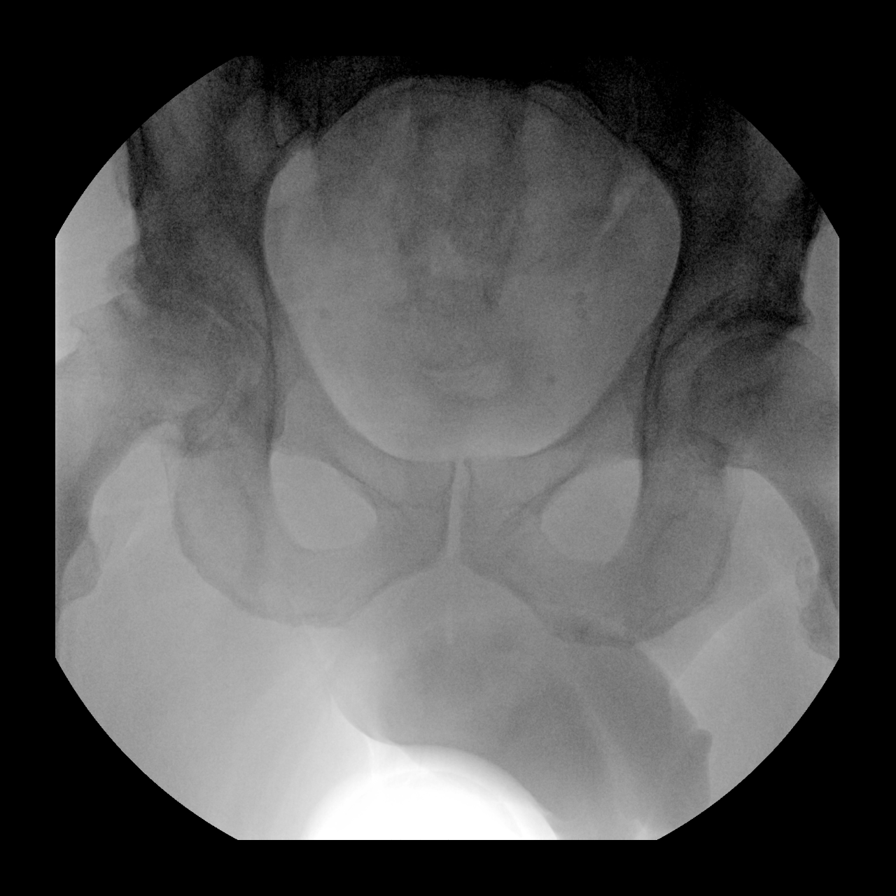
[im 3/4]
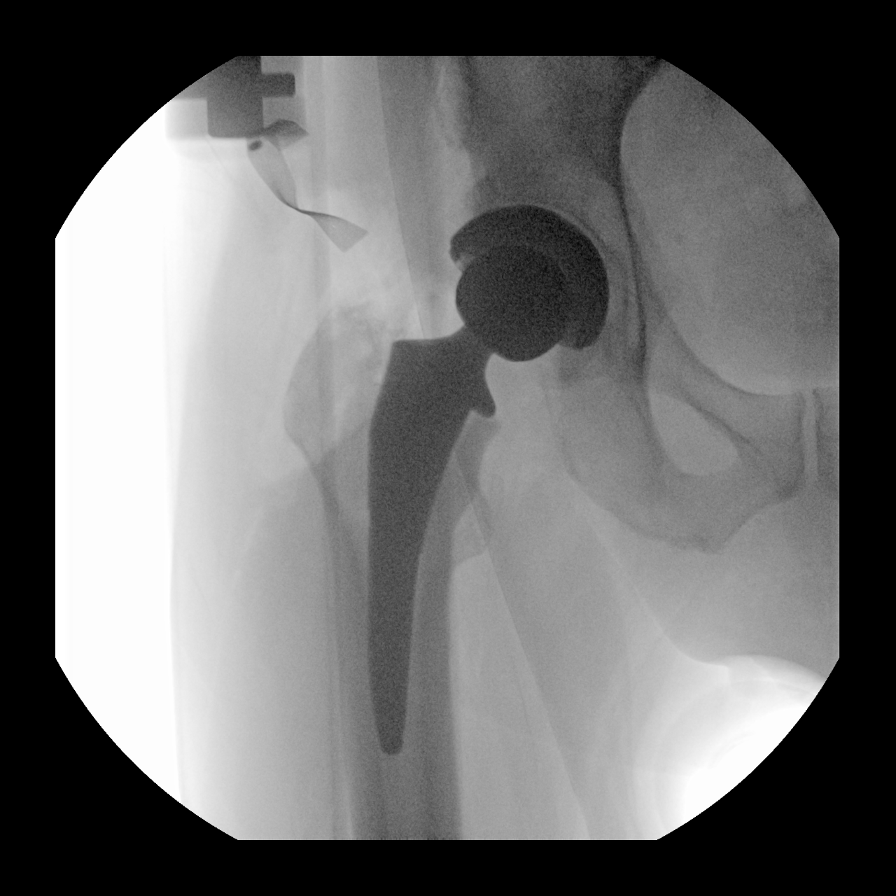
[im 4/4]
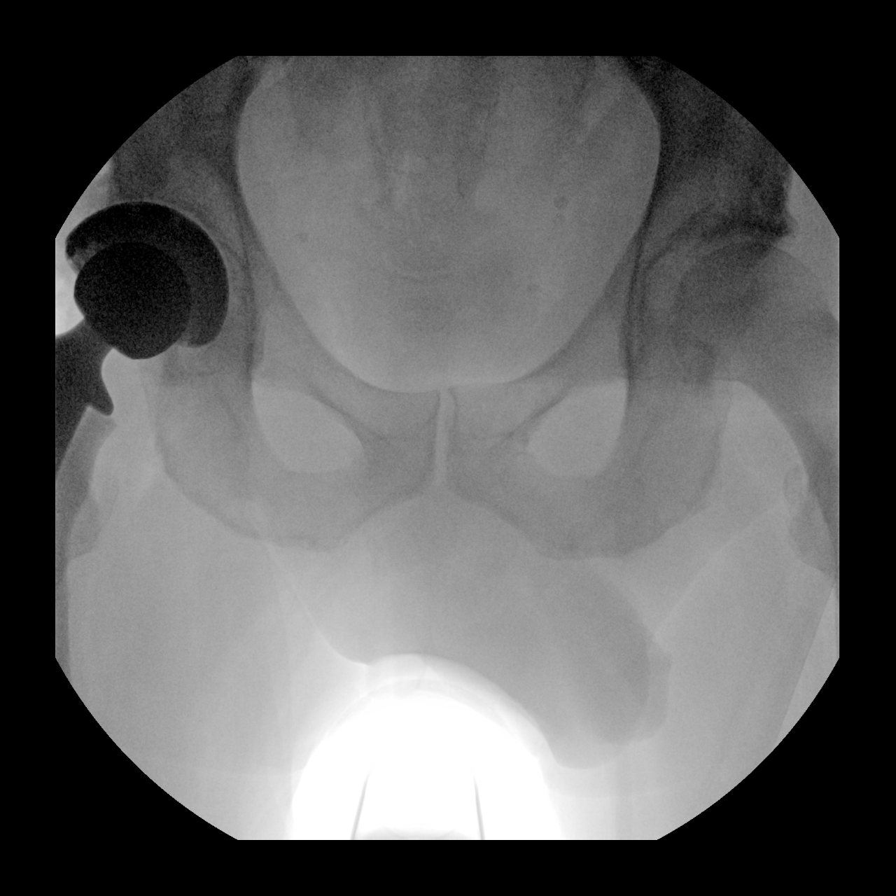

[4 of 4 positions shown; findings below may reference images not displayed]

FINDINGS: Four low resolution intraoperative spot views of the right hip.
Total fluoroscopy time was 11 seconds. Advanced degenerative changes
of the right hip. Subsequent images demonstrate right hip
replacement with normal alignment.
IMPRESSION: Intraoperative fluoroscopic assistance provided during right hip
replacement.

## 2020-05-30 DIAGNOSIS — R200 Anesthesia of skin: Secondary | ICD-10-CM | POA: Diagnosis not present

## 2020-05-30 DIAGNOSIS — M79641 Pain in right hand: Secondary | ICD-10-CM | POA: Diagnosis not present

## 2020-05-30 DIAGNOSIS — M79642 Pain in left hand: Secondary | ICD-10-CM | POA: Diagnosis not present

## 2020-06-18 DIAGNOSIS — R911 Solitary pulmonary nodule: Secondary | ICD-10-CM | POA: Diagnosis not present

## 2020-06-18 DIAGNOSIS — R609 Edema, unspecified: Secondary | ICD-10-CM | POA: Diagnosis not present

## 2020-06-18 DIAGNOSIS — E291 Testicular hypofunction: Secondary | ICD-10-CM | POA: Diagnosis not present

## 2020-06-18 DIAGNOSIS — I2510 Atherosclerotic heart disease of native coronary artery without angina pectoris: Secondary | ICD-10-CM | POA: Diagnosis not present

## 2020-06-18 DIAGNOSIS — M1990 Unspecified osteoarthritis, unspecified site: Secondary | ICD-10-CM | POA: Diagnosis not present

## 2020-06-18 DIAGNOSIS — E790 Hyperuricemia without signs of inflammatory arthritis and tophaceous disease: Secondary | ICD-10-CM | POA: Diagnosis not present

## 2020-06-18 DIAGNOSIS — E785 Hyperlipidemia, unspecified: Secondary | ICD-10-CM | POA: Diagnosis not present

## 2020-06-18 DIAGNOSIS — F329 Major depressive disorder, single episode, unspecified: Secondary | ICD-10-CM | POA: Diagnosis not present

## 2020-06-18 DIAGNOSIS — E669 Obesity, unspecified: Secondary | ICD-10-CM | POA: Diagnosis not present

## 2020-06-21 ENCOUNTER — Other Ambulatory Visit: Payer: Self-pay | Admitting: Internal Medicine

## 2020-06-21 DIAGNOSIS — R911 Solitary pulmonary nodule: Secondary | ICD-10-CM

## 2020-07-11 ENCOUNTER — Other Ambulatory Visit: Payer: Medicare PPO

## 2020-07-18 DIAGNOSIS — G5603 Carpal tunnel syndrome, bilateral upper limbs: Secondary | ICD-10-CM | POA: Diagnosis not present

## 2020-08-09 ENCOUNTER — Ambulatory Visit
Admission: RE | Admit: 2020-08-09 | Discharge: 2020-08-09 | Disposition: A | Discharge status: Home / Self Care | Payer: Medicare PPO | Source: Ambulatory Visit | Attending: Internal Medicine | Admitting: Internal Medicine

## 2020-08-09 ENCOUNTER — Other Ambulatory Visit: Payer: Medicare PPO

## 2020-08-09 DIAGNOSIS — R911 Solitary pulmonary nodule: Secondary | ICD-10-CM

## 2020-08-09 DIAGNOSIS — R918 Other nonspecific abnormal finding of lung field: Secondary | ICD-10-CM | POA: Diagnosis not present

## 2020-09-12 DIAGNOSIS — G5601 Carpal tunnel syndrome, right upper limb: Secondary | ICD-10-CM | POA: Diagnosis not present

## 2020-12-12 ENCOUNTER — Other Ambulatory Visit: Payer: Self-pay | Admitting: Orthopedic Surgery

## 2020-12-12 DIAGNOSIS — G5603 Carpal tunnel syndrome, bilateral upper limbs: Secondary | ICD-10-CM | POA: Diagnosis not present

## 2020-12-13 ENCOUNTER — Encounter (HOSPITAL_BASED_OUTPATIENT_CLINIC_OR_DEPARTMENT_OTHER): Payer: Self-pay | Admitting: Orthopedic Surgery

## 2020-12-13 ENCOUNTER — Other Ambulatory Visit: Payer: Self-pay

## 2020-12-15 DIAGNOSIS — Z23 Encounter for immunization: Secondary | ICD-10-CM | POA: Diagnosis not present

## 2020-12-17 ENCOUNTER — Encounter (HOSPITAL_BASED_OUTPATIENT_CLINIC_OR_DEPARTMENT_OTHER)
Admission: RE | Admit: 2020-12-17 | Discharge: 2020-12-17 | Disposition: A | Discharge status: Home / Self Care | Payer: Medicare PPO | Source: Ambulatory Visit | Attending: Orthopedic Surgery | Admitting: Orthopedic Surgery

## 2020-12-17 DIAGNOSIS — Z01812 Encounter for preprocedural laboratory examination: Secondary | ICD-10-CM | POA: Insufficient documentation

## 2020-12-17 LAB — BASIC METABOLIC PANEL
Anion gap: 8 (ref 5–15)
BUN: 11 mg/dL (ref 8–23)
CO2: 26 mmol/L (ref 22–32)
Calcium: 9.2 mg/dL (ref 8.9–10.3)
Chloride: 104 mmol/L (ref 98–111)
Creatinine, Ser: 0.82 mg/dL (ref 0.61–1.24)
GFR, Estimated: >60 mL/min (ref >60)
Glucose, Bld: 118 mg/dL — ABNORMAL HIGH (ref 70–99)
Potassium: 5 mmol/L (ref 3.5–5.1)
Sodium: 138 mmol/L (ref 135–145)

## 2020-12-17 NOTE — Progress Notes (Signed)

## 2020-12-21 ENCOUNTER — Encounter (HOSPITAL_BASED_OUTPATIENT_CLINIC_OR_DEPARTMENT_OTHER): Payer: Self-pay | Admitting: Orthopedic Surgery

## 2020-12-21 ENCOUNTER — Ambulatory Visit (HOSPITAL_BASED_OUTPATIENT_CLINIC_OR_DEPARTMENT_OTHER): Payer: Medicare PPO | Admitting: Certified Registered"

## 2020-12-21 ENCOUNTER — Ambulatory Visit (HOSPITAL_BASED_OUTPATIENT_CLINIC_OR_DEPARTMENT_OTHER)
Admission: RE | Admit: 2020-12-21 | Discharge: 2020-12-21 | Disposition: A | Discharge status: Home / Self Care | Payer: Medicare PPO | Source: Ambulatory Visit | Attending: Orthopedic Surgery | Admitting: Orthopedic Surgery

## 2020-12-21 ENCOUNTER — Other Ambulatory Visit: Payer: Self-pay

## 2020-12-21 ENCOUNTER — Encounter (HOSPITAL_BASED_OUTPATIENT_CLINIC_OR_DEPARTMENT_OTHER)
Admission: RE | Disposition: A | Discharge status: Home / Self Care | Payer: Self-pay | Source: Ambulatory Visit | Attending: Orthopedic Surgery

## 2020-12-21 DIAGNOSIS — E119 Type 2 diabetes mellitus without complications: Secondary | ICD-10-CM | POA: Diagnosis not present

## 2020-12-21 DIAGNOSIS — G5601 Carpal tunnel syndrome, right upper limb: Secondary | ICD-10-CM | POA: Insufficient documentation

## 2020-12-21 DIAGNOSIS — G4733 Obstructive sleep apnea (adult) (pediatric): Secondary | ICD-10-CM | POA: Diagnosis not present

## 2020-12-21 DIAGNOSIS — E785 Hyperlipidemia, unspecified: Secondary | ICD-10-CM | POA: Diagnosis not present

## 2020-12-21 DIAGNOSIS — Z9989 Dependence on other enabling machines and devices: Secondary | ICD-10-CM | POA: Diagnosis not present

## 2020-12-21 HISTORY — DX: Type 2 diabetes mellitus without complications: E11.9

## 2020-12-21 HISTORY — PX: CARPAL TUNNEL RELEASE: SHX101

## 2020-12-21 LAB — GLUCOSE, CAPILLARY
Glucose-Capillary: 104 mg/dL — ABNORMAL HIGH (ref 70–99)
Glucose-Capillary: 132 mg/dL — ABNORMAL HIGH (ref 70–99)

## 2020-12-21 SURGERY — CARPAL TUNNEL RELEASE
Anesthesia: Regional | Site: Wrist | Laterality: Right

## 2020-12-21 MED ORDER — FENTANYL CITRATE (PF) 100 MCG/2ML IJ SOLN: 25.0000 ug | INTRAMUSCULAR | Status: DC | PRN

## 2020-12-21 MED ORDER — FENTANYL CITRATE (PF) 100 MCG/2ML IJ SOLN
INTRAMUSCULAR | Status: DC | PRN
  Administered 2020-12-21 (×2): 25 ug via INTRAVENOUS

## 2020-12-21 MED ORDER — FENTANYL CITRATE (PF) 100 MCG/2ML IJ SOLN
INTRAMUSCULAR | Status: AC
  Filled 2020-12-21: qty 2

## 2020-12-21 MED ORDER — ONDANSETRON HCL 4 MG/2ML IJ SOLN
INTRAMUSCULAR | Status: DC | PRN
  Administered 2020-12-21: 4 mg via INTRAVENOUS

## 2020-12-21 MED ORDER — TRAMADOL HCL 50 MG PO TABS: 50.0000 mg | ORAL_TABLET | Freq: Four times a day (QID) | ORAL | 0 refills | Status: DC | PRN

## 2020-12-21 MED ORDER — MIDAZOLAM HCL 2 MG/2ML IJ SOLN
2.0000 mg | Freq: Once | INTRAMUSCULAR | Status: AC
  Administered 2020-12-21: 2 mg via INTRAVENOUS

## 2020-12-21 MED ORDER — CEFAZOLIN IN SODIUM CHLORIDE 3-0.9 GM/100ML-% IV SOLN
3.0000 g | INTRAVENOUS | Status: AC
  Administered 2020-12-21: 3 g via INTRAVENOUS

## 2020-12-21 MED ORDER — CEFAZOLIN SODIUM-DEXTROSE 1-4 GM/50ML-% IV SOLN
INTRAVENOUS | Status: AC
  Filled 2020-12-21: qty 50

## 2020-12-21 MED ORDER — AMISULPRIDE (ANTIEMETIC) 5 MG/2ML IV SOLN: 10.0000 mg | Freq: Once | INTRAVENOUS | Status: DC | PRN

## 2020-12-21 MED ORDER — ONDANSETRON HCL 4 MG/2ML IJ SOLN: 4.0000 mg | Freq: Once | INTRAMUSCULAR | Status: DC | PRN

## 2020-12-21 MED ORDER — ACETAMINOPHEN 10 MG/ML IV SOLN: 1000.0000 mg | Freq: Once | INTRAVENOUS | Status: DC | PRN

## 2020-12-21 MED ORDER — KETOROLAC TROMETHAMINE 15 MG/ML IJ SOLN: 15.0000 mg | Freq: Once | INTRAMUSCULAR | Status: DC | PRN

## 2020-12-21 MED ORDER — PROPOFOL 10 MG/ML IV BOLUS
INTRAVENOUS | Status: DC | PRN
  Administered 2020-12-21 (×4): 20 mg via INTRAVENOUS
  Administered 2020-12-21: 120 mg via INTRAVENOUS

## 2020-12-21 MED ORDER — MIDAZOLAM HCL 2 MG/2ML IJ SOLN
INTRAMUSCULAR | Status: AC
  Filled 2020-12-21: qty 2

## 2020-12-21 MED ORDER — LIDOCAINE-EPINEPHRINE (PF) 1.5 %-1:200000 IJ SOLN
INTRAMUSCULAR | Status: DC | PRN
  Administered 2020-12-21: 30 mL via PERINEURAL

## 2020-12-21 MED ORDER — BUPIVACAINE HCL (PF) 0.25 % IJ SOLN
INTRAMUSCULAR | Status: DC | PRN
  Administered 2020-12-21: 9 mL

## 2020-12-21 MED ORDER — PROPOFOL 500 MG/50ML IV EMUL
INTRAVENOUS | Status: DC | PRN
  Administered 2020-12-21: 200 ug/kg/min via INTRAVENOUS
  Administered 2020-12-21: 120 ug/kg/min via INTRAVENOUS

## 2020-12-21 MED ORDER — CEFAZOLIN SODIUM-DEXTROSE 2-4 GM/100ML-% IV SOLN
INTRAVENOUS | Status: AC
  Filled 2020-12-21: qty 100

## 2020-12-21 MED ORDER — LACTATED RINGERS IV SOLN: INTRAVENOUS | Status: DC

## 2020-12-21 SURGICAL SUPPLY — 35 items
APL PRP STRL LF DISP 70% ISPRP (MISCELLANEOUS) ×1
BLADE SURG 15 STRL LF DISP TIS (BLADE) ×1 IMPLANT
BLADE SURG 15 STRL SS (BLADE) ×2
BNDG CMPR 9X4 STRL LF SNTH (GAUZE/BANDAGES/DRESSINGS) ×1
BNDG COHESIVE 3X5 TAN ST LF (GAUZE/BANDAGES/DRESSINGS) ×2 IMPLANT
BNDG ESMARK 4X9 LF (GAUZE/BANDAGES/DRESSINGS) ×1 IMPLANT
BNDG GAUZE ELAST 4 BULKY (GAUZE/BANDAGES/DRESSINGS) ×2 IMPLANT
CHLORAPREP W/TINT 26 (MISCELLANEOUS) ×2 IMPLANT
CORD BIPOLAR FORCEPS 12FT (ELECTRODE) ×2 IMPLANT
COVER BACK TABLE 60X90IN (DRAPES) ×2 IMPLANT
COVER MAYO STAND STRL (DRAPES) ×2 IMPLANT
CUFF TOURN SGL QUICK 18X4 (TOURNIQUET CUFF) ×2 IMPLANT
DRAPE EXTREMITY T 121X128X90 (DISPOSABLE) ×2 IMPLANT
DRAPE SURG 17X23 STRL (DRAPES) ×2 IMPLANT
DRSG PAD ABDOMINAL 8X10 ST (GAUZE/BANDAGES/DRESSINGS) ×2 IMPLANT
GAUZE SPONGE 4X4 12PLY STRL (GAUZE/BANDAGES/DRESSINGS) ×2 IMPLANT
GAUZE XEROFORM 1X8 LF (GAUZE/BANDAGES/DRESSINGS) ×2 IMPLANT
GLOVE SURG ORTHO LTX SZ8 (GLOVE) ×2 IMPLANT
GLOVE SURG UNDER POLY LF SZ7 (GLOVE) ×1 IMPLANT
GLOVE SURG UNDER POLY LF SZ8.5 (GLOVE) ×2 IMPLANT
GOWN STRL REUS W/ TWL LRG LVL3 (GOWN DISPOSABLE) ×1 IMPLANT
GOWN STRL REUS W/TWL LRG LVL3 (GOWN DISPOSABLE) ×2
GOWN STRL REUS W/TWL XL LVL3 (GOWN DISPOSABLE) ×2 IMPLANT
NDL PRECISIONGLIDE 27X1.5 (NEEDLE) IMPLANT
NEEDLE PRECISIONGLIDE 27X1.5 (NEEDLE) ×2 IMPLANT
NS IRRIG 1000ML POUR BTL (IV SOLUTION) ×2 IMPLANT
PACK BASIN DAY SURGERY FS (CUSTOM PROCEDURE TRAY) ×2 IMPLANT
SLING ARM FOAM STRAP XLG (SOFTGOODS) ×1 IMPLANT
STOCKINETTE 4X48 STRL (DRAPES) ×2 IMPLANT
SUT ETHILON 4 0 PS 2 18 (SUTURE) ×2 IMPLANT
SUT VICRYL 4-0 PS2 18IN ABS (SUTURE) IMPLANT
SYR BULB EAR ULCER 3OZ GRN STR (SYRINGE) ×2 IMPLANT
SYR CONTROL 10ML LL (SYRINGE) IMPLANT
TOWEL GREEN STERILE FF (TOWEL DISPOSABLE) ×2 IMPLANT
UNDERPAD 30X36 HEAVY ABSORB (UNDERPADS AND DIAPERS) ×2 IMPLANT

## 2020-12-21 NOTE — Transfer of Care (Signed)
Immediate Anesthesia Transfer of Care Note  Patient: Corey Ward  Procedure(s) Performed: RIGHT CARPAL TUNNEL RELEASE (Right: Wrist)  Patient Location: PACU  Anesthesia Type:General and Regional  Level of Consciousness: drowsy  Airway & Oxygen Therapy: Patient Spontanous Breathing and Patient connected to face mask oxygen  Post-op Assessment: Report given to RN and Post -op Vital signs reviewed and stable  Post vital signs: Reviewed and stable  Last Vitals:  Vitals Value Taken Time  BP 105/63 12/21/20 1412  Temp    Pulse 69 12/21/20 1413  Resp 15 12/21/20 1413  SpO2 92 % 12/21/20 1413  Vitals shown include unvalidated device data.  Last Pain:  Vitals:   12/21/20 1258  TempSrc: Oral  PainSc: 0-No pain         Complications: No notable events documented.

## 2020-12-21 NOTE — H&P (Signed)
Corey Ward is an 70 y.o. male.   Chief Complaint: numbness right hand HPI: Flemon is a 70 year old former patient who is right-hand dominant last seen in 2018 for numbness and tingling. He had nerve conductions done in 2018 revealing a carpal tunnel syndrome bilaterally. He states over the past week he had significant pain numbness and tingling in his right arm from his mid humerus distally. He took Naprosyn which has relieved it. He states he stopped taking it and he began having symptoms again decided to come in. He has no new history of injury. He states his left hand also gets some discomfort but not nearly as bad as the right. He has awakened occasionally at night. He has no known history of injury to no discrete history of injury to his neck. He does have a history of arthritis and gout plus diabetes. He states he used to be able to sleep on his stomach but doing so now because of pain in his neck.He was referred for nerve conduction with Dr. Tamsen Roers. He has a history of gout and CMC arthritis along with cervical arthritis. His nerve conductions been done by Dr. Tamsen Roers revealing a bilateral carpal tunnel syndromes with a motor delay of 5 3 on his right 4 5 on his left sensory delay of 541 100s right 3 6 on his both nerves were enlarged at 14 mm. His last nerve conductions showed slight deterioration on both sides. He has had an injection to the right carpal canal . States that the injection gave him excellent relief for period of time but that has worn off. Past Medical History:  Diagnosis Date   Arthritis    Asthma    When younger no problem now   BPH (benign prostatic hyperplasia)    Diabetes mellitus without complication (HCC)    Gout    Hyperlipidemia    OSA (obstructive sleep apnea)    PONV (postoperative nausea and vomiting)    Sleep apnea    Urinary retention     Past Surgical History:  Procedure Laterality Date   calcium deposits removed from ankle     Left   INGUINAL HERNIA  REPAIR Right    KNEE SURGERY     arthroscopic  surg   REPLACEMENT TOTAL KNEE Bilateral    TENDON REPAIR Right    foot   TONSILLECTOMY     TOTAL HIP ARTHROPLASTY Right 09/08/2018   Procedure: TOTAL HIP ARTHROPLASTY ANTERIOR APPROACH;  Surgeon: Gaynelle Arabian, MD;  Location: [facility];  Service: Orthopedics;  Laterality: Right;  162min    Family History  Problem Relation Age of Onset   Heart disease Father    Colon cancer Neg Hx    Esophageal cancer Neg Hx    Rectal cancer Neg Hx    Stomach cancer Neg Hx    Social History:  reports that he has never smoked. He has never used smokeless tobacco. He reports that he does not drink alcohol and does not use drugs.  Allergies: No Known Allergies  No medications prior to admission.    No results found for this or any previous visit (from the past 48 hour(s)).  No results found.   Pertinent items are noted in HPI.  Height 6\' 4"  (1.93 m), weight 117.9 kg.  General appearance: alert, cooperative, and appears stated age Head: Normocephalic, without obvious abnormality Neck: no JVD Resp: clear to auscultation bilaterally Cardio: regular rate and rhythm, S1, S2 normal, no murmur, click, rub or gallop GI:  soft, non-tender; bowel sounds normal; no masses,  no organomegaly Extremities: numbness right hand Pulses: 2+ and symmetric Skin: Skin color, texture, turgor normal. No rashes or lesions Neurologic: Grossly normal Incision/Wound: na  Assessment/Plan Assessment:  1. Bilateral carpal tunnel syndrome    Plan: He did not seek follow-up on his neck referral. He states that the injection gave him excellent relief and he does not feel it be worthwhile. We have discussed surgical release of the median nerve right hand. He is advised that there is no guarantee to the surgery the possibility that this could be a double crush and release of the hand may not resolve symptoms for him. He is advised preperi-and postoperative course. He is advised  that there is a potential for incomplete relief of symptoms potential for injury to arteries nerves tendons possibility of dystrophy. He would like to call back to schedule carpal tunnel release right hand under regional anesthesia  Daryll Brod 12/21/2020, 6:14 AM

## 2020-12-21 NOTE — Op Note (Signed)
NAME: Corey Ward MEDICAL RECORD NO: [id number] DATE OF BIRTH: 12-26-1950 FACILITY: [facility] LOCATION: [facility] PHYSICIAN: Wynonia Sours, MD   OPERATIVE REPORT   DATE OF PROCEDURE: 12/21/20    PREOPERATIVE DIAGNOSIS: Carpal tunnel syndrome right hand   POSTOPERATIVE DIAGNOSIS: Same   PROCEDURE: Decompression median nerve right hand   SURGEON: Daryll Brod, M.D.   ASSISTANT: none   ANESTHESIA:  General with regional and Local   INTRAVENOUS FLUIDS:  Per anesthesia flow sheet.   ESTIMATED BLOOD LOSS:  Minimal.   COMPLICATIONS:  None.   SPECIMENS:  none   TOURNIQUET TIME:    Total Tourniquet Time Documented: Upper Arm (Right) - 28 minutes Total: Upper Arm (Right) - 28 minutes    DISPOSITION:  Stable to PACU.   INDICATIONS: Patient is a 70 year old male with a history of numbness and tingling bilateral hands more significant right than left he is elected undergo surgical decompression of the right median nerve.  Nerve conductions are positive.  This is not responded to conservative treatment.  Preperi-and postoperative course been discussed along with risks and complications.  He is aware that there is no guarantee to the surgery the possibility of infection recurrence injury to arteries nerves tendons incomplete relief symptoms and dystrophy.  In the preoperative area the patient is seen the extremity marked by both patient and surgeon antibiotic given a supraclavicular block was carried out without difficulty under the direction anesthesia department.  OPERATIVE COURSE: Patient is brought the operating room placed in the supine position with the right arm free.  Prep was done with ChloraPrep.  A 3-minute dry time was allowed and timeout taken confirm patient procedure.  The patient continued to have feeling in the area of the incision.  A local infiltration with quarter percent bupivacaine without epinephrine was given approximately 8 cc was used.  Time was  allowed for this to take effect.  In attempting to make an incision he continued to have pain and a general anesthetic was carried out without difficulty.  The incision was made longitudinally in the palm carried down through subcutaneous tissue.  Bleeders were electrocauterized.  Palmar fascia was split.  The superficial palmar arch was identified along with flexor tendon to the ring and small fingers.  A sec see  retractor was then placed retracting the median nerve and Lexer tendons radially ulnar nerve ulnarly.  Flexor retinaculum was then released on its ulnar border a stool retractor along with a sect See retractor was placed.  Deep structures were dissected free with blunt dissection.  The proximal flexor retinaculum distal forearm fascia was then released for approximately 2 cm proximal to the wrist crease under direct vision.  The canal was explored.  Area compression to the nerve was apparent.  Motor branch entered into muscle distally.  The wound was copious irrigated with saline.  The skin was closed interrupted 4-0 nylon sutures.  Sterile compressive dressing with the fingers 3 was applied.  Deflation of the tourniquet all fingers immediately pink.  He was taken to the recovery room for observation in satisfactory condition.  He will be discharged home to return to the hand center [facility] in 1 week on Tylenol ibuprofen for pain with Ultram for breakthrough.   Daryll Brod, MD Electronically signed, 12/21/20

## 2020-12-21 NOTE — Anesthesia Postprocedure Evaluation (Signed)
Anesthesia Post Note  Patient: Corey Ward  Procedure(s) Performed: RIGHT CARPAL TUNNEL RELEASE (Right: Wrist)     Patient location during evaluation: PACU Anesthesia Type: General Level of consciousness: awake Pain management: pain level controlled Vital Signs Assessment: post-procedure vital signs reviewed and stable Respiratory status: spontaneous breathing, nonlabored ventilation, respiratory function stable and patient connected to nasal cannula oxygen Cardiovascular status: blood pressure returned to baseline and stable Postop Assessment: no apparent nausea or vomiting Anesthetic complications: no   No notable events documented.  Last Vitals:  Vitals:   12/21/20 1430 12/21/20 1502  BP: 115/64 (!) 143/77  Pulse: 62 (!) 55  Resp: 15 16  Temp:  36.5 C  SpO2: 95% 95%    Last Pain:  Vitals:   12/21/20 1502  TempSrc:   PainSc: 0-No pain                 Corey Ward P Neythan Kozlov

## 2020-12-21 NOTE — Progress Notes (Signed)
Assisted Dr. Ellender with right, ultrasound guided, supraclavicular block. Side rails up, monitors on throughout procedure. See vital signs in flow sheet. Tolerated Procedure well. 

## 2020-12-21 NOTE — Anesthesia Preprocedure Evaluation (Signed)
Anesthesia Evaluation  Patient identified by MRN, date of birth, ID band Patient awake    Reviewed: Allergy & Precautions, NPO status , Patient's Chart, lab work & pertinent test results  History of Anesthesia Complications (+) PONV and history of anesthetic complications  Airway Mallampati: III  TM Distance: >3 FB Neck ROM: Full    Dental no notable dental hx.    Pulmonary asthma , sleep apnea and Continuous Positive Airway Pressure Ventilation ,    Pulmonary exam normal breath sounds clear to auscultation       Cardiovascular negative cardio ROS Normal cardiovascular exam Rhythm:Regular Rate:Normal     Neuro/Psych  Neuromuscular disease negative psych ROS   GI/Hepatic negative GI ROS, Neg liver ROS,   Endo/Other  diabetes, Oral Hypoglycemic Agents  Renal/GU negative Renal ROS     Musculoskeletal  (+) Arthritis , Gout   Abdominal (+) + obese,   Peds  Hematology HLD   Anesthesia Other Findings RIGHT CARPAL TUNNEL SYNDROME  Reproductive/Obstetrics                             Anesthesia Physical Anesthesia Plan  ASA: 3  Anesthesia Plan: Regional   Post-op Pain Management:    Induction: Intravenous  PONV Risk Score and Plan: 2 and Ondansetron, Dexamethasone, Propofol infusion, Midazolam and Treatment may vary due to age or medical condition  Airway Management Planned: Simple Face Mask  Additional Equipment:   Intra-op Plan:   Post-operative Plan:   Informed Consent: I have reviewed the patients History and Physical, chart, labs and discussed the procedure including the risks, benefits and alternatives for the proposed anesthesia with the patient or authorized representative who has indicated his/her understanding and acceptance.     Dental advisory given  Plan Discussed with: CRNA and Surgeon  Anesthesia Plan Comments:         Anesthesia Quick Evaluation

## 2020-12-21 NOTE — Anesthesia Procedure Notes (Signed)
Anesthesia Regional Block: Supraclavicular block   Pre-Anesthetic Checklist: , timeout performed,  Correct Patient, Correct Site, Correct Laterality,  Correct Procedure, Correct Position, site marked,  Risks and benefits discussed,  Surgical consent,  Pre-op evaluation,  At surgeon's request and post-op pain management  Laterality: Right  Prep: chloraprep       Needles:  Injection technique: Single-shot  Needle Type: Echogenic Stimulator Needle     Needle Length: 9cm  Needle Gauge: 21     Additional Needles:   Procedures:,,,, ultrasound used (permanent image in chart),,    Narrative:  Start time: 12/21/2020 1:05 PM End time: 12/21/2020 1:15 PM Injection made incrementally with aspirations every 5 mL.  Performed by: Personally  Anesthesiologist: Murvin Natal, MD  Additional Notes: Functioning IV was confirmed and monitors were applied.  A timeout was performed. Sterile prep, hand hygiene and sterile gloves were used. A 43mm 21ga Arrow echogenic stimulator needle was used. Negative aspiration and negative test dose prior to incremental administration of local anesthetic. The patient tolerated the procedure well.  Ultrasound guidance: relevent anatomy identified, needle position confirmed, local anesthetic spread visualized around nerve(s), vascular puncture avoided.  Image printed for medical record.

## 2020-12-21 NOTE — Brief Op Note (Signed)
12/21/2020  2:21 PM  PATIENT:  Corey Ward  70 y.o. male  PRE-OPERATIVE DIAGNOSIS:  RIGHT CARPAL TUNNEL SYNDROME  POST-OPERATIVE DIAGNOSIS:  RIGHT CARPAL TUNNEL SYNDROME  PROCEDURE:  Procedure(s): RIGHT CARPAL TUNNEL RELEASE (Right)  SURGEON:  Surgeon(s) and Role:    * Daryll Brod, MD - Primary  PHYSICIAN ASSISTANT:   ASSISTANTS: none   ANESTHESIA:   local, regional, and general  EBL:  1 mL   BLOOD ADMINISTERED:none  DRAINS: none   LOCAL MEDICATIONS USED:  BUPIVICAINE   SPECIMEN:  No Specimen  DISPOSITION OF SPECIMEN:  N/A  COUNTS:  YES  TOURNIQUET:   Total Tourniquet Time Documented: Upper Arm (Right) - 28 minutes Total: Upper Arm (Right) - 28 minutes   DICTATION: .Viviann Spare Dictation  PLAN OF CARE: Discharge to home after PACU  PATIENT DISPOSITION:  PACU - hemodynamically stable.

## 2020-12-21 NOTE — Discharge Instructions (Addendum)

## 2020-12-21 NOTE — Anesthesia Procedure Notes (Signed)
Procedure Name: LMA Insertion Date/Time: 12/21/2020 1:49 PM Performed by: Murvin Natal, MD Pre-anesthesia Checklist: Patient identified, Emergency Drugs available, Suction available and Patient being monitored Patient Re-evaluated:Patient Re-evaluated prior to induction Oxygen Delivery Method: Circle System Utilized Preoxygenation: Pre-oxygenation with 100% oxygen Induction Type: IV induction Ventilation: Mask ventilation without difficulty LMA: LMA inserted LMA Size: 4.0 Number of attempts: 1 Airway Equipment and Method: Bite block Placement Confirmation: positive ETCO2 Tube secured with: Tape Dental Injury: Teeth and Oropharynx as per pre-operative assessment

## 2020-12-24 ENCOUNTER — Encounter (HOSPITAL_BASED_OUTPATIENT_CLINIC_OR_DEPARTMENT_OTHER): Payer: Self-pay | Admitting: Orthopedic Surgery

## 2021-01-16 DIAGNOSIS — Z125 Encounter for screening for malignant neoplasm of prostate: Secondary | ICD-10-CM | POA: Diagnosis not present

## 2021-01-16 DIAGNOSIS — E291 Testicular hypofunction: Secondary | ICD-10-CM | POA: Diagnosis not present

## 2021-01-16 DIAGNOSIS — M109 Gout, unspecified: Secondary | ICD-10-CM | POA: Diagnosis not present

## 2021-01-16 DIAGNOSIS — E1169 Type 2 diabetes mellitus with other specified complication: Secondary | ICD-10-CM | POA: Diagnosis not present

## 2021-01-16 DIAGNOSIS — E785 Hyperlipidemia, unspecified: Secondary | ICD-10-CM | POA: Diagnosis not present

## 2021-01-16 DIAGNOSIS — M1990 Unspecified osteoarthritis, unspecified site: Secondary | ICD-10-CM | POA: Diagnosis not present

## 2021-01-23 DIAGNOSIS — Z0000 Encounter for general adult medical examination without abnormal findings: Secondary | ICD-10-CM | POA: Diagnosis not present

## 2021-01-23 DIAGNOSIS — Z1331 Encounter for screening for depression: Secondary | ICD-10-CM | POA: Diagnosis not present

## 2021-01-23 DIAGNOSIS — E785 Hyperlipidemia, unspecified: Secondary | ICD-10-CM | POA: Diagnosis not present

## 2021-01-23 DIAGNOSIS — E669 Obesity, unspecified: Secondary | ICD-10-CM | POA: Diagnosis not present

## 2021-01-23 DIAGNOSIS — R82998 Other abnormal findings in urine: Secondary | ICD-10-CM | POA: Diagnosis not present

## 2021-01-23 DIAGNOSIS — R911 Solitary pulmonary nodule: Secondary | ICD-10-CM | POA: Diagnosis not present

## 2021-01-23 DIAGNOSIS — N401 Enlarged prostate with lower urinary tract symptoms: Secondary | ICD-10-CM | POA: Diagnosis not present

## 2021-01-23 DIAGNOSIS — E1169 Type 2 diabetes mellitus with other specified complication: Secondary | ICD-10-CM | POA: Diagnosis not present

## 2021-01-23 DIAGNOSIS — Z79899 Other long term (current) drug therapy: Secondary | ICD-10-CM | POA: Diagnosis not present

## 2021-01-23 DIAGNOSIS — E119 Type 2 diabetes mellitus without complications: Secondary | ICD-10-CM | POA: Diagnosis not present

## 2021-01-23 DIAGNOSIS — I2510 Atherosclerotic heart disease of native coronary artery without angina pectoris: Secondary | ICD-10-CM | POA: Diagnosis not present

## 2021-01-23 DIAGNOSIS — H9193 Unspecified hearing loss, bilateral: Secondary | ICD-10-CM | POA: Diagnosis not present

## 2021-01-25 ENCOUNTER — Other Ambulatory Visit: Payer: Self-pay | Admitting: Internal Medicine

## 2021-01-25 DIAGNOSIS — R911 Solitary pulmonary nodule: Secondary | ICD-10-CM

## 2021-04-02 DIAGNOSIS — N5201 Erectile dysfunction due to arterial insufficiency: Secondary | ICD-10-CM | POA: Diagnosis not present

## 2021-04-02 DIAGNOSIS — R351 Nocturia: Secondary | ICD-10-CM | POA: Diagnosis not present

## 2021-04-02 DIAGNOSIS — N401 Enlarged prostate with lower urinary tract symptoms: Secondary | ICD-10-CM | POA: Diagnosis not present

## 2021-05-22 DIAGNOSIS — R338 Other retention of urine: Secondary | ICD-10-CM | POA: Diagnosis not present

## 2021-06-03 DIAGNOSIS — N401 Enlarged prostate with lower urinary tract symptoms: Secondary | ICD-10-CM | POA: Diagnosis not present

## 2021-06-03 DIAGNOSIS — R338 Other retention of urine: Secondary | ICD-10-CM | POA: Diagnosis not present

## 2021-06-12 DIAGNOSIS — R8271 Bacteriuria: Secondary | ICD-10-CM | POA: Diagnosis not present

## 2021-06-12 DIAGNOSIS — N453 Epididymo-orchitis: Secondary | ICD-10-CM | POA: Diagnosis not present

## 2021-07-15 DIAGNOSIS — E785 Hyperlipidemia, unspecified: Secondary | ICD-10-CM | POA: Diagnosis not present

## 2021-07-15 DIAGNOSIS — E1169 Type 2 diabetes mellitus with other specified complication: Secondary | ICD-10-CM | POA: Diagnosis not present

## 2021-07-26 DIAGNOSIS — M1990 Unspecified osteoarthritis, unspecified site: Secondary | ICD-10-CM | POA: Diagnosis not present

## 2021-07-26 DIAGNOSIS — I2510 Atherosclerotic heart disease of native coronary artery without angina pectoris: Secondary | ICD-10-CM | POA: Diagnosis not present

## 2021-07-26 DIAGNOSIS — G4730 Sleep apnea, unspecified: Secondary | ICD-10-CM | POA: Diagnosis not present

## 2021-07-26 DIAGNOSIS — E785 Hyperlipidemia, unspecified: Secondary | ICD-10-CM | POA: Diagnosis not present

## 2021-07-26 DIAGNOSIS — E1169 Type 2 diabetes mellitus with other specified complication: Secondary | ICD-10-CM | POA: Diagnosis not present

## 2021-07-26 DIAGNOSIS — R911 Solitary pulmonary nodule: Secondary | ICD-10-CM | POA: Diagnosis not present

## 2021-07-26 DIAGNOSIS — N401 Enlarged prostate with lower urinary tract symptoms: Secondary | ICD-10-CM | POA: Diagnosis not present

## 2021-07-26 DIAGNOSIS — E790 Hyperuricemia without signs of inflammatory arthritis and tophaceous disease: Secondary | ICD-10-CM | POA: Diagnosis not present

## 2021-07-26 DIAGNOSIS — E291 Testicular hypofunction: Secondary | ICD-10-CM | POA: Diagnosis not present

## 2021-08-20 ENCOUNTER — Other Ambulatory Visit: Payer: Medicare PPO

## 2021-09-04 ENCOUNTER — Ambulatory Visit
Admission: RE | Admit: 2021-09-04 | Discharge: 2021-09-04 | Disposition: A | Discharge status: Home / Self Care | Payer: Medicare PPO | Source: Ambulatory Visit | Attending: Internal Medicine | Admitting: Internal Medicine

## 2021-09-04 DIAGNOSIS — R911 Solitary pulmonary nodule: Secondary | ICD-10-CM

## 2021-09-06 DIAGNOSIS — R339 Retention of urine, unspecified: Secondary | ICD-10-CM | POA: Diagnosis not present

## 2021-09-06 DIAGNOSIS — N401 Enlarged prostate with lower urinary tract symptoms: Secondary | ICD-10-CM | POA: Diagnosis not present

## 2021-12-16 DIAGNOSIS — M79672 Pain in left foot: Secondary | ICD-10-CM | POA: Diagnosis not present

## 2021-12-28 DIAGNOSIS — Z23 Encounter for immunization: Secondary | ICD-10-CM | POA: Diagnosis not present

## 2022-01-06 DIAGNOSIS — M79672 Pain in left foot: Secondary | ICD-10-CM | POA: Diagnosis not present

## 2022-02-11 DIAGNOSIS — M109 Gout, unspecified: Secondary | ICD-10-CM | POA: Diagnosis not present

## 2022-02-11 DIAGNOSIS — E291 Testicular hypofunction: Secondary | ICD-10-CM | POA: Diagnosis not present

## 2022-02-11 DIAGNOSIS — Z125 Encounter for screening for malignant neoplasm of prostate: Secondary | ICD-10-CM | POA: Diagnosis not present

## 2022-02-11 DIAGNOSIS — R7989 Other specified abnormal findings of blood chemistry: Secondary | ICD-10-CM | POA: Diagnosis not present

## 2022-02-11 DIAGNOSIS — E785 Hyperlipidemia, unspecified: Secondary | ICD-10-CM | POA: Diagnosis not present

## 2022-02-11 DIAGNOSIS — Z1211 Encounter for screening for malignant neoplasm of colon: Secondary | ICD-10-CM | POA: Diagnosis not present

## 2022-02-11 DIAGNOSIS — E1169 Type 2 diabetes mellitus with other specified complication: Secondary | ICD-10-CM | POA: Diagnosis not present

## 2022-02-11 DIAGNOSIS — E538 Deficiency of other specified B group vitamins: Secondary | ICD-10-CM | POA: Diagnosis not present

## 2022-02-18 DIAGNOSIS — E785 Hyperlipidemia, unspecified: Secondary | ICD-10-CM | POA: Diagnosis not present

## 2022-02-18 DIAGNOSIS — E669 Obesity, unspecified: Secondary | ICD-10-CM | POA: Diagnosis not present

## 2022-02-18 DIAGNOSIS — R82998 Other abnormal findings in urine: Secondary | ICD-10-CM | POA: Diagnosis not present

## 2022-02-18 DIAGNOSIS — M1990 Unspecified osteoarthritis, unspecified site: Secondary | ICD-10-CM | POA: Diagnosis not present

## 2022-02-18 DIAGNOSIS — R911 Solitary pulmonary nodule: Secondary | ICD-10-CM | POA: Diagnosis not present

## 2022-02-18 DIAGNOSIS — M791 Myalgia, unspecified site: Secondary | ICD-10-CM | POA: Diagnosis not present

## 2022-02-18 DIAGNOSIS — E1169 Type 2 diabetes mellitus with other specified complication: Secondary | ICD-10-CM | POA: Diagnosis not present

## 2022-02-18 DIAGNOSIS — Z0000 Encounter for general adult medical examination without abnormal findings: Secondary | ICD-10-CM | POA: Diagnosis not present

## 2022-02-18 DIAGNOSIS — M109 Gout, unspecified: Secondary | ICD-10-CM | POA: Diagnosis not present

## 2022-02-18 DIAGNOSIS — I2510 Atherosclerotic heart disease of native coronary artery without angina pectoris: Secondary | ICD-10-CM | POA: Diagnosis not present

## 2022-02-21 ENCOUNTER — Other Ambulatory Visit: Payer: Self-pay | Admitting: Internal Medicine

## 2022-02-21 DIAGNOSIS — R911 Solitary pulmonary nodule: Secondary | ICD-10-CM

## 2022-03-08 DIAGNOSIS — H43393 Other vitreous opacities, bilateral: Secondary | ICD-10-CM | POA: Diagnosis not present

## 2022-03-08 DIAGNOSIS — E119 Type 2 diabetes mellitus without complications: Secondary | ICD-10-CM | POA: Diagnosis not present

## 2022-03-29 IMAGING — CT CT CHEST W/O CM
2 of 4 series · 11 of 36 positions shown, 13 images · non-contrast
Comparison: CT calcium score 01/31/2020

CLINICAL DATA: Follow-up pulmonary nodules.

EXAM:
CT CHEST WITHOUT CONTRAST
TECHNIQUE: Multidetector CT imaging of the chest was performed following the
standard protocol without IV contrast.

[Series 2: chest 2.00 br40 s3 · axial · 0.65mm/px · z∈[+1404,+1720]mm · 8 of 188 slices shown, 10 images (1 of 2)]
[im 15/188  mediastinal]
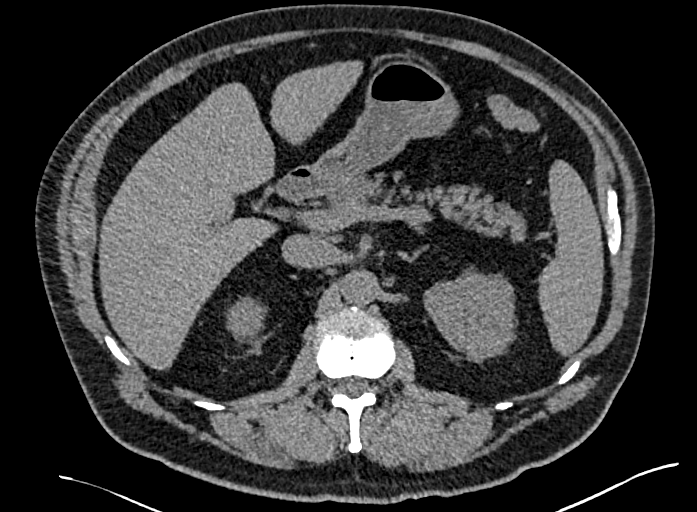
[im 15/188  lung]
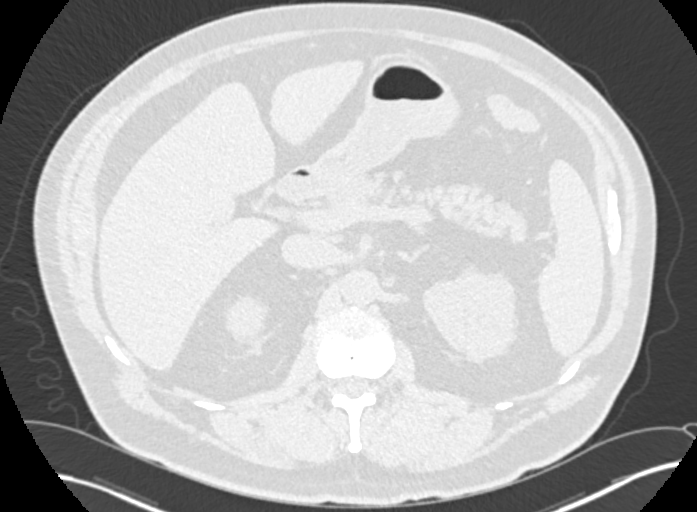
[im 44/188  lung]
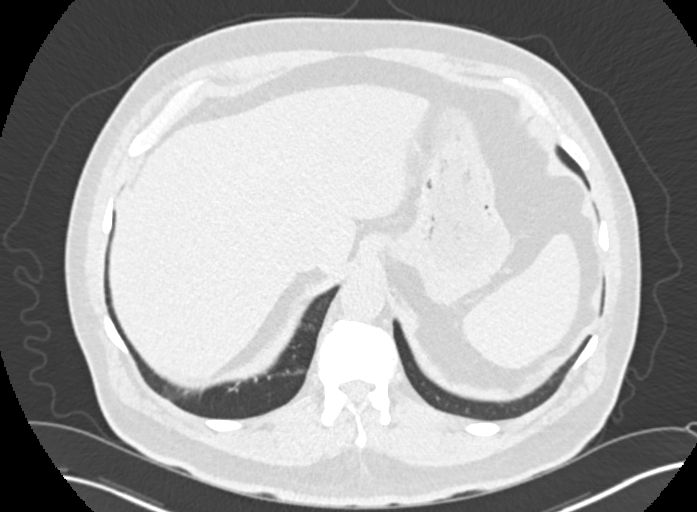
[im 58/188  lung]
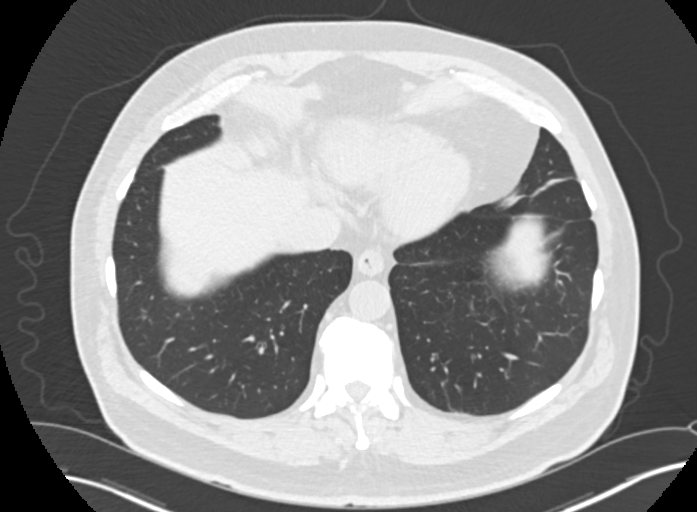
[im 87/188  lung]
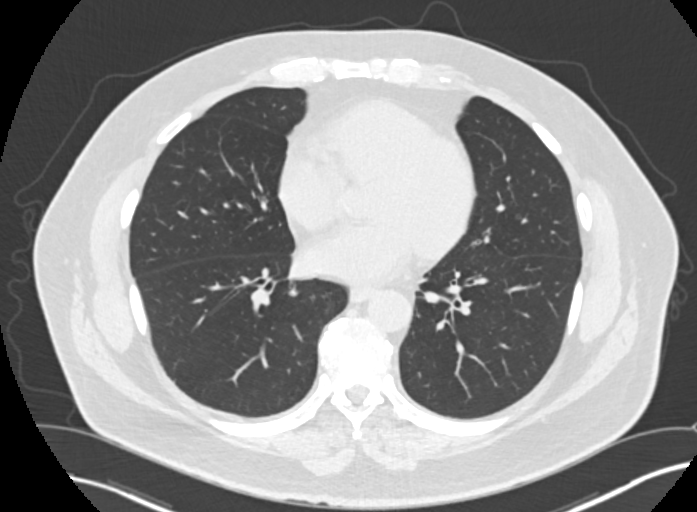
[im 101/188  mediastinal]
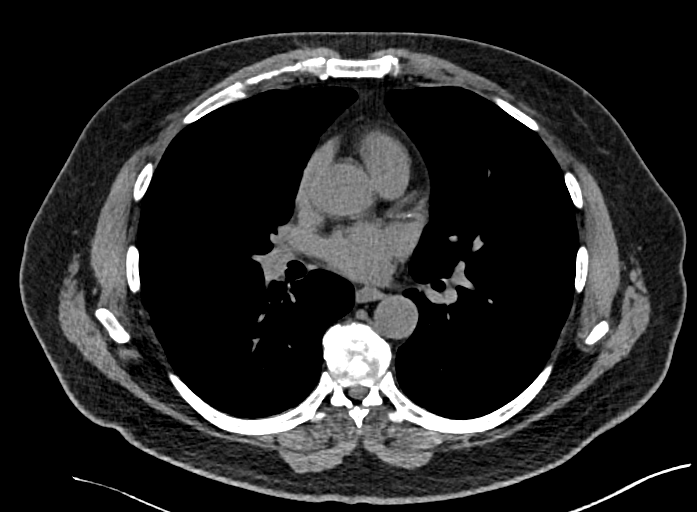
[im 101/188  lung]
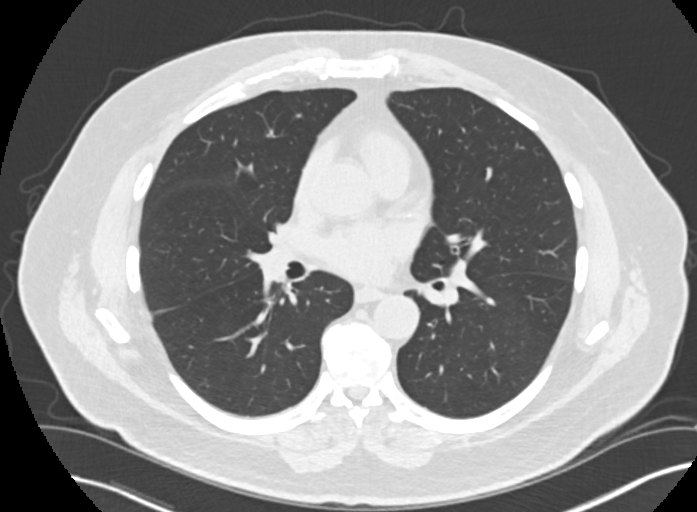
[im 130/188  lung]
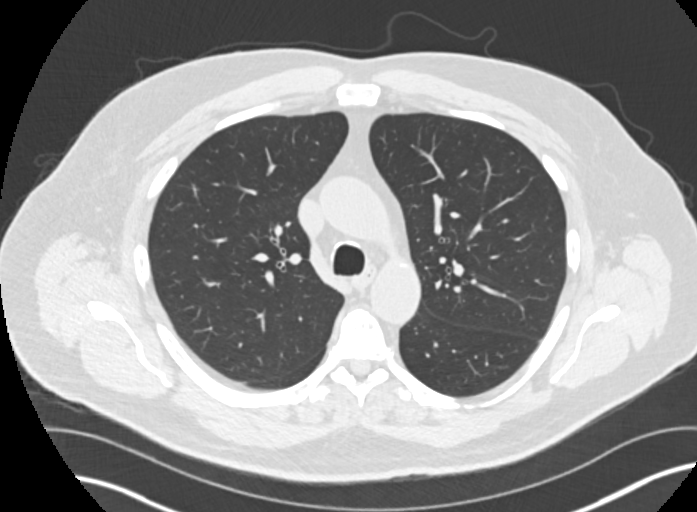
[im 144/188  lung]
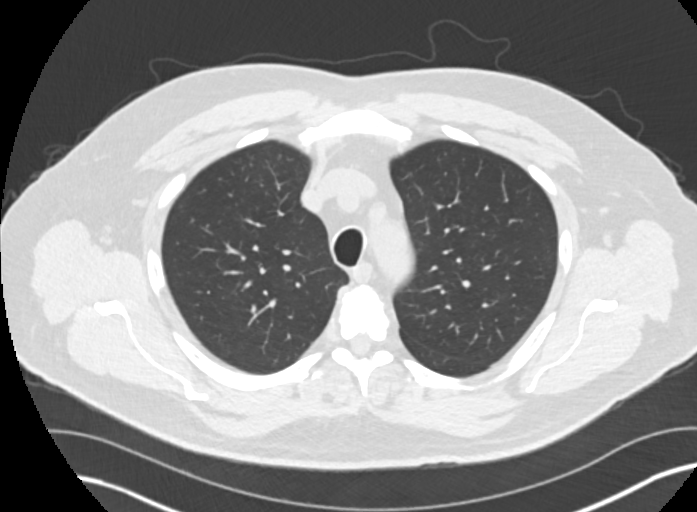
[im 173/188  lung]
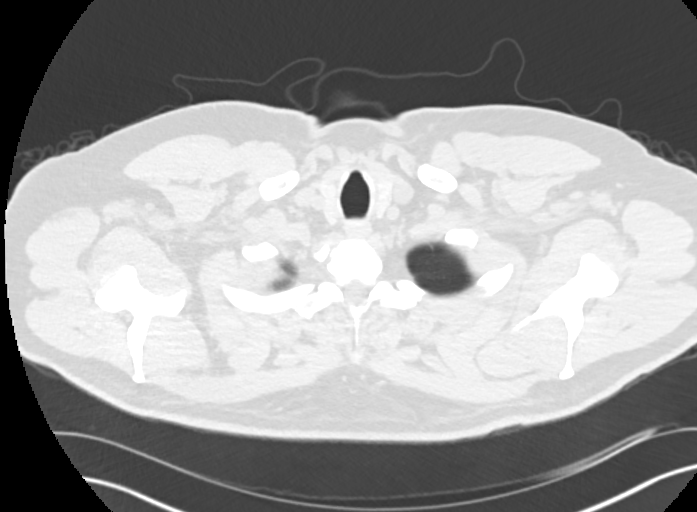

[Series 4: chest 2.00 br40 s3 · coronal · 0.74mm/px · 3 of 167 slices shown (2 of 2)]
[im 34/167  lung]
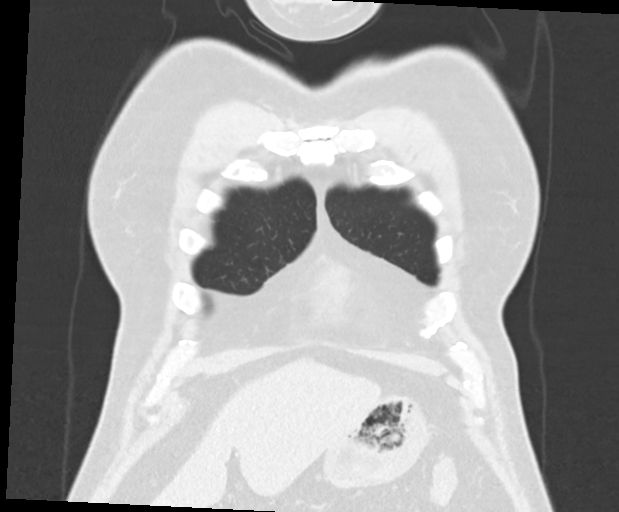
[im 67/167  lung]
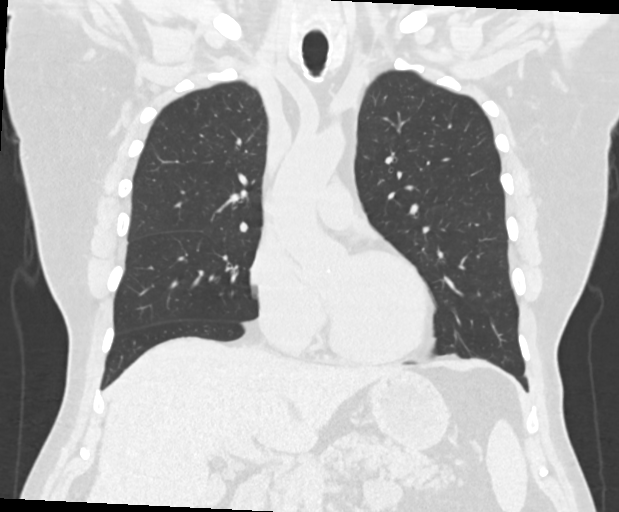
[im 100/167  lung]
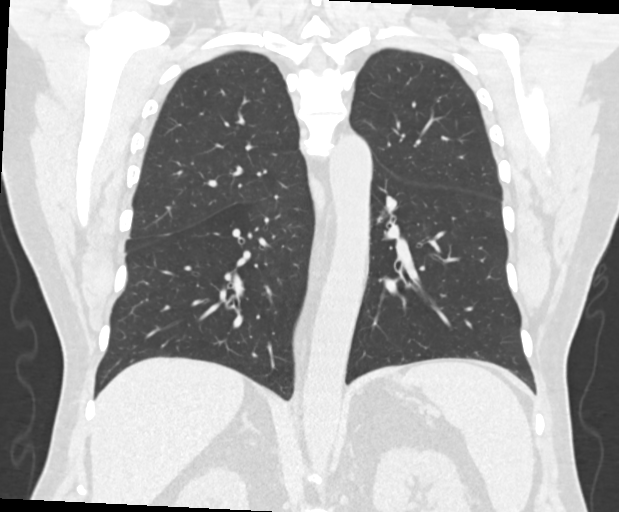

[11 of 36 positions shown; findings below may reference images not displayed]

FINDINGS: Cardiovascular: Heart size is normal. No significant pericardial
fluid.

Mediastinum/Nodes: No evidence for chest lymphadenopathy. Visualized
thyroid tissue is unremarkable.

Lungs/Pleura: Small amount of material in the anterior trachea is
suggestive for adherent mucus. Mainstem bronchi and trachea are
patent. No pleural effusions. Stable focal densities along the
medial aspect of the right middle lobe on sequence 8, image 117 and
suspect this is associated with scarring. Few linear densities in
the dependent aspect of the left lower lobe are suggestive for
atelectasis. Persistent sub solid opacity in the left upper lobe on
sequence 8 image 70. This area measures 6-7 mm and minimally
changed. The solid component measures roughly 3-4 mm. No large areas
of airspace disease or lung consolidation. Stable tiny nodular
density along the right minor fissure on sequence 8, image 83.

Upper Abdomen: Normal appearance of the upper abdominal structures.

Musculoskeletal: No acute bone abnormality.
IMPRESSION: 1. Stable sub solid opacity in the left upper lung measuring 6-7 mm.
The solid component is approximately 4 mm. Recommend annual
follow-up until there is 5 years of stability. This recommendation
follows the consensus statement: Guidelines for Management of
Incidental Pulmonary Nodules Detected on CT Images: From the
2. No acute chest abnormality.

## 2022-04-09 DIAGNOSIS — Z23 Encounter for immunization: Secondary | ICD-10-CM | POA: Diagnosis not present

## 2022-04-16 DIAGNOSIS — E669 Obesity, unspecified: Secondary | ICD-10-CM | POA: Diagnosis not present

## 2022-04-16 DIAGNOSIS — M064 Inflammatory polyarthropathy: Secondary | ICD-10-CM | POA: Diagnosis not present

## 2022-04-16 DIAGNOSIS — M1991 Primary osteoarthritis, unspecified site: Secondary | ICD-10-CM | POA: Diagnosis not present

## 2022-04-16 DIAGNOSIS — Z6835 Body mass index (BMI) 35.0-35.9, adult: Secondary | ICD-10-CM | POA: Diagnosis not present

## 2022-04-16 DIAGNOSIS — M25512 Pain in left shoulder: Secondary | ICD-10-CM | POA: Diagnosis not present

## 2022-04-16 DIAGNOSIS — M25511 Pain in right shoulder: Secondary | ICD-10-CM | POA: Diagnosis not present

## 2022-05-07 DIAGNOSIS — E669 Obesity, unspecified: Secondary | ICD-10-CM | POA: Diagnosis not present

## 2022-05-07 DIAGNOSIS — M1991 Primary osteoarthritis, unspecified site: Secondary | ICD-10-CM | POA: Diagnosis not present

## 2022-05-07 DIAGNOSIS — M25512 Pain in left shoulder: Secondary | ICD-10-CM | POA: Diagnosis not present

## 2022-05-07 DIAGNOSIS — M1A09X0 Idiopathic chronic gout, multiple sites, without tophus (tophi): Secondary | ICD-10-CM | POA: Diagnosis not present

## 2022-05-07 DIAGNOSIS — Z6835 Body mass index (BMI) 35.0-35.9, adult: Secondary | ICD-10-CM | POA: Diagnosis not present

## 2022-05-07 DIAGNOSIS — M064 Inflammatory polyarthropathy: Secondary | ICD-10-CM | POA: Diagnosis not present

## 2022-05-07 DIAGNOSIS — M25511 Pain in right shoulder: Secondary | ICD-10-CM | POA: Diagnosis not present

## 2022-05-13 DIAGNOSIS — I2510 Atherosclerotic heart disease of native coronary artery without angina pectoris: Secondary | ICD-10-CM | POA: Diagnosis not present

## 2022-05-13 DIAGNOSIS — E1169 Type 2 diabetes mellitus with other specified complication: Secondary | ICD-10-CM | POA: Diagnosis not present

## 2022-05-23 ENCOUNTER — Emergency Department (HOSPITAL_COMMUNITY)
Admission: EM | Admit: 2022-05-23 | Discharge: 2022-05-23 | Disposition: A | Discharge status: Home / Self Care | Payer: Medicare PPO | Attending: Emergency Medicine | Admitting: Emergency Medicine

## 2022-05-23 ENCOUNTER — Other Ambulatory Visit: Payer: Self-pay

## 2022-05-23 DIAGNOSIS — E119 Type 2 diabetes mellitus without complications: Secondary | ICD-10-CM | POA: Insufficient documentation

## 2022-05-23 DIAGNOSIS — R9431 Abnormal electrocardiogram [ECG] [EKG]: Secondary | ICD-10-CM | POA: Diagnosis not present

## 2022-05-23 DIAGNOSIS — K047 Periapical abscess without sinus: Secondary | ICD-10-CM | POA: Insufficient documentation

## 2022-05-23 DIAGNOSIS — M545 Low back pain, unspecified: Secondary | ICD-10-CM | POA: Diagnosis not present

## 2022-05-23 DIAGNOSIS — K029 Dental caries, unspecified: Secondary | ICD-10-CM | POA: Diagnosis not present

## 2022-05-23 DIAGNOSIS — Z7984 Long term (current) use of oral hypoglycemic drugs: Secondary | ICD-10-CM | POA: Insufficient documentation

## 2022-05-23 LAB — URINALYSIS, ROUTINE W REFLEX MICROSCOPIC
Bilirubin Urine: NEGATIVE
Glucose, UA: NEGATIVE mg/dL
Ketones, ur: NEGATIVE mg/dL
Leukocytes,Ua: NEGATIVE
Nitrite: NEGATIVE
Protein, ur: NEGATIVE mg/dL
Specific Gravity, Urine: 1.013 (ref 1.005–1.030)
pH: 5 (ref 5.0–8.0)

## 2022-05-23 LAB — CBC WITH DIFFERENTIAL/PLATELET
Abs Immature Granulocytes: 0.01 10*3/uL (ref 0.00–0.07)
Basophils Absolute: 0 10*3/uL (ref 0.0–0.1)
Basophils Relative: 1 %
Eosinophils Absolute: 0.3 10*3/uL (ref 0.0–0.5)
Eosinophils Relative: 4 %
HCT: 36.9 % — ABNORMAL LOW (ref 39.0–52.0)
Hemoglobin: 12.1 g/dL — ABNORMAL LOW (ref 13.0–17.0)
Immature Granulocytes: 0 %
Lymphocytes Relative: 23 %
Lymphs Abs: 1.6 10*3/uL (ref 0.7–4.0)
MCH: 30 pg (ref 26.0–34.0)
MCHC: 32.8 g/dL (ref 30.0–36.0)
MCV: 91.3 fL (ref 80.0–100.0)
Monocytes Absolute: 0.5 10*3/uL (ref 0.1–1.0)
Monocytes Relative: 7 %
Neutro Abs: 4.7 10*3/uL (ref 1.7–7.7)
Neutrophils Relative %: 65 %
Platelets: 225 10*3/uL (ref 150–400)
RBC: 4.04 MIL/uL — ABNORMAL LOW (ref 4.22–5.81)
RDW: 14.4 % (ref 11.5–15.5)
WBC: 7.1 10*3/uL (ref 4.0–10.5)
nRBC: 0 % (ref 0.0–0.2)

## 2022-05-23 LAB — BASIC METABOLIC PANEL
Anion gap: 9 (ref 5–15)
BUN: 15 mg/dL (ref 8–23)
CO2: 27 mmol/L (ref 22–32)
Calcium: 9.1 mg/dL (ref 8.9–10.3)
Chloride: 102 mmol/L (ref 98–111)
Creatinine, Ser: 0.79 mg/dL (ref 0.61–1.24)
GFR, Estimated: >60 mL/min (ref >60)
Glucose, Bld: 128 mg/dL — ABNORMAL HIGH (ref 70–99)
Potassium: 3.8 mmol/L (ref 3.5–5.1)
Sodium: 138 mmol/L (ref 135–145)

## 2022-05-23 NOTE — ED Provider Notes (Signed)
[facility] AT [facility] Provider Note   CSN: [id number] Arrival date & time: 05/23/22  1216     History  Chief Complaint  Patient presents with   dental infection   Back Pain    Corey Ward is a 72 y.o. male.  Patient presents to the emergency department today for evaluation of left lower jaw pain.  Patient had a dental procedure done about 3 weeks ago.  He was on antibiotics prior to this and then 1 week after the procedure.  He states that he had a dry socket.  He completed a course of amoxicillin.  Symptoms were then improving however over the past few days he has had increasing pain without swelling.  He states that last evening he noted pus come out of an area behind his left posterior most molar.  He also developed pain in his right lower back a couple of days ago that is worse with movement and certain positions.  He has had some night sweats but no documented fevers at home.  No vomiting.  He has diabetes but denies other types of immunocompromise.  He was encouraged to come to the emergency department by PCP.  States that he has follow-up with his oral surgeon next week and has been prescribed a course of clindamycin which she has not yet started taking.  He denies lower extremity numbness, tingling.  Denies chest pain, shortness of breath, anterior abdominal pain.       Home Medications Prior to Admission medications   Medication Sig Start Date End Date Taking? Authorizing Provider  allopurinol (ZYLOPRIM) 300 MG tablet Take 300 mg by mouth at bedtime.     [provider]  CANNABIDIOL PO Take 1-2 tablets by mouth at bedtime as needed (sleep). CBD gummies    [provider]  colchicine 0.6 MG tablet Take 0.6 mg by mouth at bedtime.     [provider]  Evolocumab (REPATHA [place]) Inject into the skin.    [provider]  ezetimibe-simvastatin (VYTORIN) 10-40 MG per tablet Take 1 tablet by mouth at bedtime.     [provider]  finasteride (PROSCAR) 5 MG tablet Take 5 mg by mouth every evening.     [provider]  meloxicam (MOBIC) 15 MG tablet Take 15 mg by mouth daily.    [provider]  metFORMIN (GLUCOPHAGE) 500 MG tablet Take 500 mg by mouth 2 (two) times daily with a meal.    [provider]  tamsulosin (FLOMAX) 0.4 MG CAPS Take 0.4 mg by mouth every evening.  06/04/12   [provider]  traMADol (ULTRAM) 50 MG tablet Take 1 tablet (50 mg total) by mouth every 6 (six) hours as needed. 12/21/20   Daryll Brod, MD      Allergies    Patient has no known allergies.    Review of Systems   Review of Systems  Physical Exam Updated Vital Signs BP (!) 156/80 (BP Location: Right Arm)   Pulse 60   Temp 98.5 F (36.9 C) (Oral)   Resp 18   Ht '6\' 4"'$  (1.93 m)   Wt 117.9 kg   SpO2 100%   BMI 31.65 kg/m   Physical Exam Vitals and nursing note reviewed.  Constitutional:      Appearance: He is well-developed.  HENT:     Head: Normocephalic and atraumatic.     Jaw: No trismus.     Right Ear: Tympanic membrane, ear canal  and external ear normal.     Left Ear: Tympanic membrane, ear canal and external ear normal.     Nose: Nose normal.     Mouth/Throat:     Dentition: Abnormal dentition. Dental caries present. No dental abscesses.     Pharynx: Uvula midline. No uvula swelling.     Tonsils: No tonsillar abscesses.     Comments: No gross or palpable abscess.  Tooth #19 has been extracted. Eyes:     Conjunctiva/sclera: Conjunctivae normal.     Pupils: Pupils are equal, round, and reactive to light.  Neck:     Comments: No neck swelling or Lugwig's angina Cardiovascular:     Pulses:          Radial pulses are 2+ on the right side and 2+ on the left side.  Abdominal:     Palpations: Abdomen is soft.     Tenderness: There is no abdominal tenderness. There is no right CVA tenderness or left CVA tenderness.  Musculoskeletal:        General: Normal  range of motion.     Cervical back: Normal range of motion and neck supple. No tenderness. Normal range of motion.     Thoracic back: Tenderness present. No spasms. Normal range of motion.     Lumbar back: No tenderness. Normal range of motion.       Back:     Comments: No step-off noted with palpation of spine.   Patient has mild to moderate mid thoracic paraspinous muscular tenderness to palpation.  Skin:    General: Skin is warm and dry.  Neurological:     Mental Status: He is alert.     Sensory: No sensory deficit.     Motor: No abnormal muscle tone.     Comments: 5/5 strength in entire lower extremities bilaterally. No sensation deficit.  Ambulatory to the room without any difficulties.  Psychiatric:        Mood and Affect: Mood normal.     ED Results / Procedures / Treatments   Labs (all labs ordered are listed, but only abnormal results are displayed) Labs Reviewed  CBC WITH DIFFERENTIAL/PLATELET - Abnormal; Notable for the following components:      Result Value   RBC 4.04 (*)    Hemoglobin 12.1 (*)    HCT 36.9 (*)    All other components within normal limits  BASIC METABOLIC PANEL - Abnormal; Notable for the following components:   Glucose, Bld 128 (*)    All other components within normal limits  URINALYSIS, ROUTINE W REFLEX MICROSCOPIC - Abnormal; Notable for the following components:   Hgb urine dipstick MODERATE (*)    Bacteria, UA RARE (*)    All other components within normal limits    EKG None  Radiology No results found.  Procedures Procedures    Medications Ordered in ED Medications - No data to display  ED Course/ Medical Decision Making/ A&P    Patient seen and examined. History obtained directly from patient. Work-up including labs, imaging, EKG ordered in triage, if performed, were reviewed.    Labs/EKG: Pending CBC and BMP, UA  Imaging: None ordered  Medications/Fluids: Offered oral pain medication, patient declines  Most recent  vital signs reviewed and are as follows: BP (!) 156/80 (BP Location: Right Arm)   Pulse 60   Temp 98.5 F (36.9 C) (Oral)   Resp 18   Ht '6\' 4"'$  (1.93 m)   Wt 117.9 kg   SpO2 100%  BMI 31.65 kg/m   Initial impression: Dental pain, possibly recurrent infection.  Also with right-sided back pain, very reproducible and not severe on exam.  He does not have chest pain or any neurodeficits to suggest dissection.  No significant fevers or other immunocompromise to suggest that he has spread of infection to the spine or other areas.  Overall suspect MSK type of pain.                             Medical Decision Making  Patient presents for dental pain.  Likely postoperative infection.  Has already been given a prescription for clindamycin.  They do not have a fever and do not appear septic. Exam unconcerning for Ludwig's angina or other deep tissue infection in neck and I do not feel that advanced imaging is indicated at this time. Low suspicion for PTA, RPA, epiglottis based on exam.   Patient also has back pain that is readily reproducible.  Patient has no neuro or vascular deficits in extremities, strokelike symptoms to suggest dissection or aneurysm.  Low concern for renal colic.  No anterior abdominal pain.  Overall exam is suggestive of MSK pain at this time.  Patient is diabetic however blood sugars are controlled.  No significant immunocompromise which would make me more concerned of infection spread from recent dental procedure.  Patient will be treated for dental infection with antibiotic. Encouraged tylenol/NSAIDs as prescribed or as directed on the packaging for pain. Encouraged follow-up with a dentist for definitive and long-term management.            Final Clinical Impression(s) / ED Diagnoses Final diagnoses:  Dental infection  Acute right-sided low back pain without sciatica    Rx / DC Orders ED Discharge Orders     None         Carlisle Cater, PA-C 05/23/22  1408    Tegeler, Gwenyth Allegra, MD 05/23/22 [phone]

## 2022-05-23 NOTE — ED Triage Notes (Signed)
Pt arrived via POV. Pt recently had dental extraction, which had a dry socket. Pt was prescribed amoxicillin. This AM had purulent discharge from socket, and c/o bilat flank pain.  AOx4

## 2022-05-23 NOTE — Discharge Instructions (Signed)
Your lab workup including urine testing as well as EKG look very reassuring today.  I agree with starting the clindamycin that was prescribed to you for your possible dental infection.  Please start this today as we discussed.  I have low concern that your back pain is related to the infection in the mouth or dental pain, however if you develop persistent shaking chills, high fever, abdominal pain or severe flank pain, you should return to the emergency department for reevaluation.

## 2022-05-23 NOTE — ED Provider Triage Note (Signed)
Emergency Medicine Provider Triage Evaluation Note  Corey Ward , a 72 y.o. male  was evaluated in triage.  Pt complains of flank pain x 1 day.  States he had dental procedure and tooth extracted a few weeks ago, has been on Augmentin and amoxicillin intermittently due to complications with a dry socket.  He started having flank pain yesterday which is worse with any movement.  No dysuria, hematuria, history of kidney stones, chest pain, shortness of breath, urinary tension, fecal incontinence, direct injury to his back.  His doctor advised him to come to the ED for further evaluation..  Review of Systems  Per HPI  Physical Exam  BP (!) 156/80 (BP Location: Right Arm)   Pulse 60   Temp 98.5 F (36.9 C) (Oral)   Resp 18   Ht '6\' 4"'$  (1.93 m)   Wt 117.9 kg   SpO2 100%   BMI 31.65 kg/m  Gen:   Awake, no distress   Resp:  Normal effort  MSK:   Moves extremities without difficulty  Other:  Upper and lower extremity pulses symmetric bilat   Medical Decision Making  Medically screening exam initiated at 12:29 PM.  Appropriate orders placed.  [name] was informed that the remainder of the evaluation will be completed by another provider, this initial triage assessment does not replace that evaluation, and the importance of remaining in the ED until their evaluation is complete.     Sherrill Raring, PA-C 05/23/22 1230

## 2022-05-29 ENCOUNTER — Telehealth: Payer: Self-pay | Admitting: *Deleted

## 2022-05-29 NOTE — Telephone Encounter (Signed)
     Patient  visit on 05/23/2022  at [facility]  was for treatment   Have you been able to follow up with your primary care physician? yes  The patient was  able to obtain any needed medicine or equipment.  Are there diet recommendations that you are having difficulty following?  Patient expresses understanding of discharge instructions and education provided has no other needs at this time.    [place] [phone] [address] , [place] [address] Email : [email or web address]

## 2022-07-14 DIAGNOSIS — L570 Actinic keratosis: Secondary | ICD-10-CM | POA: Diagnosis not present

## 2022-07-14 DIAGNOSIS — L821 Other seborrheic keratosis: Secondary | ICD-10-CM | POA: Diagnosis not present

## 2022-08-06 DIAGNOSIS — M25512 Pain in left shoulder: Secondary | ICD-10-CM | POA: Diagnosis not present

## 2022-08-06 DIAGNOSIS — M1991 Primary osteoarthritis, unspecified site: Secondary | ICD-10-CM | POA: Diagnosis not present

## 2022-08-06 DIAGNOSIS — M064 Inflammatory polyarthropathy: Secondary | ICD-10-CM | POA: Diagnosis not present

## 2022-08-06 DIAGNOSIS — M1A09X0 Idiopathic chronic gout, multiple sites, without tophus (tophi): Secondary | ICD-10-CM | POA: Diagnosis not present

## 2022-08-06 DIAGNOSIS — E669 Obesity, unspecified: Secondary | ICD-10-CM | POA: Diagnosis not present

## 2022-08-06 DIAGNOSIS — Z6832 Body mass index (BMI) 32.0-32.9, adult: Secondary | ICD-10-CM | POA: Diagnosis not present

## 2022-08-06 DIAGNOSIS — M25511 Pain in right shoulder: Secondary | ICD-10-CM | POA: Diagnosis not present

## 2022-08-28 ENCOUNTER — Ambulatory Visit
Admission: RE | Admit: 2022-08-28 | Discharge: 2022-08-28 | Disposition: A | Discharge status: Home / Self Care | Payer: Medicare PPO | Source: Ambulatory Visit | Attending: Internal Medicine | Admitting: Internal Medicine

## 2022-08-28 DIAGNOSIS — R911 Solitary pulmonary nodule: Secondary | ICD-10-CM | POA: Diagnosis not present

## 2022-08-29 DIAGNOSIS — G4730 Sleep apnea, unspecified: Secondary | ICD-10-CM | POA: Diagnosis not present

## 2022-08-29 DIAGNOSIS — E669 Obesity, unspecified: Secondary | ICD-10-CM | POA: Diagnosis not present

## 2022-08-29 DIAGNOSIS — M1990 Unspecified osteoarthritis, unspecified site: Secondary | ICD-10-CM | POA: Diagnosis not present

## 2022-08-29 DIAGNOSIS — E785 Hyperlipidemia, unspecified: Secondary | ICD-10-CM | POA: Diagnosis not present

## 2022-08-29 DIAGNOSIS — I2510 Atherosclerotic heart disease of native coronary artery without angina pectoris: Secondary | ICD-10-CM | POA: Diagnosis not present

## 2022-08-29 DIAGNOSIS — K4090 Unilateral inguinal hernia, without obstruction or gangrene, not specified as recurrent: Secondary | ICD-10-CM | POA: Diagnosis not present

## 2022-08-29 DIAGNOSIS — E1169 Type 2 diabetes mellitus with other specified complication: Secondary | ICD-10-CM | POA: Diagnosis not present

## 2022-08-29 DIAGNOSIS — E790 Hyperuricemia without signs of inflammatory arthritis and tophaceous disease: Secondary | ICD-10-CM | POA: Diagnosis not present

## 2022-09-10 ENCOUNTER — Other Ambulatory Visit (HOSPITAL_BASED_OUTPATIENT_CLINIC_OR_DEPARTMENT_OTHER): Payer: Self-pay

## 2022-09-10 MED ORDER — MOUNJARO 2.5 MG/0.5ML ~~LOC~~ SOAJ: 2.5000 mg | SUBCUTANEOUS | 3 refills | Status: DC

## 2022-09-10 MED ORDER — MOUNJARO 5 MG/0.5ML ~~LOC~~ SOAJ
5.0000 mg | SUBCUTANEOUS | 3 refills | Status: DC
  Filled 2022-09-10: qty 2, 28d supply, fill #0
  Filled 2022-10-02: qty 2, 28d supply, fill #1
  Filled 2022-10-31: qty 2, 28d supply, fill #2

## 2022-09-15 ENCOUNTER — Ambulatory Visit (INDEPENDENT_AMBULATORY_CARE_PROVIDER_SITE_OTHER): Payer: Medicare PPO | Admitting: Neurology

## 2022-09-15 ENCOUNTER — Encounter: Payer: Self-pay | Admitting: Neurology

## 2022-09-15 VITALS — BP 127/74 | HR 63 | Ht 76.0 in | Wt 240.0 lb

## 2022-09-15 DIAGNOSIS — R0683 Snoring: Secondary | ICD-10-CM | POA: Diagnosis not present

## 2022-09-15 DIAGNOSIS — G478 Other sleep disorders: Secondary | ICD-10-CM | POA: Diagnosis not present

## 2022-09-15 DIAGNOSIS — R634 Abnormal weight loss: Secondary | ICD-10-CM

## 2022-09-15 DIAGNOSIS — G4733 Obstructive sleep apnea (adult) (pediatric): Secondary | ICD-10-CM

## 2022-09-15 NOTE — Patient Instructions (Signed)
Screening for Sleep Apnea  Sleep apnea is a condition in which breathing pauses or becomes shallow during sleep. Sleep apnea screening is a test to determine if you are at risk for sleep apnea. The test includes a series of questions. It will only takes a few minutes. Your health care provider may ask you to have this test in preparation for surgery or as part of a physical exam. What are the symptoms of sleep apnea? Common symptoms of sleep apnea include: Snoring. Waking up often at night. Daytime sleepiness. Pauses in breathing. Choking or gasping during sleep. Irritability. Forgetfulness. Trouble thinking clearly. Depression. Personality changes. Most people with sleep apnea do not know that they have it. What are the advantages of sleep apnea screening? Getting screened for sleep apnea can help: Ensure your safety. It is important for your health care providers to know whether or not you have sleep apnea, especially if you are having surgery or have other long-term (chronic) health conditions. Improve your health and allow you to get a better night's rest. Restful sleep can help you: Have more energy. Lose weight. Improve high blood pressure. Improve diabetes management. Prevent stroke. Prevent car accidents. What happens during the screening? Screening usually includes being asked a list of questions about your sleep quality. Some questions you may be asked include: Do you snore? Is your sleep restless? Do you have daytime sleepiness? Has a partner or spouse told you that you stop breathing during sleep? Have you had trouble concentrating or memory loss? What is your age? What is your neck circumference? To measure your neck, keep your back straight and gently wrap the tape measure around your neck. Put the tape measure at the middle of your neck, between your chin and collarbone. What is your sex assigned at birth? Do you have or are you being treated for high blood  pressure? If your screening test is positive, you are at risk for the condition. Further testing may be needed to confirm a diagnosis of sleep apnea. Where to find more information You can find screening tools online or at your health care clinic. For more information about sleep apnea screening and healthy sleep, visit these websites: Centers for Disease Control and Prevention: www.cdc.gov American Sleep Apnea Association: www.sleepapnea.org Contact a health care provider if: You think that you may have sleep apnea. Summary Sleep apnea screening can help determine if you are at risk for sleep apnea. It is important for your health care providers to know whether or not you have sleep apnea, especially if you are having surgery or have other chronic health conditions. You may be asked to take a screening test for sleep apnea in preparation for surgery or as part of a physical exam. This information is not intended to replace advice given to you by your health care provider. Make sure you discuss any questions you have with your health care provider. Document Revised: 02/10/2020 Document Reviewed: 02/10/2020 Elsevier Patient Education  2024 Elsevier Inc.  

## 2022-09-15 NOTE — Progress Notes (Signed)
SLEEP MEDICINE CLINIC    Provider:  Melvyn Novas, MD  Primary Care Physician:  Rodrigo Ran, MD [address] [place] [address]     Referring Provider: Rodrigo Ran, Md [address] [place],  [place] [address]          Chief Complaint according to patient   Patient presents with:     New Patient (Initial Visit)     CPAP user here without machine, mask or capbles.       HISTORY OF PRESENT ILLNESS:  Corey Ward is a 72 y.o. male patient who is seen upon referral be Dr. Waynard Edwards after a 3 plus year hiatus - his last sleep test showed an AHI below 5/h but he continued to use his CPAP at home, due to his wife's concern for his snoring, but his machine is now displaying an " end of life " message " on 09/15/2022 . Chief concern according to patient :  see above.   I have the pleasure of seeing Corey Ward  on 09/15/22 , a right-handed male with a possible sleep disorder.  He has been losing and gaining weight , he is now 40 pounds lighter then he was in March 2021.   He needs a mouth guard for bruxism and may wan to use one for snoring treatment, he added. He has 2 full knee replacements and a hip.    Sleep relevant medical history: Nocturia 2--6,  had a Tonsillectomy in childhood, Family medical /sleep history: no other family member on CPAP with OSA.    Social history:  Patient is retired from Agricultural consultant but teaches driver's education.  he lives in a household with spouse, no pets , no children. Son lives in [place].  Tobacco use: none .  ETOH use : none ,  Caffeine intake in form of Coffee( /) Soda( 2/ day) Tea ( /) or energy drinks Exercise in form of walking.     Sleep habits are as follows: The patient's dinner time is between 6 PM. The patient goes to bed at 11 PM and continues to sleep for 5 hours, wakes for many,many bathroom breaks. The preferred sleep position is laterally, with the support of 1 pillow. Dreams are reportedly rare/ infrequent. The patient wakes  up spontaneously at 6 without an alarm. 6.315  AM is the usual rise time. He reports not feeling refreshed or restored in AM, with symptoms such as dry mouth, no morning headaches, and residual fatigue.  Naps are taken infrequently, lasting from 10 to 20 minutes in front of the TV.  He has trouble to fall asleep , magnesium, melatonin, tylenol PM are used OTC.    Review of Systems: Out of a complete 14 system review, the patient complains of only the following symptoms, and all other reviewed systems are negative.:  Fatigue, sleepiness , wife reports snoring, fragmented sleep, Insomnia Nocturia    How likely are you to doze in the following situations: 0 = not likely, 1 = slight chance, 2 = moderate chance, 3 = high chance   Sitting and Reading? Watching Television? Sitting inactive in a public place (theater or meeting)? As a passenger in a car for an hour without a break? Lying down in the afternoon when circumstances permit? Sitting and talking to someone? Sitting quietly after lunch without alcohol? In a car, while stopped for a few minutes in traffic?   Total = 11/ 24 points   FSS endorsed at 16/ 63  points.   Social History   Socioeconomic History   Marital status: Married    Spouse name: Not on file   Number of children: Not on file   Years of education: 16   Highest education level: Teacher   Occupational History   Not on file  Tobacco Use   Smoking status: Never   Smokeless tobacco: Never  Vaping Use   Vaping Use: Never used  Substance and Sexual Activity   Alcohol use: No   Drug use: No   Sexual activity: Yes  Other Topics Concern   Not on file  Social History Narrative   Not on file   Social Determinants of Health   Financial Resource Strain: Not on file  Food Insecurity: Not on file  Transportation Needs: Not on file  Physical Activity: Not on file  Stress: Not on file  Social Connections: Not on file    Family History  Problem Relation Age of  Onset   Heart disease Father    Colon cancer Neg Hx    Esophageal cancer Neg Hx    Rectal cancer Neg Hx    Stomach cancer Neg Hx     Past Medical History:  Diagnosis Date   Arthritis    Asthma    When younger no problem now   BPH (benign prostatic hyperplasia)    Diabetes mellitus without complication (HCC)    Gout    Hyperlipidemia    OSA (obstructive sleep apnea)    PONV (postoperative nausea and vomiting)    Sleep apnea    Urinary retention     Past Surgical History:  Procedure Laterality Date   calcium deposits removed from ankle     Left   CARPAL TUNNEL RELEASE Right 12/21/2020   Procedure: RIGHT CARPAL TUNNEL RELEASE;  Surgeon: Cindee Salt, MD;  Location: [facility];  Service: Orthopedics;  Laterality: Right;   INGUINAL HERNIA REPAIR Right    KNEE SURGERY     arthroscopic  surg   REPLACEMENT TOTAL KNEE Bilateral    TENDON REPAIR Right    foot   TONSILLECTOMY     TOTAL HIP ARTHROPLASTY Right 09/08/2018   Procedure: TOTAL HIP ARTHROPLASTY ANTERIOR APPROACH;  Surgeon: Ollen Gross, MD;  Location: [facility];  Service: Orthopedics;  Laterality: Right;      Current Outpatient Medications on File Prior to Visit  Medication Sig Dispense Refill   allopurinol (ZYLOPRIM) 300 MG tablet Take 300 mg by mouth at bedtime.      CANNABIDIOL PO Take 1-2 tablets by mouth at bedtime as needed (sleep). CBD gummies     colchicine 0.6 MG tablet Take 0.6 mg by mouth at bedtime.      Evolocumab (REPATHA [place]) Inject into the skin.     ezetimibe-simvastatin (VYTORIN) 10-40 MG per tablet Take 1 tablet by mouth at bedtime.     finasteride (PROSCAR) 5 MG tablet Take 5 mg by mouth every evening.      meloxicam (MOBIC) 15 MG tablet Take 15 mg by mouth daily.     metFORMIN (GLUCOPHAGE) 500 MG tablet Take 500 mg by mouth 2 (two) times daily with a meal.     tamsulosin (FLOMAX) 0.4 MG CAPS Take 0.4 mg by mouth every evening.      tirzepatide [facility]) 5 MG/0.5ML Pen Inject 5  mg into the skin once a week. 4 mL 3   traMADol (ULTRAM) 50 MG tablet Take 1 tablet (50 mg total) by mouth every 6 (six)  hours as needed. 20 tablet 0   No current facility-administered medications on file prior to visit.    No Known Allergies   DIAGNOSTIC DATA (LABS, IMAGING, TESTING) - I reviewed patient records, labs, notes, testing and imaging myself where available.  Lab Results  Component Value Date   WBC 7.1 05/23/2022   HGB 12.1 (L) 05/23/2022   HCT 36.9 (L) 05/23/2022   MCV 91.3 05/23/2022   PLT 225 05/23/2022      Component Value Date/Time   NA 138 05/23/2022 1229   K 3.8 05/23/2022 1229   CL 102 05/23/2022 1229   CO2 27 05/23/2022 1229   GLUCOSE 128 (H) 05/23/2022 1229   BUN 15 05/23/2022 1229   CREATININE 0.79 05/23/2022 1229   CALCIUM 9.1 05/23/2022 1229   PROT 7.5 09/02/2018 0908   ALBUMIN 4.5 09/02/2018 0908   AST 19 09/02/2018 0908   ALT 14 09/02/2018 0908   ALKPHOS 63 09/02/2018 0908   BILITOT 0.7 09/02/2018 0908   GFRNONAA >60 05/23/2022 1229   GFRAA >60 09/09/2018 0243   No results found for: "CHOL", "HDL", "LDLCALC", "LDLDIRECT", "TRIG", "CHOLHDL" Lab Results  Component Value Date   HGBA1C 6.4 (H) 09/02/2018   No results found for: "VITAMINB12" No results found for: "TSH"  PHYSICAL EXAM:  Today's Vitals   09/15/22 0740  BP: 127/74  Pulse: 63  Weight: 240 lb (108.9 kg)  Height: 6\' 4"  (1.93 m)   Body mass index is 29.21 kg/m.   Wt Readings from Last 3 Encounters:  09/15/22 240 lb (108.9 kg)  05/23/22 260 lb (117.9 kg)  12/21/20 266 lb 5.1 oz (120.8 kg)     Ht Readings from Last 3 Encounters:  09/15/22 6\' 4"  (1.93 m)  05/23/22 6\' 4"  (1.93 m)  12/21/20 6\' 4"  (1.93 m)      General: The patient is awake, alert and appears not in acute distress. The patient is well groomed. Head: Normocephalic, atraumatic. Neck is supple.  Mallampati 3,  neck circumference:17 inches .  Nasal airflow  patent.  Overbite is seen  Dental status:  biological   Cardiovascular:  Regular rate and cardiac rhythm by pulse,  without distended neck veins. Respiratory: Lungs are clear to auscultation.  Skin:  Without evidence of ankle edema, or rash. Trunk: The patient's posture is erect.   NEUROLOGIC EXAM: The patient is awake and alert, oriented to place and time.   Memory subjective described as intact.  Attention span & concentration ability appears normal.  Speech is fluent,  without  dysarthria, dysphonia or aphasia.  Mood and affect are appropriate.   Cranial nerves: no loss of smell or taste reported  Pupils are equal and briskly reactive to light. Extraocular movements in vertical and horizontal planes were intact and without nystagmus. No Diplopia. Visual fields by finger perimetry are intact. Hearing was intact to soft voice and finger rubbing.    Facial sensation intact to fine touch.  Facial motor strength is symmetric and tongue and uvula move midline.  Neck ROM : rotation, tilt and flexion extension were normal for age and shoulder shrug was symmetrical.    Motor exam:  Symmetric bulk, tone and ROM.   Normal tone without cog- wheeling, symmetric grip strength .   Sensory:  Fine touch and vibration were present . Proprioception tested in the upper extremities was normal.   Coordination: Rapid alternating movements in the fingers/hands were of normal speed.  The Finger-to-nose maneuver was intact without evidence of ataxia, dysmetria or  tremor.   Gait and station: Patient could rise unassisted from a seated position, walked without assistive device.  Stance is of normal width/ base and the patient turned with 3 steps.  Toe and heel walk were deferred.  Deep tendon reflexes: in the  upper and lower extremities are symmetric and intact.  Babinski response was deferred.    ASSESSMENT AND PLAN 72 year -old male here with: Decades of CPAP use, at this time his 2 main complaints are frequent nocturia and persistent snoring  he also has trouble initiating sleep but has remarkably little difficulty falling asleep and unscheduled naps.  His risk factors for obstructive sleep apnea are a high-grade Mallampati and moderate BMI.  He has a very short airway, steep. In comparison to his home sleep test, a little over 3 years ago he has lost 38 pounds.  His very first test with [place] was at [place] [facility] in the year 2012 and on January 4 and at that time he endorsed the Epworth Sleepiness Scale 20 out of 24 points, the depression inventory at 11 points and his BMI was 32.3.   He had still mild apnea even then when his first initial CPAP was prescribed based on an AHI of 8.3/h but in supine sleep this was exacerbated to 55.  He did also have not infrequent.  At the movements and there was still some snoring.  No hypoxemia.   He has been on S9 AutoSet ever since the machine is now 71 years old and at the end of its life.    I will order a new home sleep test to see if the patient qualifies for a new CPAP.  I also want to address snoring treatment in case that he does not qualify for CPAP and to pursue this with his local sleep dentist.  At this time he is neither excessively sleepy nor excessively fatigued.    I plan to follow up either personally or through our NP within 3-4 months.   I would like to thank  Rodrigo Ran, Md [address] [place],  [place] [address] for allowing me to meet with and to take care of this pleasant patient.    After spending a total time of  35  minutes face to face and additional time for physical and neurologic examination, review of laboratory studies,  personal review of imaging studies, reports and results of other testing and review of referral information / records as far as provided in visit,   Electronically signed by: Melvyn Novas, MD 09/15/2022 8:09 AM  [facility] Board certified by The [employer] of Sleep Medicine and Diplomate of the  [employer] of [employer]. Board certified In Neurology through the ABPN, Fellow of the [employer] of Neurology. Medical Director of [facility].

## 2022-10-03 ENCOUNTER — Other Ambulatory Visit (HOSPITAL_BASED_OUTPATIENT_CLINIC_OR_DEPARTMENT_OTHER): Payer: Self-pay

## 2022-10-07 DIAGNOSIS — M109 Gout, unspecified: Secondary | ICD-10-CM | POA: Diagnosis not present

## 2022-10-07 DIAGNOSIS — I2510 Atherosclerotic heart disease of native coronary artery without angina pectoris: Secondary | ICD-10-CM | POA: Diagnosis not present

## 2022-10-07 DIAGNOSIS — E785 Hyperlipidemia, unspecified: Secondary | ICD-10-CM | POA: Diagnosis not present

## 2022-10-07 DIAGNOSIS — R32 Unspecified urinary incontinence: Secondary | ICD-10-CM | POA: Diagnosis not present

## 2022-10-07 DIAGNOSIS — M545 Low back pain, unspecified: Secondary | ICD-10-CM | POA: Diagnosis not present

## 2022-10-07 DIAGNOSIS — Z8249 Family history of ischemic heart disease and other diseases of the circulatory system: Secondary | ICD-10-CM | POA: Diagnosis not present

## 2022-10-07 DIAGNOSIS — G4733 Obstructive sleep apnea (adult) (pediatric): Secondary | ICD-10-CM | POA: Diagnosis not present

## 2022-10-07 DIAGNOSIS — M1990 Unspecified osteoarthritis, unspecified site: Secondary | ICD-10-CM | POA: Diagnosis not present

## 2022-10-07 DIAGNOSIS — E119 Type 2 diabetes mellitus without complications: Secondary | ICD-10-CM | POA: Diagnosis not present

## 2022-10-21 ENCOUNTER — Ambulatory Visit: Payer: Medicare PPO | Admitting: Neurology

## 2022-10-21 DIAGNOSIS — R634 Abnormal weight loss: Secondary | ICD-10-CM

## 2022-10-21 DIAGNOSIS — R0683 Snoring: Secondary | ICD-10-CM

## 2022-10-21 DIAGNOSIS — G478 Other sleep disorders: Secondary | ICD-10-CM

## 2022-10-21 DIAGNOSIS — G4733 Obstructive sleep apnea (adult) (pediatric): Secondary | ICD-10-CM | POA: Diagnosis not present

## 2022-10-22 NOTE — Progress Notes (Signed)
[facility]  Kandyce Rud Male, 72 y.o., 1950-05-08    HOME SLEEP TEST REPORT ( by Watch PAT)   STUDY DATE:  10-22-2022 ORDERING CLINICIAN: Melvyn Novas, MD  REFERRING CLINICIAN: Rodrigo Ran , MD    CLINICAL INFORMATION/HISTORY: Corey Ward is a 72 y.o. male patient who is seen upon referral by PCP Dr. Waynard Edwards after a 3 plus year hiatus - his last sleep test showed an AHI below 5/h but he continued to use his CPAP at home, due to his wife's concern for his snoring, but his machine is now displaying an " end of life " message " on 09/15/2022 . Chief concern according to patient :  see above.   I have the pleasure of seeing [name]  on 09/15/22 , a right-handed male with a possible sleep disorder.  He has been losing and gaining weight , he is now 40 pounds lighter then he was in March 2021. He needs a mouth guard for bruxism and may wan to use one for snoring treatment, he added. He has 2 full knee replacements and a hip. Nocturia 2--6,  overbite is present.     Epworth sleepiness score: 11/24. FSS at 16/ 63 points.    BMI:  34 kg/m   Neck Circumference: 17"   FINDINGS:   Sleep Summary:   Total Recording Time (hours, min): 7 hours 25 minutes       Total Sleep Time (hours, min):    4 hours 33 minutes             Percent REM (%):   16.8%     REM sleep latency was 269 minutes and general sleep latency 30 minutes long.  The patient is considered CPAP dependent and showed during this night without CPAP use  wakefulness  time after sleep onset of 142 minutes.                                  Respiratory Indices:   Calculated pAHI (per hour): 10/h                            REM pAHI:     13.2/h                                            NREM pAHI: 9.3/h                            Supine AHI:   The patient slept about 204 minutes in supine position associated with an AHI of 13.1/h, the nonsupine AHI was 0.9/h.  Snoring level reached a mean  volume of 41 dB just above threshold and was present for 15% of total sleep time.                                                Oxygen Saturation Statistics:     O2 Saturation Range (%): Between a nadir at 85 and a maximum saturation of 98% with a mean saturation  of 93%.                                     O2 Saturation (minutes) <89%: 0.1 minutes         Pulse Rate Statistics:              Pulse Range: Between and lowest heart rate of 46 and a maximum heart rate of 80 bpm with a mean heart rate of 53 bpm               IMPRESSION:  This HST confirms the presence of mild obstructive sleep apnea and hypopnea by AASM criteria ,with an much higher RDI than AHI.  This patient has suffered from upper airway resistance syndrome for a long time and is CPAP dependent.   RECOMMENDATION: I will renew the CPAP order for this patient.  Continue CPAP use is optional for mild apnea but the patient's sleep without CPAP is much more fragmented than with CPAP.  An auto titration CPAP will be provided with an setting between 5 and 12 cm water pressure, 2 cm EPR, heated humidification and interface of patient's choice.    INTERPRETING PHYSICIAN:   Melvyn Novas, MD

## 2022-11-05 ENCOUNTER — Telehealth: Payer: Self-pay | Admitting: Neurology

## 2022-11-05 NOTE — Telephone Encounter (Signed)
Pt states he had his sleep study on 8-06, he would like a call with results as soon as they are available.

## 2022-11-09 NOTE — Procedures (Signed)
[facility]  Corey Ward Male, 72 y.o., 1950-07-13    HOME SLEEP TEST REPORT ( by Watch PAT)   STUDY DATE:  10-22-2022 ORDERING CLINICIAN: Melvyn Novas, MD  REFERRING CLINICIAN: Rodrigo Ran , MD    CLINICAL INFORMATION/HISTORY: Corey Ward is a 72 y.o. male patient who is seen upon referral by PCP Dr. Waynard Edwards after a 3 plus year hiatus - his last sleep test showed an AHI below 5/h but he continued to use his CPAP at home, due to his wife's concern for his snoring, but his machine is now displaying an " end of life " message " on 09/15/2022 . Chief concern according to patient :  see above.   I have the pleasure of seeing Corey Ward  on 09/15/22 , a right-handed male with a possible sleep disorder.  He has been losing and gaining weight , he is now 40 pounds lighter then he was in March 2021. He needs a mouth guard for bruxism and may wan to use one for snoring treatment, he added. He has 2 full knee replacements and a hip. Nocturia 2--6,  overbite is present.     Epworth sleepiness score: 11/24. FSS at 16/ 63 points.    BMI:  34 kg/m   Neck Circumference: 17"   FINDINGS:   Sleep Summary:   Total Recording Time (hours, min): 7 hours 25 minutes       Total Sleep Time (hours, min):    4 hours 33 minutes             Percent REM (%):   16.8%     REM sleep latency was 269 minutes and general sleep latency 30 minutes long.  The patient is considered CPAP dependent and showed during this night without CPAP use  wakefulness  time after sleep onset of 142 minutes.                                  Respiratory Indices:   Calculated pAHI (per hour): 10/h                            REM pAHI:     13.2/h                                            NREM pAHI: 9.3/h                            Supine AHI:   The patient slept about 204 minutes in supine position associated with an AHI of 13.1/h, the nonsupine AHI was 0.9/h.  Snoring level reached a mean volume of  41 dB just above threshold and was present for 15% of total sleep time.                                                Oxygen Saturation Statistics:     O2 Saturation Range (%): Between a nadir at 85 and a maximum saturation of 98% with a mean saturation of 93%.  O2 Saturation (minutes) <89%: 0.1 minutes         Pulse Rate Statistics:              Pulse Range: Between and lowest heart rate of 46 and a maximum heart rate of 80 bpm with a mean heart rate of 53 bpm               IMPRESSION:  This HST confirms the presence of mild obstructive sleep apnea and hypopnea by AASM criteria ,with an much higher RDI than AHI.  This patient has suffered from upper airway resistance syndrome for a long time and is CPAP dependent.   RECOMMENDATION: I will renew the CPAP order for this patient.  Continue CPAP use is optional for mild apnea but the patient's sleep without CPAP is much more fragmented than with CPAP.  An auto titration CPAP will be provided with an setting between 5 and 12 cm water pressure, 2 cm EPR, heated humidification and interface of patient's choice.    INTERPRETING PHYSICIAN:   Melvyn Novas, MD

## 2022-11-10 NOTE — Telephone Encounter (Signed)
Called the pt and was able to review the sleep study results with him. He would like to continue CPAP. Advised I would send the updated order to [facility] For them to set him up with a new machine. Set a f/u apt for the pt for 10/30 at 8:30 am.  "Mild AHI , PAP use is optional, main  indication is UARS."

## 2022-11-20 ENCOUNTER — Other Ambulatory Visit (HOSPITAL_BASED_OUTPATIENT_CLINIC_OR_DEPARTMENT_OTHER): Payer: Self-pay

## 2022-11-20 DIAGNOSIS — I2510 Atherosclerotic heart disease of native coronary artery without angina pectoris: Secondary | ICD-10-CM | POA: Diagnosis not present

## 2022-11-20 DIAGNOSIS — G4730 Sleep apnea, unspecified: Secondary | ICD-10-CM | POA: Diagnosis not present

## 2022-11-20 DIAGNOSIS — E1169 Type 2 diabetes mellitus with other specified complication: Secondary | ICD-10-CM | POA: Diagnosis not present

## 2022-11-20 DIAGNOSIS — E669 Obesity, unspecified: Secondary | ICD-10-CM | POA: Diagnosis not present

## 2022-11-20 MED ORDER — MOUNJARO 7.5 MG/0.5ML ~~LOC~~ SOAJ
7.5000 mg | SUBCUTANEOUS | 5 refills | Status: DC
  Filled 2022-11-20: qty 2, 28d supply, fill #0
  Filled 2022-12-19: qty 2, 28d supply, fill #1
  Filled 2023-01-14: qty 2, 28d supply, fill #2
  Filled 2023-02-27: qty 2, 28d supply, fill #3
  Filled 2023-06-17: qty 2, 28d supply, fill #4
  Filled 2023-07-13: qty 2, 28d supply, fill #5

## 2022-11-24 DIAGNOSIS — G4733 Obstructive sleep apnea (adult) (pediatric): Secondary | ICD-10-CM | POA: Diagnosis not present

## 2022-12-10 DIAGNOSIS — N401 Enlarged prostate with lower urinary tract symptoms: Secondary | ICD-10-CM | POA: Diagnosis not present

## 2022-12-10 DIAGNOSIS — R338 Other retention of urine: Secondary | ICD-10-CM | POA: Diagnosis not present

## 2022-12-20 DIAGNOSIS — Z23 Encounter for immunization: Secondary | ICD-10-CM | POA: Diagnosis not present

## 2022-12-23 ENCOUNTER — Ambulatory Visit: Payer: Medicare PPO

## 2022-12-23 ENCOUNTER — Encounter: Payer: Self-pay | Admitting: Emergency Medicine

## 2022-12-23 ENCOUNTER — Ambulatory Visit
Admission: EM | Admit: 2022-12-23 | Discharge: 2022-12-23 | Disposition: A | Discharge status: Home / Self Care | Payer: Medicare PPO | Attending: Family Medicine | Admitting: Family Medicine

## 2022-12-23 DIAGNOSIS — S60931A Unspecified superficial injury of right thumb, initial encounter: Secondary | ICD-10-CM

## 2022-12-23 DIAGNOSIS — S62514A Nondisplaced fracture of proximal phalanx of right thumb, initial encounter for closed fracture: Secondary | ICD-10-CM

## 2022-12-23 DIAGNOSIS — M19041 Primary osteoarthritis, right hand: Secondary | ICD-10-CM | POA: Diagnosis not present

## 2022-12-23 DIAGNOSIS — M542 Cervicalgia: Secondary | ICD-10-CM

## 2022-12-23 DIAGNOSIS — R338 Other retention of urine: Secondary | ICD-10-CM | POA: Diagnosis not present

## 2022-12-23 DIAGNOSIS — R0789 Other chest pain: Secondary | ICD-10-CM

## 2022-12-23 DIAGNOSIS — S62619A Displaced fracture of proximal phalanx of unspecified finger, initial encounter for closed fracture: Secondary | ICD-10-CM | POA: Diagnosis not present

## 2022-12-23 MED ORDER — TIZANIDINE HCL 4 MG PO TABS: 4.0000 mg | ORAL_TABLET | Freq: Four times a day (QID) | ORAL | 0 refills | Status: DC | PRN

## 2022-12-23 NOTE — Discharge Instructions (Signed)
Use ice and elevation to reduce swelling and pain and thumb Leave brace on as much as you are able Take over-the-counter medicines as needed for pain Take tizanidine as needed as muscle relaxer.  It is useful to take at night I have placed a referral for you to see Dr. Merlyn Lot.  His numbers on this form Call or return for problems
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
## 2022-12-24 DIAGNOSIS — K4090 Unilateral inguinal hernia, without obstruction or gangrene, not specified as recurrent: Secondary | ICD-10-CM | POA: Diagnosis not present

## 2022-12-24 DIAGNOSIS — G4733 Obstructive sleep apnea (adult) (pediatric): Secondary | ICD-10-CM | POA: Diagnosis not present

## 2022-12-24 DIAGNOSIS — K4091 Unilateral inguinal hernia, without obstruction or gangrene, recurrent: Secondary | ICD-10-CM | POA: Diagnosis not present

## 2022-12-25 DIAGNOSIS — R3912 Poor urinary stream: Secondary | ICD-10-CM | POA: Diagnosis not present

## 2022-12-25 DIAGNOSIS — N401 Enlarged prostate with lower urinary tract symptoms: Secondary | ICD-10-CM | POA: Diagnosis not present

## 2022-12-26 DIAGNOSIS — G4733 Obstructive sleep apnea (adult) (pediatric): Secondary | ICD-10-CM | POA: Diagnosis not present

## 2022-12-31 DIAGNOSIS — S62511A Displaced fracture of proximal phalanx of right thumb, initial encounter for closed fracture: Secondary | ICD-10-CM | POA: Diagnosis not present

## 2022-12-31 DIAGNOSIS — M25641 Stiffness of right hand, not elsewhere classified: Secondary | ICD-10-CM | POA: Diagnosis not present

## 2022-12-31 DIAGNOSIS — M79644 Pain in right finger(s): Secondary | ICD-10-CM | POA: Diagnosis not present

## 2023-01-12 DIAGNOSIS — S62511A Displaced fracture of proximal phalanx of right thumb, initial encounter for closed fracture: Secondary | ICD-10-CM | POA: Diagnosis not present

## 2023-01-14 ENCOUNTER — Ambulatory Visit: Payer: Medicare PPO | Admitting: Neurology
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
## 2023-01-21 ENCOUNTER — Encounter: Payer: Self-pay | Admitting: Neurology

## 2023-01-21 ENCOUNTER — Ambulatory Visit: Payer: Medicare PPO | Admitting: Neurology

## 2023-01-21 VITALS — BP 124/70 | Resp 15 | Ht 76.0 in | Wt 228.6 lb

## 2023-01-21 DIAGNOSIS — G4733 Obstructive sleep apnea (adult) (pediatric): Secondary | ICD-10-CM

## 2023-01-21 NOTE — Patient Instructions (Signed)
Continue nightly CPAP usage.  Recommend minimum of 4 hours nightly utilization.  Continue current settings.  I will see back in 1 year virtually.  Let me know if you need anything.  Thanks!!

## 2023-01-24 DIAGNOSIS — G4733 Obstructive sleep apnea (adult) (pediatric): Secondary | ICD-10-CM | POA: Diagnosis not present

## 2023-01-27 ENCOUNTER — Encounter: Payer: Self-pay | Admitting: Gastroenterology

## 2023-01-27 DIAGNOSIS — R351 Nocturia: Secondary | ICD-10-CM | POA: Diagnosis not present

## 2023-01-27 DIAGNOSIS — N401 Enlarged prostate with lower urinary tract symptoms: Secondary | ICD-10-CM | POA: Diagnosis not present

## 2023-01-27 DIAGNOSIS — R3912 Poor urinary stream: Secondary | ICD-10-CM | POA: Diagnosis not present

## 2023-01-27 DIAGNOSIS — R338 Other retention of urine: Secondary | ICD-10-CM | POA: Diagnosis not present

## 2023-02-02 DIAGNOSIS — S62511A Displaced fracture of proximal phalanx of right thumb, initial encounter for closed fracture: Secondary | ICD-10-CM | POA: Diagnosis not present

## 2023-02-23 DIAGNOSIS — G4733 Obstructive sleep apnea (adult) (pediatric): Secondary | ICD-10-CM | POA: Diagnosis not present

## 2023-02-24 ENCOUNTER — Ambulatory Visit: Payer: Self-pay | Admitting: Surgery

## 2023-02-27 DIAGNOSIS — S62511A Displaced fracture of proximal phalanx of right thumb, initial encounter for closed fracture: Secondary | ICD-10-CM | POA: Diagnosis not present

## 2023-02-27 DIAGNOSIS — M1811 Unilateral primary osteoarthritis of first carpometacarpal joint, right hand: Secondary | ICD-10-CM | POA: Diagnosis not present

## 2023-02-27 DIAGNOSIS — M19041 Primary osteoarthritis, right hand: Secondary | ICD-10-CM | POA: Diagnosis not present

## 2023-03-05 DIAGNOSIS — G4733 Obstructive sleep apnea (adult) (pediatric): Secondary | ICD-10-CM | POA: Diagnosis not present

## 2023-03-06 NOTE — Patient Instructions (Signed)
SURGICAL WAITING ROOM VISITATION  Patients having surgery or a procedure may have no more than 2 support people in the waiting area - these visitors may rotate.    Children under the age of 24 must have an adult with them who is not the patient.  Due to an increase in RSV and influenza rates and associated hospitalizations, children ages 56 and under may not visit patients in [facility] hospitals.  If the patient needs to stay at the hospital during part of their recovery, the visitor guidelines for inpatient rooms apply. Pre-op nurse will coordinate an appropriate time for 1 support person to accompany patient in pre-op.  This support person may not rotate.    Please refer to the [facility] website for the visitor guidelines for Inpatients (after your surgery is over and you are in a regular room).    Your procedure is scheduled on: 03/23/23   Report to [facility] Main Entrance    Report to admitting at 9:45 AM   Call this number if you have problems the morning of surgery [phone]   Do not eat food :After Midnight.   After Midnight you may have the following liquids until 9:00 AM DAY OF SURGERY  Water Non-Citrus Juices (without pulp, NO RED-Apple, White grape, White cranberry) Black Coffee (NO MILK/CREAM OR CREAMERS, sugar ok)  Clear Tea (NO MILK/CREAM OR CREAMERS, sugar ok) regular and decaf                             Plain Jell-O (NO RED)                                           Fruit ices (not with fruit pulp, NO RED)                                     Popsicles (NO RED)                                                               Sports drinks like Gatorade (NO RED)                      If you have questions, please contact your surgeon's office.   FOLLOW BOWEL PREP AND ANY ADDITIONAL PRE OP INSTRUCTIONS YOU RECEIVED FROM YOUR SURGEON'S OFFICE!!!     Oral Hygiene is also important to reduce your risk of infection.                                     Remember - BRUSH YOUR TEETH THE MORNING OF SURGERY WITH YOUR REGULAR TOOTHPASTE  DENTURES WILL BE REMOVED PRIOR TO SURGERY PLEASE DO NOT APPLY "Poly grip" OR ADHESIVES!!!   Stop all vitamins and herbal supplements 7 days before surgery.   Take these medicines the morning of surgery with A SIP OF WATER: Tamsulosin   DO NOT TAKE ANY ORAL DIABETIC MEDICATIONS DAY OF YOUR SURGERY  How to Manage Your Diabetes Before and After Surgery  Why is it important to control my blood sugar before and after surgery? Improving blood sugar levels before and after surgery helps healing and can limit problems. A way of improving blood sugar control is eating a healthy diet by:  Eating less sugar and carbohydrates  Increasing activity/exercise  Talking with your doctor about reaching your blood sugar goals High blood sugars (greater than 180 mg/dL) can raise your risk of infections and slow your recovery, so you will need to focus on controlling your diabetes during the weeks before surgery. Make sure that the doctor who takes care of your diabetes knows about your planned surgery including the date and location.  How do I manage my blood sugar before surgery? Check your blood sugar at least 4 times a day, starting 2 days before surgery, to make sure that the level is not too high or low. Check your blood sugar the morning of your surgery when you wake up and every 2 hours until you get to the Short Stay unit. If your blood sugar is less than 70 mg/dL, you will need to treat for low blood sugar: Do not take insulin. Treat a low blood sugar (less than 70 mg/dL) with  cup of clear juice (cranberry or apple), 4 glucose tablets, OR glucose gel. Recheck blood sugar in 15 minutes after treatment (to make sure it is greater than 70 mg/dL). If your blood sugar is not greater than 70 mg/dL on recheck, call [phone] for further instructions. Report your blood sugar to the short stay nurse when you get to Short  Stay.  If you are admitted to the hospital after surgery: Your blood sugar will be checked by the staff and you will probably be given insulin after surgery (instead of oral diabetes medicines) to make sure you have good blood sugar levels. The goal for blood sugar control after surgery is 80-180 mg/dL.   WHAT DO I DO ABOUT MY DIABETES MEDICATION?  Do not take oral diabetes medicines (pills) the morning of surgery.  Hold Mounjaro 7 days.  THE DAY BEFORE SURGERY, take Metformin as prescribed.     THE MORNING OF SURGERY, do not take Metformin.   DO NOT TAKE THE FOLLOWING 7 DAYS PRIOR TO SURGERY: Ozempic, Wegovy, Rybelsus (Semaglutide), Byetta (exenatide), Bydureon (exenatide ER), Victoza, Saxenda (liraglutide), or Trulicity (dulaglutide) Mounjaro (Tirzepatide) Adlyxin (Lixisenatide), Polyethylene Glycol Loxenatide.  Reviewed and Endorsed by [facility] Patient Education Committee, August 2015  Bring CPAP mask and tubing day of surgery.                              You may not have any metal on your body including jewelry, and body piercing             Do not wear lotions, powders, cologne, or deodorant              Men may shave face and neck.   Do not bring valuables to the hospital. [place] IS NOT             RESPONSIBLE   FOR VALUABLES.   Contacts, glasses, dentures or bridgework may not be worn into surgery.  DO NOT BRING YOUR HOME MEDICATIONS TO THE HOSPITAL. PHARMACY WILL DISPENSE MEDICATIONS LISTED ON YOUR MEDICATION LIST TO YOU DURING YOUR ADMISSION IN THE HOSPITAL!    Patients discharged on the day of surgery will  not be allowed to drive home.  Someone NEEDS to stay with you for the first 24 hours after anesthesia.   Special Instructions: Bring a copy of your healthcare power of attorney and living will documents the day of surgery if you haven't scanned them before.              Please read over the following fact sheets you were given: IF YOU HAVE QUESTIONS ABOUT  YOUR PRE-OP INSTRUCTIONS PLEASE CALL [phone] [place]ras   If you received a COVID test during your pre-op visit  it is requested that you wear a mask when out in public, stay away from anyone that may not be feeling well and notify your surgeon if you develop symptoms. If you test positive for Covid or have been in contact with anyone that has tested positive in the last 10 days please notify you surgeon.    [facility] - Preparing for Surgery Before surgery, you can play an important role.  Because skin is not sterile, your skin needs to be as free of germs as possible.  You can reduce the number of germs on your skin by washing with CHG (chlorahexidine gluconate) soap before surgery.  CHG is an antiseptic cleaner which kills germs and bonds with the skin to continue killing germs even after washing. Please DO NOT use if you have an allergy to CHG or antibacterial soaps.  If your skin becomes reddened/irritated stop using the CHG and inform your nurse when you arrive at Short Stay. Do not shave (including legs and underarms) for at least 48 hours prior to the first CHG shower.  You may shave your face/neck.  Please follow these instructions carefully:  1.  Shower with CHG Soap the night before surgery and the  morning of surgery.  2.  If you choose to wash your hair, wash your hair first as usual with your normal  shampoo.  3.  After you shampoo, rinse your hair and body thoroughly to remove the shampoo.                             4.  Use CHG as you would any other liquid soap.  You can apply chg directly to the skin and wash.  Gently with a scrungie or clean washcloth.  5.  Apply the CHG Soap to your body ONLY FROM THE NECK DOWN.   Do   not use on face/ open                           Wound or open sores. Avoid contact with eyes, ears mouth and   genitals (private parts).                       Wash face,  Genitals (private parts) with your normal soap.             6.  Wash thoroughly, paying  special attention to the area where your    surgery  will be performed.  7.  Thoroughly rinse your body with warm water from the neck down.  8.  DO NOT shower/wash with your normal soap after using and rinsing off the CHG Soap.                9.  Pat yourself dry with a clean towel.  10.  Wear clean pajamas.            11.  Place clean sheets on your bed the night of your first shower and do not  sleep with pets. Day of Surgery : Do not apply any lotions/deodorants the morning of surgery.  Please wear clean clothes to the hospital/surgery center.  FAILURE TO FOLLOW THESE INSTRUCTIONS MAY RESULT IN THE CANCELLATION OF YOUR SURGERY  PATIENT SIGNATURE_________________________________  NURSE SIGNATURE__________________________________  ________________________________________________________________________

## 2023-03-06 NOTE — Progress Notes (Signed)
COVID Vaccine Completed:  Date of COVID positive in last 90 days:  PCP - Rodrigo Ran, MD Cardiologist -  Neurology- Melvyn Novas, MD  CT- 08/28/22 Epic Chest x-ray -  EKG - 05/23/22 Epic Stress Test -  ECHO -  Cardiac Cath -  Pacemaker/ICD device last checked: Spinal Cord Stimulator:  Bowel Prep -   Sleep Study -  CPAP -   Fasting Blood Sugar -  Checks Blood Sugar _____ times a day  Last dose of GLP1 agonist-  Mounjaro GLP1 instructions:  Hold 7 days before surgery    Last dose of SGLT-2 inhibitors-  N/A SGLT-2 instructions:  Hold 3 days before surgery    Blood Thinner Instructions:  Time Aspirin Instructions: Last Dose:  Activity level:  Can go up a flight of stairs and perform activities of daily living without stopping and without symptoms of chest pain or shortness of breath.  Able to exercise without symptoms  Unable to go up a flight of stairs without symptoms of     Anesthesia review: atherosclerotic heart disease, OSA, DM2  Patient denies shortness of breath, fever, cough and chest pain at PAT appointment  Patient verbalized understanding of instructions that were given to them at the PAT appointment. Patient was also instructed that they will need to review over the PAT instructions again at home before surgery.

## 2023-03-12 ENCOUNTER — Other Ambulatory Visit: Payer: Self-pay

## 2023-03-12 ENCOUNTER — Encounter (HOSPITAL_COMMUNITY): Payer: Self-pay

## 2023-03-12 ENCOUNTER — Encounter (HOSPITAL_COMMUNITY)
Admission: RE | Admit: 2023-03-12 | Discharge: 2023-03-12 | Disposition: A | Discharge status: Home / Self Care | Payer: Medicare PPO | Source: Ambulatory Visit | Attending: Surgery | Admitting: Surgery

## 2023-03-12 VITALS — BP 119/74 | HR 53 | Temp 98.0°F | Resp 16 | Ht 76.0 in | Wt 230.0 lb

## 2023-03-12 DIAGNOSIS — E119 Type 2 diabetes mellitus without complications: Secondary | ICD-10-CM | POA: Insufficient documentation

## 2023-03-12 DIAGNOSIS — Z01812 Encounter for preprocedural laboratory examination: Secondary | ICD-10-CM | POA: Insufficient documentation

## 2023-03-12 LAB — BASIC METABOLIC PANEL
Anion gap: 9 (ref 5–15)
BUN: 14 mg/dL (ref 8–23)
CO2: 24 mmol/L (ref 22–32)
Calcium: 9.2 mg/dL (ref 8.9–10.3)
Chloride: 105 mmol/L (ref 98–111)
Creatinine, Ser: 0.77 mg/dL (ref 0.61–1.24)
GFR, Estimated: >60 mL/min (ref >60)
Glucose, Bld: 115 mg/dL — ABNORMAL HIGH (ref 70–99)
Potassium: 4.5 mmol/L (ref 3.5–5.1)
Sodium: 138 mmol/L (ref 135–145)

## 2023-03-12 LAB — CBC
HCT: 39.4 % (ref 39.0–52.0)
Hemoglobin: 12.5 g/dL — ABNORMAL LOW (ref 13.0–17.0)
MCH: 30 pg (ref 26.0–34.0)
MCHC: 31.7 g/dL (ref 30.0–36.0)
MCV: 94.5 fL (ref 80.0–100.0)
Platelets: 197 10*3/uL (ref 150–400)
RBC: 4.17 MIL/uL — ABNORMAL LOW (ref 4.22–5.81)
RDW: 14.4 % (ref 11.5–15.5)
WBC: 6.8 10*3/uL (ref 4.0–10.5)
nRBC: 0 % (ref 0.0–0.2)

## 2023-03-12 LAB — GLUCOSE, CAPILLARY: Glucose-Capillary: 104 mg/dL — ABNORMAL HIGH (ref 70–99)

## 2023-03-13 LAB — HEMOGLOBIN A1C
Hgb A1c MFr Bld: 5.9 % — ABNORMAL HIGH (ref 4.8–5.6)
Mean Plasma Glucose: 123 mg/dL

## 2023-03-16 ENCOUNTER — Encounter (HOSPITAL_COMMUNITY): Payer: Self-pay

## 2023-03-16 NOTE — Progress Notes (Addendum)
Case: [id number] Date/Time: 03/23/23 1145   Procedure: LAPAROSCOPIC BILATERAL INGUINAL HERNIA REPAIR WITH MESH (Bilateral)   Anesthesia type: General   Pre-op diagnosis: recurrent right and initial left inguinal hernias   Location: [facility] ROOM 05 / [facility]   Surgeons: Stechschulte, Hyman Hopes, MD       DISCUSSION: Corey Ward is a 72 yo male who presents to PAT prior to surgery above. PMH of OSA (uses CPAP), DM, arthritis.  Patient follows with PCP. Last seen on 11/20/22. He has hx of CAD (by coronary calcium scan) and pulmonary nodules that he is getting annual CT Chests. Last one was on 08/28/22 and appeared stable. He denies CP/SOB at PAT visit and has good functional status. He is on medical therapy for CAD. Diabetes and BP are controlled.  VS: BP 119/74   Pulse (!) 53   Temp 36.7 C (Oral)   Resp 16   Ht 6\' 4"  (1.93 m)   Wt 104.3 kg   SpO2 99%   BMI 28.00 kg/m   PROVIDERS: Rodrigo Ran, MD   LABS: Labs reviewed: Acceptable for surgery. (all labs ordered are listed, but only abnormal results are displayed)  Labs Reviewed  HEMOGLOBIN A1C - Abnormal; Notable for the following components:      Result Value   Hgb A1c MFr Bld 5.9 (*)    All other components within normal limits  BASIC METABOLIC PANEL - Abnormal; Notable for the following components:   Glucose, Bld 115 (*)    All other components within normal limits  CBC - Abnormal; Notable for the following components:   RBC 4.17 (*)    Hemoglobin 12.5 (*)    All other components within normal limits  GLUCOSE, CAPILLARY - Abnormal; Notable for the following components:   Glucose-Capillary 104 (*)    All other components within normal limits     IMAGES: CT Chest 08/28/22  IMPRESSION: 1. Stable 5 mm central left upper lobe subsolid nodule dating back to 01/31/2020. No new or enlarging nodules. Recommend continued annual follow-up until there is 5 years of stability. This recommendation follows the consensus statement:  Guidelines for Management of incidental Pulmonary Nodules Detected on CT Images: From the Fleischner Society 2017; Radiology 2017; 284:228-243. 2. Aortic Atherosclerosis (ICD10-I70.0). Coronary artery calcifications. Assessment for potential risk factor modification, dietary therapy or pharmacologic therapy may be warranted, if clinically indicated.   EKG:   CV:  CT Coronary Calcium 01/31/20:  IMPRESSION: 1. Coronary artery calcium score is 82.1 and this is at percentile 47 for patients of the same age, gender and ethnicity. 2. Few scattered nodular densities in the lungs. 6 mm ground-glass opacity in the left upper lobe is indeterminate. Initial follow-up with CT at 6-12 months is recommended to confirm persistence. If persistent, repeat CT is recommended every 2 years until 5 years of stability has been established. This recommendation follows the consensus statement: Guidelines for Management of Incidental Pulmonary Nodules Detected on CT Images: From the Fleischner Society 2017; Radiology 2017; 284:228-243. Past Medical History:  Diagnosis Date   Arthritis    Asthma    When younger no problem now   BPH (benign prostatic hyperplasia)    Diabetes mellitus without complication (HCC)    Gout    Hyperlipidemia    OSA (obstructive sleep apnea)    PONV (postoperative nausea and vomiting)    Sleep apnea    Urinary retention     Past Surgical History:  Procedure Laterality Date   calcium deposits removed from  ankle     Left   CARPAL TUNNEL RELEASE Right 12/21/2020   Procedure: RIGHT CARPAL TUNNEL RELEASE;  Surgeon: Cindee Salt, MD;  Location: [facility];  Service: Orthopedics;  Laterality: Right;   INGUINAL HERNIA REPAIR Right    KNEE SURGERY     arthroscopic  surg   REPLACEMENT TOTAL KNEE Bilateral    TENDON REPAIR Right    foot   TONSILLECTOMY     TOTAL HIP ARTHROPLASTY Right 09/08/2018   Procedure: TOTAL HIP ARTHROPLASTY ANTERIOR APPROACH;  Surgeon:  Ollen Gross, MD;  Location: [facility];  Service: Orthopedics;  Laterality: Right;     MEDICATIONS:  allopurinol (ZYLOPRIM) 300 MG tablet   CANNABIDIOL PO   colchicine 0.6 MG tablet   ezetimibe-simvastatin (VYTORIN) 10-40 MG per tablet   finasteride (PROSCAR) 5 MG tablet   MELATONIN PO   metFORMIN (GLUCOPHAGE) 500 MG tablet   sildenafil (REVATIO) 20 MG tablet   tamsulosin (FLOMAX) 0.4 MG CAPS   tirzepatide (MOUNJARO) 7.5 MG/0.5ML Pen   No current facility-administered medications for this encounter.   Marcille Blanco [facility] Short Stay/Anesthesiology [facility] Phone [phone] 03/16/2023 11:19 AM

## 2023-03-16 NOTE — Anesthesia Preprocedure Evaluation (Signed)
Anesthesia Evaluation    Airway        Dental   Pulmonary           Cardiovascular      Neuro/Psych    GI/Hepatic   Endo/Other  diabetes    Renal/GU      Musculoskeletal   Abdominal   Peds  Hematology   Anesthesia Other Findings   Reproductive/Obstetrics                              Anesthesia Physical Anesthesia Plan  ASA:   Anesthesia Plan:    Post-op Pain Management:    Induction:   PONV Risk Score and Plan:   Airway Management Planned:   Additional Equipment:   Intra-op Plan:   Post-operative Plan:   Informed Consent:   Plan Discussed with:   Anesthesia Plan Comments: (See PAT note from 12/26 by Sherlie Ban PA-C )         Anesthesia Quick Evaluation

## 2023-03-23 ENCOUNTER — Ambulatory Visit (HOSPITAL_COMMUNITY)
Admission: RE | Admit: 2023-03-23 | Discharge: 2023-03-23 | Disposition: A | Discharge status: Home / Self Care | Payer: Medicare PPO | Attending: Surgery | Admitting: Surgery

## 2023-03-23 ENCOUNTER — Other Ambulatory Visit: Payer: Self-pay

## 2023-03-23 ENCOUNTER — Other Ambulatory Visit (HOSPITAL_BASED_OUTPATIENT_CLINIC_OR_DEPARTMENT_OTHER): Payer: Self-pay

## 2023-03-23 ENCOUNTER — Encounter (HOSPITAL_COMMUNITY): Payer: Self-pay | Admitting: Surgery

## 2023-03-23 ENCOUNTER — Other Ambulatory Visit (HOSPITAL_COMMUNITY): Payer: Self-pay

## 2023-03-23 ENCOUNTER — Ambulatory Visit (HOSPITAL_BASED_OUTPATIENT_CLINIC_OR_DEPARTMENT_OTHER): Payer: Medicare PPO

## 2023-03-23 ENCOUNTER — Encounter (HOSPITAL_COMMUNITY)
Admission: RE | Disposition: A | Discharge status: Home / Self Care | Payer: Self-pay | Source: Home / Self Care | Attending: Surgery

## 2023-03-23 ENCOUNTER — Ambulatory Visit (HOSPITAL_COMMUNITY): Payer: Medicare PPO | Admitting: Medical

## 2023-03-23 DIAGNOSIS — K4021 Bilateral inguinal hernia, without obstruction or gangrene, recurrent: Secondary | ICD-10-CM | POA: Insufficient documentation

## 2023-03-23 DIAGNOSIS — J45909 Unspecified asthma, uncomplicated: Secondary | ICD-10-CM | POA: Diagnosis not present

## 2023-03-23 DIAGNOSIS — G4733 Obstructive sleep apnea (adult) (pediatric): Secondary | ICD-10-CM

## 2023-03-23 DIAGNOSIS — Z7984 Long term (current) use of oral hypoglycemic drugs: Secondary | ICD-10-CM | POA: Diagnosis not present

## 2023-03-23 DIAGNOSIS — I2510 Atherosclerotic heart disease of native coronary artery without angina pectoris: Secondary | ICD-10-CM

## 2023-03-23 DIAGNOSIS — E119 Type 2 diabetes mellitus without complications: Secondary | ICD-10-CM | POA: Diagnosis not present

## 2023-03-23 DIAGNOSIS — Z7985 Long-term (current) use of injectable non-insulin antidiabetic drugs: Secondary | ICD-10-CM | POA: Diagnosis not present

## 2023-03-23 DIAGNOSIS — K4091 Unilateral inguinal hernia, without obstruction or gangrene, recurrent: Secondary | ICD-10-CM | POA: Diagnosis present

## 2023-03-23 DIAGNOSIS — N400 Enlarged prostate without lower urinary tract symptoms: Secondary | ICD-10-CM | POA: Insufficient documentation

## 2023-03-23 DIAGNOSIS — K4020 Bilateral inguinal hernia, without obstruction or gangrene, not specified as recurrent: Secondary | ICD-10-CM | POA: Diagnosis not present

## 2023-03-23 HISTORY — PX: INGUINAL HERNIA REPAIR: SHX194

## 2023-03-23 LAB — GLUCOSE, CAPILLARY
Glucose-Capillary: 102 mg/dL — ABNORMAL HIGH (ref 70–99)
Glucose-Capillary: 122 mg/dL — ABNORMAL HIGH (ref 70–99)

## 2023-03-23 SURGERY — REPAIR, HERNIA, INGUINAL, LAPAROSCOPIC
Anesthesia: General | Site: Abdomen | Laterality: Bilateral

## 2023-03-23 MED ORDER — MIDAZOLAM HCL 2 MG/2ML IJ SOLN
INTRAMUSCULAR | Status: AC
Start: 2023-03-23 — End: ?
  Filled 2023-03-23: qty 2

## 2023-03-23 MED ORDER — INSULIN ASPART 100 UNIT/ML IJ SOLN: 0.0000 [IU] | INTRAMUSCULAR | Status: DC | PRN

## 2023-03-23 MED ORDER — PROPOFOL 10 MG/ML IV BOLUS
INTRAVENOUS | Status: AC
  Filled 2023-03-23: qty 20

## 2023-03-23 MED ORDER — ACETAMINOPHEN 500 MG PO TABS
1000.0000 mg | ORAL_TABLET | ORAL | Status: AC
  Administered 2023-03-23: 1000 mg via ORAL
  Filled 2023-03-23: qty 2

## 2023-03-23 MED ORDER — DROPERIDOL 2.5 MG/ML IJ SOLN: 0.6250 mg | Freq: Once | INTRAMUSCULAR | Status: DC | PRN

## 2023-03-23 MED ORDER — CHLORHEXIDINE GLUCONATE CLOTH 2 % EX PADS: 6.0000 | MEDICATED_PAD | Freq: Once | CUTANEOUS | Status: DC

## 2023-03-23 MED ORDER — BUPIVACAINE LIPOSOME 1.3 % IJ SUSP: 20.0000 mL | Freq: Once | INTRAMUSCULAR | Status: DC

## 2023-03-23 MED ORDER — BUPIVACAINE-EPINEPHRINE (PF) 0.25% -1:200000 IJ SOLN
INTRAMUSCULAR | Status: DC | PRN
  Administered 2023-03-23: 30 mL

## 2023-03-23 MED ORDER — SUGAMMADEX SODIUM 200 MG/2ML IV SOLN
INTRAVENOUS | Status: DC | PRN
  Administered 2023-03-23: 200 mg via INTRAVENOUS

## 2023-03-23 MED ORDER — FENTANYL CITRATE (PF) 100 MCG/2ML IJ SOLN
INTRAMUSCULAR | Status: AC
  Filled 2023-03-23: qty 2

## 2023-03-23 MED ORDER — METHOCARBAMOL 750 MG PO TABS
750.0000 mg | ORAL_TABLET | Freq: Four times a day (QID) | ORAL | 0 refills | Status: AC | PRN
  Filled 2023-03-23: qty 30, 8d supply, fill #0

## 2023-03-23 MED ORDER — OXYCODONE-ACETAMINOPHEN 5-325 MG PO TABS
1.0000 | ORAL_TABLET | ORAL | 0 refills | Status: AC | PRN
  Filled 2023-03-23: qty 20, 4d supply, fill #0

## 2023-03-23 MED ORDER — EPHEDRINE SULFATE-NACL 50-0.9 MG/10ML-% IV SOSY
PREFILLED_SYRINGE | INTRAVENOUS | Status: DC | PRN
  Administered 2023-03-23 (×2): 5 mg via INTRAVENOUS

## 2023-03-23 MED ORDER — OXYCODONE HCL 5 MG PO TABS
ORAL_TABLET | ORAL | Status: AC
  Filled 2023-03-23: qty 1

## 2023-03-23 MED ORDER — DEXAMETHASONE SODIUM PHOSPHATE 10 MG/ML IJ SOLN
INTRAMUSCULAR | Status: DC | PRN
  Administered 2023-03-23: 8 mg via INTRAVENOUS

## 2023-03-23 MED ORDER — LIDOCAINE HCL (CARDIAC) PF 100 MG/5ML IV SOSY
PREFILLED_SYRINGE | INTRAVENOUS | Status: DC | PRN
  Administered 2023-03-23: 100 mg via INTRAVENOUS

## 2023-03-23 MED ORDER — DEXAMETHASONE SODIUM PHOSPHATE 10 MG/ML IJ SOLN
INTRAMUSCULAR | Status: AC
  Filled 2023-03-23: qty 1

## 2023-03-23 MED ORDER — PROPOFOL 10 MG/ML IV BOLUS
INTRAVENOUS | Status: DC | PRN
  Administered 2023-03-23: 130 mg via INTRAVENOUS

## 2023-03-23 MED ORDER — BUPIVACAINE LIPOSOME 1.3 % IJ SUSP
INTRAMUSCULAR | Status: AC
Start: 2023-03-23 — End: ?
  Filled 2023-03-23: qty 20

## 2023-03-23 MED ORDER — ONDANSETRON HCL 4 MG/2ML IJ SOLN
INTRAMUSCULAR | Status: DC | PRN
  Administered 2023-03-23: 4 mg via INTRAVENOUS

## 2023-03-23 MED ORDER — ROCURONIUM BROMIDE 100 MG/10ML IV SOLN
INTRAVENOUS | Status: DC | PRN
  Administered 2023-03-23: 50 mg via INTRAVENOUS
  Administered 2023-03-23: 20 mg via INTRAVENOUS

## 2023-03-23 MED ORDER — PROPOFOL 500 MG/50ML IV EMUL
INTRAVENOUS | Status: DC | PRN
  Administered 2023-03-23: 25 ug/kg/min via INTRAVENOUS

## 2023-03-23 MED ORDER — BUPIVACAINE LIPOSOME 1.3 % IJ SUSP
INTRAMUSCULAR | Status: DC | PRN
  Administered 2023-03-23: 20 mL

## 2023-03-23 MED ORDER — ACETAMINOPHEN 10 MG/ML IV SOLN: 1000.0000 mg | Freq: Once | INTRAVENOUS | Status: DC | PRN

## 2023-03-23 MED ORDER — FENTANYL CITRATE (PF) 100 MCG/2ML IJ SOLN
INTRAMUSCULAR | Status: DC | PRN
  Administered 2023-03-23: 50 ug via INTRAVENOUS
  Administered 2023-03-23: 25 ug via INTRAVENOUS
  Administered 2023-03-23: 50 ug via INTRAVENOUS

## 2023-03-23 MED ORDER — MIDAZOLAM HCL 5 MG/5ML IJ SOLN
INTRAMUSCULAR | Status: DC | PRN
  Administered 2023-03-23: 2 mg via INTRAVENOUS

## 2023-03-23 MED ORDER — FENTANYL CITRATE PF 50 MCG/ML IJ SOSY
PREFILLED_SYRINGE | INTRAMUSCULAR | Status: AC
  Filled 2023-03-23: qty 1

## 2023-03-23 MED ORDER — GABAPENTIN 300 MG PO CAPS
300.0000 mg | ORAL_CAPSULE | ORAL | Status: AC
  Administered 2023-03-23: 300 mg via ORAL
  Filled 2023-03-23: qty 1

## 2023-03-23 MED ORDER — CEFAZOLIN SODIUM-DEXTROSE 2-4 GM/100ML-% IV SOLN
2.0000 g | INTRAVENOUS | Status: AC
  Administered 2023-03-23: 2 g via INTRAVENOUS
  Filled 2023-03-23: qty 100

## 2023-03-23 MED ORDER — HEPARIN SODIUM (PORCINE) 5000 UNIT/ML IJ SOLN
5000.0000 [IU] | Freq: Once | INTRAMUSCULAR | Status: AC
  Administered 2023-03-23: 5000 [IU] via SUBCUTANEOUS
  Filled 2023-03-23: qty 1

## 2023-03-23 MED ORDER — OXYCODONE HCL 5 MG PO TABS
5.0000 mg | ORAL_TABLET | Freq: Once | ORAL | Status: AC | PRN
  Administered 2023-03-23: 5 mg via ORAL

## 2023-03-23 MED ORDER — OXYCODONE HCL 5 MG/5ML PO SOLN: 5.0000 mg | Freq: Once | ORAL | Status: AC | PRN

## 2023-03-23 MED ORDER — BUPIVACAINE HCL 0.25 % IJ SOLN
INTRAMUSCULAR | Status: AC
  Filled 2023-03-23: qty 1

## 2023-03-23 MED ORDER — ORAL CARE MOUTH RINSE: 15.0000 mL | Freq: Once | OROMUCOSAL | Status: AC

## 2023-03-23 MED ORDER — 0.9 % SODIUM CHLORIDE (POUR BTL) OPTIME
TOPICAL | Status: DC | PRN
  Administered 2023-03-23: 1000 mL

## 2023-03-23 MED ORDER — LACTATED RINGERS IV SOLN: INTRAVENOUS | Status: DC

## 2023-03-23 MED ORDER — ROCURONIUM BROMIDE 10 MG/ML (PF) SYRINGE
PREFILLED_SYRINGE | INTRAVENOUS | Status: AC
  Filled 2023-03-23: qty 10

## 2023-03-23 MED ORDER — MOUNJARO 2.5 MG/0.5ML ~~LOC~~ SOAJ
2.5000 mg | SUBCUTANEOUS | 0 refills | Status: AC
  Filled 2023-03-23: qty 2, 28d supply, fill #0

## 2023-03-23 MED ORDER — ONDANSETRON HCL 4 MG/2ML IJ SOLN
INTRAMUSCULAR | Status: AC
  Filled 2023-03-23: qty 2

## 2023-03-23 MED ORDER — CHLORHEXIDINE GLUCONATE 0.12 % MT SOLN
15.0000 mL | Freq: Once | OROMUCOSAL | Status: AC
  Administered 2023-03-23: 15 mL via OROMUCOSAL

## 2023-03-23 MED ORDER — LIDOCAINE HCL (PF) 2 % IJ SOLN
INTRAMUSCULAR | Status: AC
  Filled 2023-03-23: qty 5

## 2023-03-23 MED ORDER — FENTANYL CITRATE PF 50 MCG/ML IJ SOSY
25.0000 ug | PREFILLED_SYRINGE | INTRAMUSCULAR | Status: DC | PRN
  Administered 2023-03-23: 50 ug via INTRAVENOUS

## 2023-03-23 SURGICAL SUPPLY — 34 items
BAG COUNTER SPONGE SURGICOUNT (BAG) ×2 IMPLANT
CABLE HIGH FREQUENCY MONO STRZ (ELECTRODE) ×2 IMPLANT
CHLORAPREP W/TINT 26 (MISCELLANEOUS) ×2 IMPLANT
DERMABOND ADVANCED .7 DNX12 (GAUZE/BANDAGES/DRESSINGS) ×2 IMPLANT
ELECT REM PT RETURN 15FT ADLT (MISCELLANEOUS) ×2 IMPLANT
GLOVE BIO SURGEON STRL SZ7.5 (GLOVE) ×2 IMPLANT
GLOVE INDICATOR 8.0 STRL GRN (GLOVE) ×2 IMPLANT
GOWN STRL REUS W/ TWL XL LVL3 (GOWN DISPOSABLE) ×2 IMPLANT
GRASPER SUT TROCAR 14GX15 (MISCELLANEOUS) ×2 IMPLANT
IRRIG SUCT STRYKERFLOW 2 WTIP (MISCELLANEOUS)
IRRIGATION SUCT STRKRFLW 2 WTP (MISCELLANEOUS) IMPLANT
KIT BASIN OR (CUSTOM PROCEDURE TRAY) ×2 IMPLANT
KIT TURNOVER KIT A (KITS) IMPLANT
MARKER SKIN DUAL TIP RULER LAB (MISCELLANEOUS) ×2 IMPLANT
MESH 3DMAX 5X7 LT XLRG (Mesh General) IMPLANT
MESH 3DMAX 5X7 RT XLRG (Mesh General) IMPLANT
NDL INSUFFLATION 14GA 120MM (NEEDLE) ×2 IMPLANT
NEEDLE INSUFFLATION 14GA 120MM (NEEDLE) ×1
RELOAD STAPLE 4.0 BLU F/HERNIA (INSTRUMENTS) ×2 IMPLANT
RELOAD STAPLE 4.8 BLK F/HERNIA (STAPLE) IMPLANT
RELOAD STAPLE HERNIA 4.0 BLUE (INSTRUMENTS) ×1
RELOAD STAPLE HERNIA 4.8 BLK (STAPLE) ×2
SCISSORS LAP 5X35 DISP (ENDOMECHANICALS) ×2 IMPLANT
SET TUBE SMOKE EVAC HIGH FLOW (TUBING) ×2 IMPLANT
SLEEVE Z-THREAD 5X100MM (TROCAR) ×2 IMPLANT
SPIKE FLUID TRANSFER (MISCELLANEOUS) IMPLANT
STAPLER HERNIA 12 8.5 360D (INSTRUMENTS) ×2 IMPLANT
SUT MNCRL AB 4-0 PS2 18 (SUTURE) ×2 IMPLANT
SUT VICRYL 0 UR6 27IN ABS (SUTURE) IMPLANT
TOWEL OR 17X26 10 PK STRL BLUE (TOWEL DISPOSABLE) ×2 IMPLANT
TRAY FOL W/BAG SLVR 16FR STRL (SET/KITS/TRAYS/PACK) IMPLANT
TRAY LAPAROSCOPIC (CUSTOM PROCEDURE TRAY) ×2 IMPLANT
TROCAR ADV FIXATION 12X100MM (TROCAR) ×2 IMPLANT
TROCAR Z-THREAD BLADED 5X100MM (TROCAR) ×2 IMPLANT

## 2023-03-23 NOTE — Discharge Instructions (Signed)

## 2023-03-23 NOTE — Op Note (Signed)
 Patient: Corey Ward (11-24-1950, [id number])  Date of Surgery: 03/23/2023  Preoperative Diagnosis: recurrent right and initial left inguinal hernias   Postoperative Diagnosis: recurrent right and initial left inguinal hernias   Surgical Procedure: LAPAROSCOPIC BILATERAL INGUINAL HERNIA REPAIR WITH MESH: [id number]   Operative Team Members:  Surgeons and Role:    * Takyra Cantrall, Corey PARAS, MD - Primary   Anesthesiologist: Corinne Garnette BRAVO, MD CRNA: Memory Armida LABOR, CRNA; Dartha Meckel, CRNA   Anesthesia: General   Fluids:  Total I/O In: 635.4 [I.V.:535.4; IV Piggyback:100] Out: 10 [Blood:10]  Complications: None  Drains:  None  Specimen: None  Disposition:  PACU - hemodynamically stable.  Plan of Care: Discharge to home after PACU  Indications for Procedure: Corey Ward is an 73 y.o. male with recurrent right inguinal hernia and left inguinal hernia.   I recommended bilateral inguinal hernia repair with mesh. We discussed the procedure itself as well as its risk, benefits, and alternatives. After full discussion all questions answered the patient granted consent to proceed.    Findings:  Technique: Transabdominal preperitoneal (TAPP) Hernia Location: Large indirect right inguinal hernia, small indirect left inguinal hernia Mesh Size &Type:  Bard 3 D max extra large right and left sided mesh Mesh Fixation: Endo-Universal hernia stapler  Infection status: Patient: Private Patient Elective Case Case: Elective Infection Present At Time Of Surgery (PATOS): None   Description of Procedure:  The patient was positioned supine, padded and secured to the bed, with both arms tucked.  The abdomen was widely prepped and draped.  A time out procedure was performed.  A 1 cm infraumbilical incision was made.  The abdomen was entered without trauma to the underlying viscera.  The abdomen was insufflated to 15 mm of Hg.  A 12 mm trocar was inserted at the periumbilical  incision.  Additional 5 mm trocars were placed in the left and right abdomen.  There was no trauma to the underlying viscera.  There was a large indirect hernia on the RIGHT.  Utilizing a transabdominal pre peritoneal technique (TAPP), a horizontal incision was made in the peritoneum, immediately below the umbilicus.  Dissection was carried out in the pre peritoneal space down to the level of the hernia sac which was reduced into the peritoneal cavity completely.  The cord contents were parietalized and preserved.  A large pre peritoneal dissection was performed to uncover the direct, indirect, femoral and obturator spaces.  Cooper's ligament was uncovered medially and the psoas muscle uncovered laterally.  The mesh, as documented above, was opened and advanced into the pre peritoneal position so that it more than adequately covered the indirect, direct, femoral and obturator spaces.  The mesh laid flat, with no inferior folds and covered the entire myopectineal orifice.  The mesh was fixated with the endo-universal hernia stapler to Cooper's ligament and the posterior aspect of the rectus muscle.  The peritoneal flap was closed with the same device.  There were no peritoneal defects or exposed mesh at the conclusion.  There was an indirect hernia on the LEFT.  Utilizing a transabdominal pre peritoneal technique (TAPP), a horizontal incision was made in the peritoneum, immediately below the umbilicus.  Dissection was carried out in the pre peritoneal space down to the level of the hernia sac which was reduced into the peritoneal cavity completely.  The cord contents were parietalized and preserved.  A large pre peritoneal dissection was performed to uncover the direct, indirect, femoral and obturator spaces.  Cooper's ligament  was uncovered medially and the psoas muscle uncovered laterally.  The mesh, as documented above, was opened and advanced into the pre peritoneal position so that it more than  adequately covered the indirect, direct, femoral and obturator spaces.  The mesh laid flat, with no inferior folds and covered the entire myopectineal orifice.  The mesh was fixated with the endo-universal hernia stapler to Cooper's ligament and the posterior aspect of the rectus muscle.  The peritoneal flap was closed with the same device.  There were no peritoneal defects or exposed mesh at the conclusion.  The umbilical trocar was removed and the fascial defect was closed with a 0 Vicryl suture.  The peritoneal cavity was completely desufflated, the trocars removed and the skin closed with 4-0 Monocryl subcuticular suture and skin glue.  All sponge and needle counts were correct at the end of the case.  Corey Foy, MD General, Bariatric, & Minimally Invasive Surgery [facility], [place]

## 2023-03-23 NOTE — Anesthesia Procedure Notes (Signed)
 Procedure Name: Intubation Date/Time: 03/23/2023 11:43 AM  Performed by: Memory Armida LABOR, CRNAPre-anesthesia Checklist: Patient identified, Emergency Drugs available, Suction available, Patient being monitored and Timeout performed Patient Re-evaluated:Patient Re-evaluated prior to induction Oxygen Delivery Method: Circle system utilized Preoxygenation: Pre-oxygenation with 100% oxygen Induction Type: IV induction Ventilation: Mask ventilation without difficulty Laryngoscope Size: Mac and 4 Grade View: Grade II Tube type: Oral Tube size: 7.5 mm Number of attempts: 1 Airway Equipment and Method: Stylet Placement Confirmation: ETT inserted through vocal cords under direct vision, positive ETCO2 and breath sounds checked- equal and bilateral Secured at: 22 cm Tube secured with: Tape Dental Injury: Teeth and Oropharynx as per pre-operative assessment

## 2023-03-23 NOTE — Transfer of Care (Signed)
 Immediate Anesthesia Transfer of Care Note  Patient: Corey Ward  Procedure(s) Performed: LAPAROSCOPIC BILATERAL INGUINAL HERNIA REPAIR WITH MESH (Bilateral: Abdomen)  Patient Location: PACU  Anesthesia Type:General  Level of Consciousness: awake, alert , oriented, and patient cooperative  Airway & Oxygen Therapy: Patient Spontanous Breathing and Patient connected to face mask oxygen  Post-op Assessment: Report given to RN, Post -op Vital signs reviewed and stable, and Patient moving all extremities  Post vital signs: Reviewed and stable  Last Vitals:  Vitals Value Taken Time  BP 125/71 03/23/23 1330  Temp    Pulse 60 03/23/23 1330  Resp 10 03/23/23 1330  SpO2 100 % 03/23/23 1330    Last Pain:  Vitals:   03/23/23 1033  TempSrc: Oral  PainSc:          Complications: No notable events documented.

## 2023-03-23 NOTE — H&P (Signed)
 Admitting Physician: Deward PARAS Jovani Colquhoun  Service: General Surgery  CC: Inguinal hernias  Subjective   HPI: Corey Ward is an 73 y.o. male who is here for inguinal hernia repair  Past Medical History:  Diagnosis Date   Arthritis    Asthma    When younger no problem now   BPH (benign prostatic hyperplasia)    Diabetes mellitus without complication (HCC)    Gout    Hyperlipidemia    OSA (obstructive sleep apnea)    PONV (postoperative nausea and vomiting)    Sleep apnea    Urinary retention     Past Surgical History:  Procedure Laterality Date   calcium deposits removed from ankle     Left   CARPAL TUNNEL RELEASE Right 12/21/2020   Procedure: RIGHT CARPAL TUNNEL RELEASE;  Surgeon: Murrell Kuba, MD;  Location: [facility];  Service: Orthopedics;  Laterality: Right;   INGUINAL HERNIA REPAIR Right    KNEE SURGERY     arthroscopic  surg   REPLACEMENT TOTAL KNEE Bilateral    TENDON REPAIR Right    foot   TONSILLECTOMY     TOTAL HIP ARTHROPLASTY Right 09/08/2018   Procedure: TOTAL HIP ARTHROPLASTY ANTERIOR APPROACH;  Surgeon: Melodi Lerner, MD;  Location: [facility];  Service: Orthopedics;  Laterality: Right;     Family History  Problem Relation Age of Onset   Heart disease Father    Colon cancer Neg Hx    Esophageal cancer Neg Hx    Rectal cancer Neg Hx    Stomach cancer Neg Hx     Social:  reports that he has never smoked. He has never used smokeless tobacco. He reports that he does not drink alcohol and does not use drugs.  Allergies: No Known Allergies  Medications: Current Outpatient Medications  Medication Instructions   allopurinol  (ZYLOPRIM ) 300 mg, Daily at bedtime   CANNABIDIOL PO 1-2 tablets, At bedtime PRN   colchicine  0.6 mg, Daily PRN   ezetimibe -simvastatin  (VYTORIN ) 10-40 MG per tablet 1 tablet, Daily at bedtime   finasteride  (PROSCAR ) 5 mg, Every evening   MELATONIN PO 1 tablet, Oral, At bedtime PRN   metFORMIN  (GLUCOPHAGE) 500 mg, Oral, 2 times daily with meals   Mounjaro  7.5 mg, Subcutaneous, Every 7 days   Mounjaro  2.5 mg, Subcutaneous, Weekly   sildenafil (REVATIO) 60-100 mg, Oral, Daily PRN   tamsulosin  (FLOMAX ) 0.4 mg, 2 times daily    ROS - all of the below systems have been reviewed with the patient and positives are indicated with bold text General: chills, fever or night sweats Eyes: blurry vision or double vision ENT: epistaxis or sore throat Allergy/Immunology: itchy/watery eyes or nasal congestion Hematologic/Lymphatic: bleeding problems, blood clots or swollen lymph nodes Endocrine: temperature intolerance or unexpected weight changes Breast: new or changing breast lumps or nipple discharge Resp: cough, shortness of breath, or wheezing CV: chest pain or dyspnea on exertion GI: as per HPI GU: dysuria, trouble voiding, or hematuria MSK: joint pain or joint stiffness Neuro: TIA or stroke symptoms Derm: pruritus and skin lesion changes Psych: anxiety and depression  Objective   PE Blood pressure 124/72, pulse (!) 54, temperature 98.6 F (37 C), temperature source Oral, resp. rate 18, height 6' 4 (1.93 m), weight 104.3 kg, SpO2 98%. Constitutional: NAD; conversant; no deformities Eyes: Moist conjunctiva; no lid lag; anicteric; PERRL Neck: Trachea midline; no thyromegaly Lungs: Normal respiratory effort; no tactile fremitus CV: RRR; no palpable thrills; no pitting edema  GI: Abd Soft, nontender, bilateral inguinal hernias; no palpable hepatosplenomegaly MSK: Normal range of motion of extremities; no clubbing/cyanosis Psychiatric: Appropriate affect; alert and oriented x3 Lymphatic: No palpable cervical or axillary lymphadenopathy  Results for orders placed or performed during the hospital encounter of 03/23/23 (from the past 24 hours)  Glucose, capillary     Status: Abnormal   Collection Time: 03/23/23 10:23 AM  Result Value Ref Range   Glucose-Capillary 102 (H) 70 - 99  mg/dL    Imaging Orders  No imaging studies ordered today     Assessment and Plan   Corey Ward is an 73 y.o. male with recurrent right inguinal hernia and left inguinal hernia.   I recommended bilateral inguinal hernia repair with mesh. We discussed the procedure itself as well as its risk, benefits, and alternatives. After full discussion all questions answered the patient granted consent to proceed.     ICD-10-CM   1. Type 2 diabetes mellitus treated without insulin  (HCC)  E11.9 CBG per Guidelines for Diabetes Management for Patients Undergoing Surgery ([facility], [facility], and [facility] only)    CBG per Guidelines for Diabetes Management for Patients Undergoing Surgery ([facility], [facility], and [facility] only)       Deward JINNY Foy, MD  [facility] Use [email or web address] to contact on call provider

## 2023-03-24 ENCOUNTER — Encounter (HOSPITAL_COMMUNITY): Payer: Self-pay | Admitting: Surgery

## 2023-03-24 NOTE — Anesthesia Postprocedure Evaluation (Signed)
 Anesthesia Post Note  Patient: Corey Ward  Procedure(s) Performed: LAPAROSCOPIC BILATERAL INGUINAL HERNIA REPAIR WITH MESH (Bilateral: Abdomen)     Patient location during evaluation: PACU Anesthesia Type: General Level of consciousness: awake and alert Pain management: pain level controlled Vital Signs Assessment: post-procedure vital signs reviewed and stable Respiratory status: spontaneous breathing, nonlabored ventilation and respiratory function stable Cardiovascular status: blood pressure returned to baseline and stable Postop Assessment: no apparent nausea or vomiting Anesthetic complications: no   No notable events documented.  Last Vitals:  Vitals:   03/23/23 1430 03/23/23 1452  BP: 109/66 120/70  Pulse: (!) 49 (!) 48  Resp: 10 14  Temp:  (!) 36.4 C  SpO2: 96% 99%    Last Pain:  Vitals:   03/23/23 1452  TempSrc: Oral  PainSc: 2                  Garnette FORBES Skillern

## 2023-03-26 DIAGNOSIS — G4733 Obstructive sleep apnea (adult) (pediatric): Secondary | ICD-10-CM | POA: Diagnosis not present

## 2023-03-27 DIAGNOSIS — E291 Testicular hypofunction: Secondary | ICD-10-CM | POA: Diagnosis not present

## 2023-03-27 DIAGNOSIS — E785 Hyperlipidemia, unspecified: Secondary | ICD-10-CM | POA: Diagnosis not present

## 2023-03-27 DIAGNOSIS — I2510 Atherosclerotic heart disease of native coronary artery without angina pectoris: Secondary | ICD-10-CM | POA: Diagnosis not present

## 2023-03-27 DIAGNOSIS — E1169 Type 2 diabetes mellitus with other specified complication: Secondary | ICD-10-CM | POA: Diagnosis not present

## 2023-03-27 DIAGNOSIS — N401 Enlarged prostate with lower urinary tract symptoms: Secondary | ICD-10-CM | POA: Diagnosis not present

## 2023-03-30 DIAGNOSIS — N401 Enlarged prostate with lower urinary tract symptoms: Secondary | ICD-10-CM | POA: Diagnosis not present

## 2023-03-30 DIAGNOSIS — R3912 Poor urinary stream: Secondary | ICD-10-CM | POA: Diagnosis not present

## 2023-04-02 DIAGNOSIS — E785 Hyperlipidemia, unspecified: Secondary | ICD-10-CM | POA: Diagnosis not present

## 2023-04-02 DIAGNOSIS — E1169 Type 2 diabetes mellitus with other specified complication: Secondary | ICD-10-CM | POA: Diagnosis not present

## 2023-04-02 DIAGNOSIS — E790 Hyperuricemia without signs of inflammatory arthritis and tophaceous disease: Secondary | ICD-10-CM | POA: Diagnosis not present

## 2023-04-02 DIAGNOSIS — E538 Deficiency of other specified B group vitamins: Secondary | ICD-10-CM | POA: Diagnosis not present

## 2023-04-02 DIAGNOSIS — I2510 Atherosclerotic heart disease of native coronary artery without angina pectoris: Secondary | ICD-10-CM | POA: Diagnosis not present

## 2023-04-02 DIAGNOSIS — M353 Polymyalgia rheumatica: Secondary | ICD-10-CM | POA: Diagnosis not present

## 2023-04-02 DIAGNOSIS — M109 Gout, unspecified: Secondary | ICD-10-CM | POA: Diagnosis not present

## 2023-04-02 DIAGNOSIS — K635 Polyp of colon: Secondary | ICD-10-CM | POA: Diagnosis not present

## 2023-04-02 DIAGNOSIS — Z0000 Encounter for general adult medical examination without abnormal findings: Secondary | ICD-10-CM | POA: Diagnosis not present

## 2023-04-02 DIAGNOSIS — E119 Type 2 diabetes mellitus without complications: Secondary | ICD-10-CM | POA: Diagnosis not present

## 2023-04-02 DIAGNOSIS — E669 Obesity, unspecified: Secondary | ICD-10-CM | POA: Diagnosis not present

## 2023-04-17 ENCOUNTER — Emergency Department (HOSPITAL_COMMUNITY): Payer: Medicare PPO

## 2023-04-17 ENCOUNTER — Encounter (HOSPITAL_COMMUNITY): Payer: Self-pay

## 2023-04-17 ENCOUNTER — Other Ambulatory Visit: Payer: Self-pay

## 2023-04-17 ENCOUNTER — Inpatient Hospital Stay (HOSPITAL_COMMUNITY)
Admission: EM | Admit: 2023-04-17 | Discharge: 2023-04-20 | DRG: 872 | Disposition: A | Discharge status: Home / Self Care | Payer: Medicare PPO | Attending: Internal Medicine | Admitting: Internal Medicine

## 2023-04-17 DIAGNOSIS — N433 Hydrocele, unspecified: Secondary | ICD-10-CM | POA: Diagnosis not present

## 2023-04-17 DIAGNOSIS — E861 Hypovolemia: Secondary | ICD-10-CM | POA: Diagnosis not present

## 2023-04-17 DIAGNOSIS — D649 Anemia, unspecified: Secondary | ICD-10-CM | POA: Diagnosis present

## 2023-04-17 DIAGNOSIS — G9519 Other vascular myelopathies: Secondary | ICD-10-CM | POA: Diagnosis not present

## 2023-04-17 DIAGNOSIS — Z8249 Family history of ischemic heart disease and other diseases of the circulatory system: Secondary | ICD-10-CM

## 2023-04-17 DIAGNOSIS — E871 Hypo-osmolality and hyponatremia: Secondary | ICD-10-CM | POA: Diagnosis present

## 2023-04-17 DIAGNOSIS — R509 Fever, unspecified: Secondary | ICD-10-CM | POA: Diagnosis not present

## 2023-04-17 DIAGNOSIS — T50905A Adverse effect of unspecified drugs, medicaments and biological substances, initial encounter: Secondary | ICD-10-CM | POA: Diagnosis present

## 2023-04-17 DIAGNOSIS — N451 Epididymitis: Secondary | ICD-10-CM | POA: Insufficient documentation

## 2023-04-17 DIAGNOSIS — Z79899 Other long term (current) drug therapy: Secondary | ICD-10-CM

## 2023-04-17 DIAGNOSIS — Z7984 Long term (current) use of oral hypoglycemic drugs: Secondary | ICD-10-CM | POA: Diagnosis not present

## 2023-04-17 DIAGNOSIS — R9431 Abnormal electrocardiogram [ECG] [EKG]: Secondary | ICD-10-CM | POA: Diagnosis not present

## 2023-04-17 DIAGNOSIS — Z96653 Presence of artificial knee joint, bilateral: Secondary | ICD-10-CM | POA: Diagnosis present

## 2023-04-17 DIAGNOSIS — Z96641 Presence of right artificial hip joint: Secondary | ICD-10-CM | POA: Diagnosis present

## 2023-04-17 DIAGNOSIS — R6520 Severe sepsis without septic shock: Secondary | ICD-10-CM | POA: Diagnosis not present

## 2023-04-17 DIAGNOSIS — N453 Epididymo-orchitis: Secondary | ICD-10-CM | POA: Diagnosis not present

## 2023-04-17 DIAGNOSIS — N400 Enlarged prostate without lower urinary tract symptoms: Secondary | ICD-10-CM | POA: Diagnosis present

## 2023-04-17 DIAGNOSIS — M109 Gout, unspecified: Secondary | ICD-10-CM | POA: Diagnosis present

## 2023-04-17 DIAGNOSIS — N492 Inflammatory disorders of scrotum: Secondary | ICD-10-CM | POA: Diagnosis not present

## 2023-04-17 DIAGNOSIS — Z7985 Long-term (current) use of injectable non-insulin antidiabetic drugs: Secondary | ICD-10-CM | POA: Diagnosis not present

## 2023-04-17 DIAGNOSIS — N50819 Testicular pain, unspecified: Secondary | ICD-10-CM | POA: Diagnosis not present

## 2023-04-17 DIAGNOSIS — E785 Hyperlipidemia, unspecified: Secondary | ICD-10-CM | POA: Diagnosis present

## 2023-04-17 DIAGNOSIS — N179 Acute kidney failure, unspecified: Secondary | ICD-10-CM | POA: Diagnosis not present

## 2023-04-17 DIAGNOSIS — E876 Hypokalemia: Secondary | ICD-10-CM | POA: Diagnosis not present

## 2023-04-17 DIAGNOSIS — R609 Edema, unspecified: Secondary | ICD-10-CM | POA: Diagnosis not present

## 2023-04-17 DIAGNOSIS — E119 Type 2 diabetes mellitus without complications: Secondary | ICD-10-CM | POA: Diagnosis not present

## 2023-04-17 DIAGNOSIS — K4090 Unilateral inguinal hernia, without obstruction or gangrene, not specified as recurrent: Secondary | ICD-10-CM | POA: Diagnosis not present

## 2023-04-17 DIAGNOSIS — A419 Sepsis, unspecified organism: Principal | ICD-10-CM | POA: Insufficient documentation

## 2023-04-17 DIAGNOSIS — R17 Unspecified jaundice: Secondary | ICD-10-CM | POA: Diagnosis not present

## 2023-04-17 DIAGNOSIS — R42 Dizziness and giddiness: Secondary | ICD-10-CM | POA: Diagnosis not present

## 2023-04-17 LAB — COMPREHENSIVE METABOLIC PANEL
ALT: 10 U/L (ref 0–44)
AST: 14 U/L — ABNORMAL LOW (ref 15–41)
Albumin: 4.2 g/dL (ref 3.5–5.0)
Alkaline Phosphatase: 67 U/L (ref 38–126)
Anion gap: 13 (ref 5–15)
BUN: 18 mg/dL (ref 8–23)
CO2: 19 mmol/L — ABNORMAL LOW (ref 22–32)
Calcium: 9 mg/dL (ref 8.9–10.3)
Chloride: 102 mmol/L (ref 98–111)
Creatinine, Ser: 1 mg/dL (ref 0.61–1.24)
GFR, Estimated: >60 mL/min (ref >60)
Glucose, Bld: 151 mg/dL — ABNORMAL HIGH (ref 70–99)
Potassium: 3.3 mmol/L — ABNORMAL LOW (ref 3.5–5.1)
Sodium: 134 mmol/L — ABNORMAL LOW (ref 135–145)
Total Bilirubin: 1.3 mg/dL — ABNORMAL HIGH (ref 0.0–1.2)
Total Protein: 7.5 g/dL (ref 6.5–8.1)

## 2023-04-17 LAB — URINALYSIS, ROUTINE W REFLEX MICROSCOPIC
Bacteria, UA: NONE SEEN
Bilirubin Urine: NEGATIVE
Glucose, UA: NEGATIVE mg/dL
Ketones, ur: 20 mg/dL — AB
Leukocytes,Ua: NEGATIVE
Nitrite: NEGATIVE
Protein, ur: NEGATIVE mg/dL
Specific Gravity, Urine: 1.017 (ref 1.005–1.030)
pH: 5 (ref 5.0–8.0)

## 2023-04-17 LAB — CBC
HCT: 38.6 % — ABNORMAL LOW (ref 39.0–52.0)
Hemoglobin: 12.4 g/dL — ABNORMAL LOW (ref 13.0–17.0)
MCH: 29.5 pg (ref 26.0–34.0)
MCHC: 32.1 g/dL (ref 30.0–36.0)
MCV: 91.9 fL (ref 80.0–100.0)
Platelets: 247 10*3/uL (ref 150–400)
RBC: 4.2 MIL/uL — ABNORMAL LOW (ref 4.22–5.81)
RDW: 13.9 % (ref 11.5–15.5)
WBC: 20.8 10*3/uL — ABNORMAL HIGH (ref 4.0–10.5)
nRBC: 0 % (ref 0.0–0.2)

## 2023-04-17 LAB — PROTIME-INR
INR: 1.2 (ref 0.8–1.2)
Prothrombin Time: 15.1 s (ref 11.4–15.2)

## 2023-04-17 LAB — GLUCOSE, CAPILLARY: Glucose-Capillary: 137 mg/dL — ABNORMAL HIGH (ref 70–99)

## 2023-04-17 LAB — I-STAT CG4 LACTIC ACID, ED: Lactic Acid, Venous: 1.7 mmol/L (ref 0.5–1.9)

## 2023-04-17 MED ORDER — LACTATED RINGERS IV BOLUS
2000.0000 mL | Freq: Once | INTRAVENOUS | Status: AC
  Administered 2023-04-17: 2000 mL via INTRAVENOUS

## 2023-04-17 MED ORDER — POLYETHYLENE GLYCOL 3350 17 G PO PACK
17.0000 g | PACK | Freq: Two times a day (BID) | ORAL | Status: AC
  Administered 2023-04-18: 17 g via ORAL
  Filled 2023-04-17 (×3): qty 1

## 2023-04-17 MED ORDER — VANCOMYCIN HCL 2000 MG/400ML IV SOLN
2000.0000 mg | Freq: Once | INTRAVENOUS | Status: AC
  Administered 2023-04-17: 2000 mg via INTRAVENOUS
  Filled 2023-04-17: qty 400

## 2023-04-17 MED ORDER — ONDANSETRON HCL 4 MG PO TABS: 4.0000 mg | ORAL_TABLET | Freq: Four times a day (QID) | ORAL | Status: DC | PRN

## 2023-04-17 MED ORDER — MORPHINE SULFATE (PF) 2 MG/ML IV SOLN: 2.0000 mg | INTRAVENOUS | Status: DC | PRN

## 2023-04-17 MED ORDER — INSULIN ASPART 100 UNIT/ML IJ SOLN
3.0000 [IU] | Freq: Three times a day (TID) | INTRAMUSCULAR | Status: DC
  Administered 2023-04-18 – 2023-04-20 (×8): 3 [IU] via SUBCUTANEOUS

## 2023-04-17 MED ORDER — ONDANSETRON HCL 4 MG/2ML IJ SOLN: 4.0000 mg | Freq: Four times a day (QID) | INTRAMUSCULAR | Status: DC | PRN

## 2023-04-17 MED ORDER — INSULIN ASPART 100 UNIT/ML IJ SOLN
0.0000 [IU] | Freq: Three times a day (TID) | INTRAMUSCULAR | Status: DC
  Administered 2023-04-18: 2 [IU] via SUBCUTANEOUS
  Administered 2023-04-19: 3 [IU] via SUBCUTANEOUS
  Administered 2023-04-20: 2 [IU] via SUBCUTANEOUS

## 2023-04-17 MED ORDER — TAMSULOSIN HCL 0.4 MG PO CAPS
0.4000 mg | ORAL_CAPSULE | Freq: Every day | ORAL | Status: DC
  Administered 2023-04-18 – 2023-04-19 (×2): 0.4 mg via ORAL
  Filled 2023-04-17 (×2): qty 1

## 2023-04-17 MED ORDER — VANCOMYCIN HCL 1250 MG/250ML IV SOLN
1250.0000 mg | Freq: Two times a day (BID) | INTRAVENOUS | Status: DC
  Administered 2023-04-18 – 2023-04-19 (×3): 1250 mg via INTRAVENOUS
  Filled 2023-04-17 (×4): qty 250

## 2023-04-17 MED ORDER — ALLOPURINOL 300 MG PO TABS
300.0000 mg | ORAL_TABLET | Freq: Every day | ORAL | Status: DC
  Administered 2023-04-17 – 2023-04-19 (×3): 300 mg via ORAL
  Filled 2023-04-17 (×3): qty 1

## 2023-04-17 MED ORDER — INSULIN ASPART 100 UNIT/ML IJ SOLN: 0.0000 [IU] | Freq: Every day | INTRAMUSCULAR | Status: DC

## 2023-04-17 MED ORDER — ENOXAPARIN SODIUM 40 MG/0.4ML IJ SOSY
40.0000 mg | PREFILLED_SYRINGE | INTRAMUSCULAR | Status: DC
  Administered 2023-04-17 – 2023-04-19 (×3): 40 mg via SUBCUTANEOUS
  Filled 2023-04-17 (×3): qty 0.4

## 2023-04-17 MED ORDER — SODIUM CHLORIDE 0.9 % IV SOLN: 250.0000 mL | INTRAVENOUS | Status: AC | PRN

## 2023-04-17 MED ORDER — POTASSIUM CHLORIDE CRYS ER 20 MEQ PO TBCR
40.0000 meq | EXTENDED_RELEASE_TABLET | Freq: Two times a day (BID) | ORAL | Status: AC
  Administered 2023-04-17 – 2023-04-18 (×2): 40 meq via ORAL
  Filled 2023-04-17 (×2): qty 2

## 2023-04-17 MED ORDER — FINASTERIDE 5 MG PO TABS
5.0000 mg | ORAL_TABLET | Freq: Every evening | ORAL | Status: DC
Start: 2023-04-17 — End: 2023-04-20
  Administered 2023-04-17 – 2023-04-19 (×3): 5 mg via ORAL
  Filled 2023-04-17 (×3): qty 1

## 2023-04-17 MED ORDER — SODIUM CHLORIDE 0.9 % IV SOLN
2.0000 g | Freq: Once | INTRAVENOUS | Status: AC
  Administered 2023-04-17: 2 g via INTRAVENOUS
  Filled 2023-04-17: qty 20

## 2023-04-17 MED ORDER — SODIUM CHLORIDE 0.9 % IV SOLN
2.0000 g | INTRAVENOUS | Status: DC
  Administered 2023-04-18 – 2023-04-20 (×3): 2 g via INTRAVENOUS
  Filled 2023-04-17 (×3): qty 20

## 2023-04-17 MED ORDER — SODIUM CHLORIDE 0.9% FLUSH: 3.0000 mL | INTRAVENOUS | Status: DC | PRN

## 2023-04-17 MED ORDER — IOHEXOL 300 MG/ML  SOLN
100.0000 mL | Freq: Once | INTRAMUSCULAR | Status: AC | PRN
  Administered 2023-04-17: 100 mL via INTRAVENOUS

## 2023-04-17 MED ORDER — SIMVASTATIN 40 MG PO TABS
40.0000 mg | ORAL_TABLET | Freq: Every day | ORAL | Status: DC
  Administered 2023-04-17 – 2023-04-19 (×3): 40 mg via ORAL
  Filled 2023-04-17 (×3): qty 1

## 2023-04-17 MED ORDER — INSULIN GLARGINE-YFGN 100 UNIT/ML ~~LOC~~ SOLN
5.0000 [IU] | Freq: Every day | SUBCUTANEOUS | Status: DC
  Administered 2023-04-17 – 2023-04-19 (×3): 5 [IU] via SUBCUTANEOUS
  Filled 2023-04-17 (×4): qty 0.05

## 2023-04-17 MED ORDER — METRONIDAZOLE 500 MG/100ML IV SOLN
500.0000 mg | Freq: Two times a day (BID) | INTRAVENOUS | Status: DC
  Administered 2023-04-17 – 2023-04-19 (×4): 500 mg via INTRAVENOUS
  Filled 2023-04-17 (×4): qty 100

## 2023-04-17 MED ORDER — SODIUM CHLORIDE 0.9% FLUSH
3.0000 mL | Freq: Two times a day (BID) | INTRAVENOUS | Status: DC
  Administered 2023-04-17 – 2023-04-20 (×3): 3 mL via INTRAVENOUS

## 2023-04-17 MED ORDER — SODIUM CHLORIDE 0.9 % IV BOLUS
1000.0000 mL | Freq: Once | INTRAVENOUS | Status: AC
  Administered 2023-04-17: 1000 mL via INTRAVENOUS

## 2023-04-17 MED ORDER — ACETAMINOPHEN 325 MG PO TABS
650.0000 mg | ORAL_TABLET | Freq: Four times a day (QID) | ORAL | Status: DC | PRN
  Administered 2023-04-17 – 2023-04-18 (×3): 650 mg via ORAL
  Filled 2023-04-17 (×3): qty 2

## 2023-04-17 MED ORDER — IOHEXOL 300 MG/ML  SOLN
30.0000 mL | Freq: Once | INTRAMUSCULAR | Status: AC | PRN
  Administered 2023-04-17: 30 mL via ORAL

## 2023-04-17 MED ORDER — OXYCODONE-ACETAMINOPHEN 5-325 MG PO TABS: 1.0000 | ORAL_TABLET | ORAL | Status: DC | PRN

## 2023-04-17 MED ORDER — EZETIMIBE-SIMVASTATIN 10-40 MG PO TABS: 1.0000 | ORAL_TABLET | Freq: Every day | ORAL | Status: DC

## 2023-04-17 MED ORDER — EZETIMIBE 10 MG PO TABS
10.0000 mg | ORAL_TABLET | Freq: Every day | ORAL | Status: DC
  Administered 2023-04-17 – 2023-04-19 (×3): 10 mg via ORAL
  Filled 2023-04-17 (×3): qty 1

## 2023-04-17 MED ORDER — ACETAMINOPHEN 325 MG PO TABS
650.0000 mg | ORAL_TABLET | Freq: Once | ORAL | Status: AC
Start: 2023-04-17 — End: 2023-04-17
  Administered 2023-04-17: 650 mg via ORAL
  Filled 2023-04-17: qty 2

## 2023-04-17 MED ORDER — ACETAMINOPHEN 650 MG RE SUPP: 650.0000 mg | Freq: Four times a day (QID) | RECTAL | Status: DC | PRN

## 2023-04-17 NOTE — Progress Notes (Signed)
Pharmacy Antibiotic Note  Corey Ward is a 73 y.o. male s/p bilateral inguinal hernia repair on 03/23/23 who presented to the ED on 04/17/2023 with c/o left scrotal pain and swelling. Abdominal CT and ultrasound of scrotum showed findings with concern for epididymitis and scrotal cellulitis.  Pharmacy has been consulted to dose vancomycin for infection.   Today, 04/17/2023: - scr 1 (crcl~ 91) - wbc 20.8  Plan: - vancomycin 2000 mg IV x1 given in the ED at 1542, then 1250 mg q12h for est AUC 448 - ceftriaxone 2gm IV q24h and flagyl 500 mg q12h per MD   ___________________________________________  Height: 6\' 4"  (193 cm) Weight: 104.3 kg (230 lb) IBW/kg (Calculated) : 86.8  Temp (24hrs), Avg:100.5 F (38.1 C), Min:99.7 F (37.6 C), Max:101.7 F (38.7 C)  Recent Labs  Lab 04/17/23 0909 04/17/23 0957  WBC 20.8*  --   CREATININE 1.00  --   LATICACIDVEN  --  1.7    Estimated Creatinine Clearance: 91.2 mL/min (by C-G formula based on SCr of 1 mg/dL).    No Known Allergies   Thank you for allowing pharmacy to be a part of this patient's care.  Lucia Gaskins 04/17/2023 5:53 PM

## 2023-04-17 NOTE — H&P (Incomplete)
History and Physical    Patient: Corey Ward:[id number] DOB: [birth year] DOA: 04/17/2023 DOS: the patient was seen and examined on 04/17/2023 PCP: Rodrigo Ran, MD  Patient coming from: {Point_of_Origin:[address]}  Chief Complaint:  Chief Complaint  Patient presents with   Testicle Pain   HPI: Corey Ward is a 73 y.o. male with medical history significant of ***  Review of Systems: {ROS_Text:26778} Past Medical History:  Diagnosis Date   Arthritis    Asthma    When younger no problem now   BPH (benign prostatic hyperplasia)    Diabetes mellitus without complication (HCC)    Gout    Hyperlipidemia    OSA (obstructive sleep apnea)    PONV (postoperative nausea and vomiting)    Sleep apnea    Urinary retention    Past Surgical History:  Procedure Laterality Date   calcium deposits removed from ankle     Left   CARPAL TUNNEL RELEASE Right 12/21/2020   Procedure: RIGHT CARPAL TUNNEL RELEASE;  Surgeon: Cindee Salt, MD;  Location: [facility];  Service: Orthopedics;  Laterality: Right;   INGUINAL HERNIA REPAIR Right    INGUINAL HERNIA REPAIR Bilateral 03/23/2023   Procedure: LAPAROSCOPIC BILATERAL INGUINAL HERNIA REPAIR WITH MESH;  Surgeon: Stechschulte, Hyman Hopes, MD;  Location: [facility];  Service: General;  Laterality: Bilateral;   KNEE SURGERY     arthroscopic  surg   REPLACEMENT TOTAL KNEE Bilateral    TENDON REPAIR Right    foot   TONSILLECTOMY     TOTAL HIP ARTHROPLASTY Right 09/08/2018   Procedure: TOTAL HIP ARTHROPLASTY ANTERIOR APPROACH;  Surgeon: Ollen Gross, MD;  Location: [facility];  Service: Orthopedics;  Laterality: Right;    Social History:  reports that he has never smoked. He has never used smokeless tobacco. He reports that he does not drink alcohol and does not use drugs.  No Known Allergies  Family History  Problem Relation Age of Onset   Heart disease Father    Colon cancer Neg Hx    Esophageal cancer Neg Hx    Rectal cancer Neg Hx     Stomach cancer Neg Hx     Prior to Admission medications   Medication Sig Start Date End Date Taking? Authorizing Provider  allopurinol (ZYLOPRIM) 300 MG tablet Take 300 mg by mouth at bedtime.     [provider]  CANNABIDIOL PO Take 1-2 tablets by mouth at bedtime as needed (sleep). CBD gummies    [provider]  colchicine 0.6 MG tablet Take 0.6 mg by mouth daily as needed (gout).    [provider]  ezetimibe-simvastatin (VYTORIN) 10-40 MG per tablet Take 1 tablet by mouth at bedtime.    [provider]  finasteride (PROSCAR) 5 MG tablet Take 5 mg by mouth every evening.     [provider]  MELATONIN PO Take 1 tablet by mouth at bedtime as needed (sleep.).    [provider]  metFORMIN (GLUCOPHAGE) 500 MG tablet Take 500 mg by mouth 2 (two) times daily with a meal.    [provider]  methocarbamol (ROBAXIN-750) 750 MG tablet Take 1 tablet (750 mg total) by mouth every 6 (six) hours as needed for muscle spasms. 03/23/23   Stechschulte, Hyman Hopes, MD  oxyCODONE-acetaminophen (PERCOCET) 5-325 MG tablet Take 1 tablet by mouth every 4 (four) hours as needed for severe pain (pain score 7-10). 03/23/23 03/22/24  Stechschulte, Hyman Hopes, MD  sildenafil (REVATIO) 20 MG tablet  Take 60-100 mg by mouth daily as needed (erectile dysfunction.). 02/27/23   [provider]  tamsulosin (FLOMAX) 0.4 MG CAPS Take 0.4 mg by mouth in the morning and at bedtime. 06/04/12   [provider]  tirzepatide Greggory Keen) 2.5 MG/0.5ML Pen Inject 2.5 mg into the skin once a week. 03/23/23     tirzepatide (MOUNJARO) 7.5 MG/0.5ML Pen Inject 7.5 mg into the skin every 7 (seven) days. Patient taking differently: Inject 7.5 mg into the skin every Friday. 11/20/22       Physical Exam: Vitals:   04/17/23 1200 04/17/23 1234 04/17/23 1318 04/17/23 1330  BP:  113/62  124/64  Pulse: 85 86  90  Resp: 20 19  18   Temp:   100.1 F (37.8 C)   TempSrc:   Oral    SpO2: 97% 100%  99%  Weight:      Height:       *** Data Reviewed: {Tip this will not be part of the note when signed- Document your independent interpretation of telemetry tracing, EKG, lab, Radiology test or any other diagnostic tests. Add any new diagnostic test ordered today. (Optional):26781} {Results:26384}  Assessment and Plan: No notes have been filed under this hospital service. Service: Hospitalist     Advance Care Planning:   Code Status: Prior ***  Consults: ***  Family Communication: ***  Severity of Illness: {Observation/Inpatient:21159}  Author: Marinda Elk, MD 04/17/2023 3:02 PM  For on call review [email or web address].

## 2023-04-17 NOTE — Consult Note (Signed)
Consulting Physician: Hyman Hopes Jamel Dunton  Referring Provider: Cheron Schaumann, PA-C  Chief Complaint: Left scrotal swelling and pain  Reason for Consult: Scrotal pain 3 weeks after inguinal hernia repair   Subjective   HPI: Corey Ward is an 73 y.o. male who is here for left scrotal pain and swelling 3 weeks after laparoscopic bilateral inguinal hernia repair.  He did well for 3 weeks after surgery then started having mild, and suddenly last night severe left scrotal pain.  I saw him in office yesterday and noted a right inguinal hematoma, but the left groin appeared normal on exam at that time.  Overnight the scrotum swelled, and pain became severe so he presented to the emergency room.  Past Medical History:  Diagnosis Date   Arthritis    Asthma    When younger no problem now   BPH (benign prostatic hyperplasia)    Diabetes mellitus without complication (HCC)    Gout    Hyperlipidemia    OSA (obstructive sleep apnea)    PONV (postoperative nausea and vomiting)    Sleep apnea    Urinary retention     Past Surgical History:  Procedure Laterality Date   calcium deposits removed from ankle     Left   CARPAL TUNNEL RELEASE Right 12/21/2020   Procedure: RIGHT CARPAL TUNNEL RELEASE;  Surgeon: Cindee Salt, MD;  Location: [facility];  Service: Orthopedics;  Laterality: Right;   INGUINAL HERNIA REPAIR Right    INGUINAL HERNIA REPAIR Bilateral 03/23/2023   Procedure: LAPAROSCOPIC BILATERAL INGUINAL HERNIA REPAIR WITH MESH;  Surgeon: Trason Shifflet, Hyman Hopes, MD;  Location: [facility];  Service: General;  Laterality: Bilateral;   KNEE SURGERY     arthroscopic  surg   REPLACEMENT TOTAL KNEE Bilateral    TENDON REPAIR Right    foot   TONSILLECTOMY     TOTAL HIP ARTHROPLASTY Right 09/08/2018   Procedure: TOTAL HIP ARTHROPLASTY ANTERIOR APPROACH;  Surgeon: Ollen Gross, MD;  Location: [facility];  Service: Orthopedics;  Laterality: Right;     Family History  Problem  Relation Age of Onset   Heart disease Father    Colon cancer Neg Hx    Esophageal cancer Neg Hx    Rectal cancer Neg Hx    Stomach cancer Neg Hx     Social:  reports that he has never smoked. He has never used smokeless tobacco. He reports that he does not drink alcohol and does not use drugs.  Allergies: No Known Allergies  Medications: Current Outpatient Medications  Medication Instructions   allopurinol (ZYLOPRIM) 300 mg, Daily at bedtime   CANNABIDIOL PO 1-2 tablets, At bedtime PRN   colchicine 0.6 mg, Daily PRN   ezetimibe-simvastatin (VYTORIN) 10-40 MG per tablet 1 tablet, Daily at bedtime   finasteride (PROSCAR) 5 mg, Every evening   MELATONIN PO 1 tablet, Oral, At bedtime PRN   metFORMIN (GLUCOPHAGE) 500 mg, Oral, 2 times daily with meals   methocarbamol (ROBAXIN-750) 750 mg, Oral, Every 6 hours PRN   Mounjaro 7.5 mg, Subcutaneous, Every 7 days   Mounjaro 2.5 mg, Subcutaneous, Weekly   oxyCODONE-acetaminophen (PERCOCET) 5-325 MG tablet 1 tablet, Oral, Every 4 hours PRN   sildenafil (REVATIO) 60-100 mg, Oral, Daily PRN   tamsulosin (FLOMAX) 0.4 mg, 2 times daily    ROS - all of the below systems have been reviewed with the patient and positives are indicated with bold text General: chills, fever or night sweats Eyes: blurry vision or  double vision ENT: epistaxis or sore throat Allergy/Immunology: itchy/watery eyes or nasal congestion Hematologic/Lymphatic: bleeding problems, blood clots or swollen lymph nodes Endocrine: temperature intolerance or unexpected weight changes Breast: new or changing breast lumps or nipple discharge Resp: cough, shortness of breath, or wheezing CV: chest pain or dyspnea on exertion GI: as per HPI GU: dysuria, trouble voiding, or hematuria MSK: joint pain or joint stiffness Neuro: TIA or stroke symptoms Derm: pruritus and skin lesion changes Psych: anxiety and depression  Objective   PE Blood pressure 136/64, pulse 80, temperature  100.1 F (37.8 C), temperature source Oral, resp. rate 18, height 6\' 4"  (1.93 m), weight 104.3 kg, SpO2 99%. Constitutional: NAD; conversant; no deformities Eyes: Moist conjunctiva; no lid lag; anicteric; PERRL Neck: Trachea midline; no thyromegaly Lungs: Normal respiratory effort; no tactile fremitus CV: RRR; no palpable thrills; no pitting edema GI: Abd Soft, nontender - unable to examine scrotum as patient is settling into a hallway bed in the emergency room MSK: Normal range of motion of extremities; no clubbing/cyanosis Psychiatric: Appropriate affect; alert and oriented x3 Lymphatic: No palpable cervical or axillary lymphadenopathy  Results for orders placed or performed during the hospital encounter of 04/17/23 (from the past 24 hours)  CBC     Status: Abnormal   Collection Time: 04/17/23  9:09 AM  Result Value Ref Range   WBC 20.8 (H) 4.0 - 10.5 K/uL   RBC 4.20 (L) 4.22 - 5.81 MIL/uL   Hemoglobin 12.4 (L) 13.0 - 17.0 g/dL   HCT 45.4 (L) 09.8 - 11.9 %   MCV 91.9 80.0 - 100.0 fL   MCH 29.5 26.0 - 34.0 pg   MCHC 32.1 30.0 - 36.0 g/dL   RDW 14.7 82.9 - 56.2 %   Platelets 247 150 - 400 K/uL   nRBC 0.0 0.0 - 0.2 %  Comprehensive metabolic panel     Status: Abnormal   Collection Time: 04/17/23  9:09 AM  Result Value Ref Range   Sodium 134 (L) 135 - 145 mmol/L   Potassium 3.3 (L) 3.5 - 5.1 mmol/L   Chloride 102 98 - 111 mmol/L   CO2 19 (L) 22 - 32 mmol/L   Glucose, Bld 151 (H) 70 - 99 mg/dL   BUN 18 8 - 23 mg/dL   Creatinine, Ser 1.30 0.61 - 1.24 mg/dL   Calcium 9.0 8.9 - 86.5 mg/dL   Total Protein 7.5 6.5 - 8.1 g/dL   Albumin 4.2 3.5 - 5.0 g/dL   AST 14 (L) 15 - 41 U/L   ALT 10 0 - 44 U/L   Alkaline Phosphatase 67 38 - 126 U/L   Total Bilirubin 1.3 (H) 0.0 - 1.2 mg/dL   GFR, Estimated >78 >46 mL/min   Anion gap 13 5 - 15  Urinalysis, Routine w reflex microscopic -Urine, Clean Catch     Status: Abnormal   Collection Time: 04/17/23  9:09 AM  Result Value Ref Range    Color, Urine YELLOW YELLOW   APPearance HAZY (A) CLEAR   Specific Gravity, Urine 1.017 1.005 - 1.030   pH 5.0 5.0 - 8.0   Glucose, UA NEGATIVE NEGATIVE mg/dL   Hgb urine dipstick MODERATE (A) NEGATIVE   Bilirubin Urine NEGATIVE NEGATIVE   Ketones, ur 20 (A) NEGATIVE mg/dL   Protein, ur NEGATIVE NEGATIVE mg/dL   Nitrite NEGATIVE NEGATIVE   Leukocytes,Ua NEGATIVE NEGATIVE   RBC / HPF 0-5 0 - 5 RBC/hpf   WBC, UA 11-20 0 - 5 WBC/hpf  Bacteria, UA NONE SEEN NONE SEEN   Squamous Epithelial / HPF 0-5 0 - 5 /HPF   Mucus PRESENT    Granular Casts, UA PRESENT   Protime-INR     Status: None   Collection Time: 04/17/23  9:45 AM  Result Value Ref Range   Prothrombin Time 15.1 11.4 - 15.2 seconds   INR 1.2 0.8 - 1.2  I-Stat Lactic Acid, ED     Status: None   Collection Time: 04/17/23  9:57 AM  Result Value Ref Range   Lactic Acid, Venous 1.7 0.5 - 1.9 mmol/L     Imaging Orders         DG Chest 2 View         CT ABDOMEN PELVIS W CONTRAST         US SCROTUM W/DOPPLER      Assessment and Plan   Corey Ward is an 73 y.o. male with left scrotal swelling and pain 3 weeks after inguinal hernia repair.  I reviewed the CT scan.  He has a hematoma in the right groin.  The hernia repair otherwise appears intact and there is no evidence of bowel obstruction or other intra-abdominal complications.  The CT is concerning for left epididymitis.  I'm not sure if hernia repair increases his risk for this.  Hospitalist team has ordered a follow up ultrasound and is admitting hte patient with antibiotics.  It does not appear he needs general surgery intervention at this time.  I will make my partners covering the weekend aware of the patient, but please call general surgery team back if needs arise.      Quentin Ore, MD  [facility] Use [email or web address] to contact on call provider  [id number] - post op

## 2023-04-17 NOTE — Progress Notes (Signed)
ED Pharmacy Antibiotic Sign Off An antibiotic consult was received from an ED provider for vancomycin per pharmacy dosing for cellulitis. A chart review was completed to assess appropriateness.   The following one time order(s) were placed:  Vancomycin 2000 mg IV x1  Further antibiotic and/or antibiotic pharmacy consults should be ordered by the admitting provider if indicated.   Thank you for allowing pharmacy to be a part of this patient's care.   Lucia Gaskins, [facility]  Clinical Pharmacist 04/17/23 2:46 PM

## 2023-04-17 NOTE — Consult Note (Cosign Needed Addendum)
Urology Consult Note   Requesting Attending Physician:  Marinda Elk, MD Service Providing Consult: Urology  Consulting Attending: Dr [name]    Reason for Consult: Left testicular pain with fevers, concern for left epididymoorchitis  HPI: Corey Ward is seen in consultation for reasons noted above at the request of Marinda Elk, MD for concern for left epididymoorchitis.  This is a 73 y.o. male with history of diabetes, hyperlipidemia, gout who is 3 weeks postop from a bilateral inguinal hernia repair with a laparoscopic approach.  This was complicated by a right inguinal hematoma.  Urology consulted given concern for left epididymitis.  Patient reports he is not having left testicular pain for the past 3 days.  He then noticed that he had a fever today to 101.  His wife brought him directly to the hospital.  No nausea no vomiting.  No blood in the urine.  He has had a history of acute urinary retention after surgery in the past.  He did feel like he was urinating okay though.  He has a history of diabetes, hyperlipidemia.  He is not on any blood thinning medication.  He last ate yesterday.   Past Medical History: Past Medical History:  Diagnosis Date   Arthritis    Asthma    When younger no problem now   BPH (benign prostatic hyperplasia)    Diabetes mellitus without complication (HCC)    Gout    Hyperlipidemia    OSA (obstructive sleep apnea)    PONV (postoperative nausea and vomiting)    Sleep apnea    Urinary retention     Past Surgical History:  Past Surgical History:  Procedure Laterality Date   calcium deposits removed from ankle     Left   CARPAL TUNNEL RELEASE Right 12/21/2020   Procedure: RIGHT CARPAL TUNNEL RELEASE;  Surgeon: Cindee Salt, MD;  Location: [facility];  Service: Orthopedics;  Laterality: Right;   INGUINAL HERNIA REPAIR Right    INGUINAL HERNIA REPAIR Bilateral 03/23/2023   Procedure: LAPAROSCOPIC  BILATERAL INGUINAL HERNIA REPAIR WITH MESH;  Surgeon: Stechschulte, Hyman Hopes, MD;  Location: [facility];  Service: General;  Laterality: Bilateral;   KNEE SURGERY     arthroscopic  surg   REPLACEMENT TOTAL KNEE Bilateral    TENDON REPAIR Right    foot   TONSILLECTOMY     TOTAL HIP ARTHROPLASTY Right 09/08/2018   Procedure: TOTAL HIP ARTHROPLASTY ANTERIOR APPROACH;  Surgeon: Ollen Gross, MD;  Location: [facility];  Service: Orthopedics;  Laterality: Right;     Medication: Current Facility-Administered Medications  Medication Dose Route Frequency Provider Last Rate Last Admin   vancomycin (VANCOREADY) IVPB 2000 mg/400 mL  2,000 mg Intravenous Once Lucia Gaskins, RPH 200 mL/hr at 04/17/23 1542 2,000 mg at 04/17/23 1542   Current Outpatient Medications  Medication Sig Dispense Refill   allopurinol (ZYLOPRIM) 300 MG tablet Take 300 mg by mouth at bedtime.      CANNABIDIOL PO Take 1-2 tablets by mouth at bedtime as needed (sleep). CBD gummies     colchicine 0.6 MG tablet Take 0.6 mg by mouth daily as needed (gout).     ezetimibe-simvastatin (VYTORIN) 10-40 MG per tablet Take 1 tablet by mouth at bedtime.     finasteride (PROSCAR) 5 MG tablet Take 5 mg by mouth every evening.      MELATONIN PO Take 1 tablet by mouth at bedtime as needed (sleep.).     metFORMIN (GLUCOPHAGE)  500 MG tablet Take 500 mg by mouth 2 (two) times daily with a meal.     methocarbamol (ROBAXIN-750) 750 MG tablet Take 1 tablet (750 mg total) by mouth every 6 (six) hours as needed for muscle spasms. 30 tablet 0   oxyCODONE-acetaminophen (PERCOCET) 5-325 MG tablet Take 1 tablet by mouth every 4 (four) hours as needed for severe pain (pain score 7-10). 20 tablet 0   sildenafil (REVATIO) 20 MG tablet Take 60-100 mg by mouth daily as needed (erectile dysfunction.).     tamsulosin (FLOMAX) 0.4 MG CAPS Take 0.4 mg by mouth in the morning and at bedtime.     tirzepatide [facility]) 2.5 MG/0.5ML Pen Inject 2.5 mg into the skin once a  week. 2 mL 0   tirzepatide (MOUNJARO) 7.5 MG/0.5ML Pen Inject 7.5 mg into the skin every 7 (seven) days. (Patient taking differently: Inject 7.5 mg into the skin every Friday.) 2 mL 5    Allergies: No Known Allergies  Social History: Social History   Tobacco Use   Smoking status: Never   Smokeless tobacco: Never  Vaping Use   Vaping status: Never Used  Substance Use Topics   Alcohol use: No   Drug use: No    Family History Family History  Problem Relation Age of Onset   Heart disease Father    Colon cancer Neg Hx    Esophageal cancer Neg Hx    Rectal cancer Neg Hx    Stomach cancer Neg Hx     Review of Systems 10 systems were reviewed and are negative except as noted specifically in the HPI.  Objective   Vital signs in last 24 hours: BP 136/64   Pulse 80   Temp 100.1 F (37.8 C) (Oral)   Resp 18   Ht 6\' 4"  (1.93 m)   Wt 104.3 kg   SpO2 99%   BMI 28.00 kg/m   Physical Exam General: NAD, A&O, resting, appropriate HEENT: [place]/AT, EOMI, MMM Pulmonary: Normal work of breathing Cardiovascular: HDS, adequate peripheral perfusion Abdomen: Nondistended. GU: Voiding spontaneously.  Circumcised phallus.  Right testicle with small hydrocele around it.  No pain to palpation.  Right inguinal region hard to palpation likely secondary to known hematoma.  Left hemiscrotum swollen.  Unable to palpate testicle.  However there was pain to palpation in the left testicular region. Extremities: warm and well perfused Neuro: Appropriate, no focal neurological deficits  Most Recent Labs: Lab Results  Component Value Date   WBC 20.8 (H) 04/17/2023   HGB 12.4 (L) 04/17/2023   HCT 38.6 (L) 04/17/2023   PLT 247 04/17/2023    Lab Results  Component Value Date   NA 134 (L) 04/17/2023   K 3.3 (L) 04/17/2023   CL 102 04/17/2023   CO2 19 (L) 04/17/2023   BUN 18 04/17/2023   CREATININE 1.00 04/17/2023   CALCIUM 9.0 04/17/2023    Lab Results  Component Value Date   INR 1.2  04/17/2023   APTT 31 09/02/2018     Urine Culture: Pending   IMAGING: US SCROTUM W/DOPPLER Result Date: 04/17/2023 CLINICAL DATA:  Testicle pain and swelling, recent bilateral inguinal hernia repair EXAM: SCROTAL ULTRASOUND DOPPLER ULTRASOUND OF THE TESTICLES TECHNIQUE: Complete ultrasound examination of the testicles, epididymis, and other scrotal structures was performed. Color and spectral Doppler ultrasound were also utilized to evaluate blood flow to the testicles. COMPARISON:  CT 04/17/2023 FINDINGS: Right testicle Measurements: 5.2 x 2.3 x 3.2 cm. No mass or microlithiasis visualized. Scrotal wall  thickening. Heterogenous echogenic material within the right inguinal region/superior to the right testis, this measures 4.9 x 4.8 x 3.5 cm. Left testicle Measurements: 5 x 3.8 x 3.7 cm. No mass or microlithiasis visualized. Right epididymis:  Not well seen Left epididymis:  Hypervascular.  No mass Hydrocele: Moderate complex left hydrocele with septations and echogenic material. Small right hydrocele. Varicocele:  Small right varicocele Pulsed Doppler interrogation of both testes demonstrates normal low resistance arterial and venous waveforms bilaterally. IMPRESSION: 1. Negative for testicular torsion. 2. Scrotal wall thickening. Heterogenous echogenic material within the distal right inguinal region/superior to the right testis measuring up to 4.9 cm, this may correspond to CT demonstrated fat and fluid hernia and potential cord edema. 3. Moderate complex left hydrocele with septations and echogenic material, differential considerations include hematocele or pyocele. 4. Small right varicocele. 5. Hypervascular left epididymis, question epididymitis. Electronically Signed   By: Jasmine Pang M.D.   On: 04/17/2023 16:42   CT ABDOMEN PELVIS W CONTRAST Result Date: 04/17/2023 CLINICAL DATA:  One day history of left testicular pain and swelling with fever and dizziness. Recent laparoscopic bilateral  inguinal hernia repair on 03/23/2023. EXAM: CT ABDOMEN AND PELVIS WITH CONTRAST TECHNIQUE: Multidetector CT imaging of the abdomen and pelvis was performed using the standard protocol following bolus administration of intravenous contrast. RADIATION DOSE REDUCTION: This exam was performed according to the departmental dose-optimization program which includes automated exposure control, adjustment of the mA and/or kV according to patient size and/or use of iterative reconstruction technique. CONTRAST:  30mL OMNIPAQUE IOHEXOL 300 MG/ML SOLN, OMNIPAQUE IOHEXOL 300 MG/ML SOLN COMPARISON:  None Available. FINDINGS: Lower chest: No focal consolidation or pulmonary nodule in the lung bases. No pleural effusion or pneumothorax demonstrated. Partially imaged heart size is normal. Hepatobiliary: No focal hepatic lesions. No intra or extrahepatic biliary ductal dilation. Normal gallbladder. Pancreas: No focal lesions or main ductal dilation. Spleen: Normal in size without focal abnormality. Adrenals/Urinary Tract: No adrenal nodules. No suspicious renal mass, calculi or hydronephrosis. No focal bladder wall thickening. Metallic radiodensity in the region of the urachus may be postsurgical. Stomach/Bowel: Small hiatal hernia. Normal appearance of the stomach. No evidence of bowel wall thickening, distention, or inflammatory changes. Colonic diverticulosis without acute diverticulitis. Normal appendix. Vascular/Lymphatic: Aortic atherosclerosis. No enlarged abdominal or pelvic lymph nodes. Reproductive: Rounded mild hyperdensity in the posterior bladder is favored to reflect prostatomegaly with median lobe hypertrophy. Asymmetric thickened appearance of the left epididymis with left hydrocele. Suspected asymmetric left scrotal soft tissue thickening. Other: No fluid collection or free air. Trace fluid within the right lower quadrant/right inguinal region. Musculoskeletal: No acute or abnormal lytic or blastic osseous  lesions. Multilevel degenerative changes of the partially imaged thoracic and lumbar spine. Right hip arthroplasty. Small fat and ascites containing right inguinal hernia. Surgical clips along bilateral anterior lower abdominal wall. IMPRESSION: 1. Asymmetric thickened appearance of the left epididymis with left hydrocele and suspected asymmetric left scrotal soft tissue thickening, which may reflect epididymitis. Recommend further evaluation with scrotal ultrasound. 2. Small fat and ascites containing right inguinal hernia. 3.  Aortic Atherosclerosis (ICD10-I70.0). Electronically Signed   By: Agustin Cree M.D.   On: 04/17/2023 13:25   DG Chest 2 View Result Date: 04/17/2023 CLINICAL DATA:  Fever, dizziness. EXAM: CHEST - 2 VIEW COMPARISON:  None Available. FINDINGS: The heart size and mediastinal contours are within normal limits. Both lungs are clear. The visualized skeletal structures are unremarkable. IMPRESSION: No active cardiopulmonary disease. Electronically Signed   By: Fayrene Fearing  Christen Butter M.D.   On: 04/17/2023 10:31    ------  Assessment:  73 y.o. male with recent bilateral laparoscopic inguinal hernia repairs now with likely left epididymitis with a complex fluid collection around the left testicle with fevers at home.    Creatinine 1 today baseline around 0.8.  CBC with leukocytosis of 20.8.  Stat lactic acid negative.  Urine culture and blood cultures pending.  We discussed treatment with IV antibiotics and transitioning to oral antibiotics.  Patient does have a small complex fluid collection around the testicle however this may resolve with antibiotics.  At this time, recommend conservative therapy.   Recommendations: # Febrile left epididymoorchitis -Scrotal ultrasound showed fluid of both testicles with concern for left epididymitis as well as a left complex fluid collection around left testicle.  Good flow to both testicles. -At this time, we will plan for conservative management with  IV antibiotics.  -Please BladderScan patient after voiding to ensure he is emptying. -UA without evidence of infection.  Leukocyte and nitrite negative.  Urine culture still pending at this time.  Follow-up urine culture and treat based on sensitivities.  If culture is negative, would treat with ciprofloxacin or Levaquin for total course of 2 weeks. -Please make n.p.o. at midnight in case of intervention if patient does not improve with antibiotics -Urology will continue to follow    Thank you for this consult. Please contact the urology consult pager with any further questions/concerns.

## 2023-04-17 NOTE — ED Provider Notes (Signed)
[place] EMERGENCY DEPARTMENT AT [facility] Provider Note   CSN: [id number] Arrival date & time: 04/17/23  6213     History  Chief Complaint  Patient presents with   Testicle Pain    Corey Ward is a 73 y.o. male.  Patient complains of swelling to his scrotum.  Patient had hernia repair on January 6.  Patient reports that he rechecked with his surgeon yesterday.  Patient reports he was having some discomfort yesterday however today he has had a fever chills and increasing pain.  Patient denies any nausea or vomiting.  He denies any cough or congestion.  The history is provided by the patient. No language interpreter was used.  Testicle Pain This is a new problem. The current episode started yesterday. He has tried nothing for the symptoms. The treatment provided moderate relief.       Home Medications Prior to Admission medications   Medication Sig Start Date End Date Taking? Authorizing Provider  allopurinol (ZYLOPRIM) 300 MG tablet Take 300 mg by mouth at bedtime.     [provider]  CANNABIDIOL PO Take 1-2 tablets by mouth at bedtime as needed (sleep). CBD gummies    [provider]  colchicine 0.6 MG tablet Take 0.6 mg by mouth daily as needed (gout).    [provider]  ezetimibe-simvastatin (VYTORIN) 10-40 MG per tablet Take 1 tablet by mouth at bedtime.    [provider]  finasteride (PROSCAR) 5 MG tablet Take 5 mg by mouth every evening.     [provider]  MELATONIN PO Take 1 tablet by mouth at bedtime as needed (sleep.).    [provider]  metFORMIN (GLUCOPHAGE) 500 MG tablet Take 500 mg by mouth 2 (two) times daily with a meal.    [provider]  methocarbamol (ROBAXIN-750) 750 MG tablet Take 1 tablet (750 mg total) by mouth every 6 (six) hours as needed for muscle spasms. 03/23/23   Stechschulte, Hyman Hopes, MD  oxyCODONE-acetaminophen (PERCOCET) 5-325 MG tablet Take 1 tablet by mouth every  4 (four) hours as needed for severe pain (pain score 7-10). 03/23/23 03/22/24  Stechschulte, Hyman Hopes, MD  sildenafil (REVATIO) 20 MG tablet Take 60-100 mg by mouth daily as needed (erectile dysfunction.). 02/27/23   [provider]  tamsulosin (FLOMAX) 0.4 MG CAPS Take 0.4 mg by mouth in the morning and at bedtime. 06/04/12   [provider]  tirzepatide Greggory Keen) 2.5 MG/0.5ML Pen Inject 2.5 mg into the skin once a week. 03/23/23     tirzepatide (MOUNJARO) 7.5 MG/0.5ML Pen Inject 7.5 mg into the skin every 7 (seven) days. Patient taking differently: Inject 7.5 mg into the skin every Friday. 11/20/22         Allergies    Patient has no known allergies.    Review of Systems   Review of Systems  Genitourinary:  Positive for testicular pain.  All other systems reviewed and are negative.   Physical Exam Updated Vital Signs BP 124/64   Pulse 90   Temp 100.1 F (37.8 C) (Oral)   Resp 18   Ht 6\' 4"  (1.93 m)   Wt 104.3 kg   SpO2 99%   BMI 28.00 kg/m  Physical Exam Vitals and nursing note reviewed.  Constitutional:      Appearance: He is well-developed.  HENT:     Head: Normocephalic.  Cardiovascular:     Rate and Rhythm: Normal rate.  Pulmonary:     Effort:  Pulmonary effort is normal.  Abdominal:     General: There is no distension.  Genitourinary:    Comments: Erythematous swollen scrotum,  tender bilat  Musculoskeletal:        General: Normal range of motion.     Cervical back: Normal range of motion.  Skin:    General: Skin is warm.  Neurological:     General: No focal deficit present.     Mental Status: He is alert and oriented to person, place, and time.     ED Results / Procedures / Treatments   Labs (all labs ordered are listed, but only abnormal results are displayed) Labs Reviewed  CBC - Abnormal; Notable for the following components:      Result Value   WBC 20.8 (*)    RBC 4.20 (*)    Hemoglobin 12.4 (*)    HCT 38.6 (*)    All other components  within normal limits  COMPREHENSIVE METABOLIC PANEL - Abnormal; Notable for the following components:   Sodium 134 (*)    Potassium 3.3 (*)    CO2 19 (*)    Glucose, Bld 151 (*)    AST 14 (*)    Total Bilirubin 1.3 (*)    All other components within normal limits  URINALYSIS, ROUTINE W REFLEX MICROSCOPIC - Abnormal; Notable for the following components:   APPearance HAZY (*)    Hgb urine dipstick MODERATE (*)    Ketones, ur 20 (*)    All other components within normal limits  CULTURE, BLOOD (ROUTINE X 2)  CULTURE, BLOOD (ROUTINE X 2)  PROTIME-INR  I-STAT CG4 LACTIC ACID, ED    EKG EKG Interpretation Date/Time:  Friday April 17 2023 08:23:04 EST Ventricular Rate:  95 PR Interval:  158 QRS Duration:  81 QT Interval:  370 QTC Calculation: 466 R Axis:   36  Text Interpretation: Sinus rhythm Borderline low voltage, extremity leads Nonspecific T abnormalities, lateral leads Baseline wander in lead(s) II III aVL aVF Since last tracing rate faster Confirmed by Linwood Dibbles [phone]) on 04/17/2023 8:27:32 AM  Radiology CT ABDOMEN PELVIS W CONTRAST Result Date: 04/17/2023 CLINICAL DATA:  One day history of left testicular pain and swelling with fever and dizziness. Recent laparoscopic bilateral inguinal hernia repair on 03/23/2023. EXAM: CT ABDOMEN AND PELVIS WITH CONTRAST TECHNIQUE: Multidetector CT imaging of the abdomen and pelvis was performed using the standard protocol following bolus administration of intravenous contrast. RADIATION DOSE REDUCTION: This exam was performed according to the departmental dose-optimization program which includes automated exposure control, adjustment of the mA and/or kV according to patient size and/or use of iterative reconstruction technique. CONTRAST:  30mL OMNIPAQUE IOHEXOL 300 MG/ML SOLN, OMNIPAQUE IOHEXOL 300 MG/ML SOLN COMPARISON:  None Available. FINDINGS: Lower chest: No focal consolidation or pulmonary nodule in the lung bases. No pleural  effusion or pneumothorax demonstrated. Partially imaged heart size is normal. Hepatobiliary: No focal hepatic lesions. No intra or extrahepatic biliary ductal dilation. Normal gallbladder. Pancreas: No focal lesions or main ductal dilation. Spleen: Normal in size without focal abnormality. Adrenals/Urinary Tract: No adrenal nodules. No suspicious renal mass, calculi or hydronephrosis. No focal bladder wall thickening. Metallic radiodensity in the region of the urachus may be postsurgical. Stomach/Bowel: Small hiatal hernia. Normal appearance of the stomach. No evidence of bowel wall thickening, distention, or inflammatory changes. Colonic diverticulosis without acute diverticulitis. Normal appendix. Vascular/Lymphatic: Aortic atherosclerosis. No enlarged abdominal or pelvic lymph nodes. Reproductive: Rounded mild hyperdensity in the posterior bladder is favored to  reflect prostatomegaly with median lobe hypertrophy. Asymmetric thickened appearance of the left epididymis with left hydrocele. Suspected asymmetric left scrotal soft tissue thickening. Other: No fluid collection or free air. Trace fluid within the right lower quadrant/right inguinal region. Musculoskeletal: No acute or abnormal lytic or blastic osseous lesions. Multilevel degenerative changes of the partially imaged thoracic and lumbar spine. Right hip arthroplasty. Small fat and ascites containing right inguinal hernia. Surgical clips along bilateral anterior lower abdominal wall. IMPRESSION: 1. Asymmetric thickened appearance of the left epididymis with left hydrocele and suspected asymmetric left scrotal soft tissue thickening, which may reflect epididymitis. Recommend further evaluation with scrotal ultrasound. 2. Small fat and ascites containing right inguinal hernia. 3.  Aortic Atherosclerosis (ICD10-I70.0). Electronically Signed   By: Agustin Cree M.D.   On: 04/17/2023 13:25   DG Chest 2 View Result Date: 04/17/2023 CLINICAL DATA:  Fever,  dizziness. EXAM: CHEST - 2 VIEW COMPARISON:  None Available. FINDINGS: The heart size and mediastinal contours are within normal limits. Both lungs are clear. The visualized skeletal structures are unremarkable. IMPRESSION: No active cardiopulmonary disease. Electronically Signed   By: Lupita Raider M.D.   On: 04/17/2023 10:31    Procedures Procedures    Medications Ordered in ED Medications  acetaminophen (TYLENOL) tablet 650 mg (650 mg Oral Given 04/17/23 0921)  lactated ringers bolus 2,000 mL (2,000 mLs Intravenous New Bag/Given 04/17/23 1001)  cefTRIAXone (ROCEPHIN) 2 g in sodium chloride 0.9 % 100 mL IVPB (0 g Intravenous Stopped 04/17/23 1124)  iohexol (OMNIPAQUE) 300 MG/ML solution 30 mL (30 mLs Oral Contrast Given 04/17/23 1238)  sodium chloride 0.9 % bolus 1,000 mL (1,000 mLs Intravenous New Bag/Given 04/17/23 1233)  iohexol (OMNIPAQUE) 300 MG/ML solution 100 mL (100 mLs Intravenous Contrast Given 04/17/23 1238)    ED Course/ Medical Decision Making/ A&P                                 Medical Decision Making Patient complains of fever and scrotal pain.  Amount and/or Complexity of Data Reviewed Independent Historian: parent    Details: And is here with his wife who is supportive External Data Reviewed: notes.    Details: General surgery notes reviewed Labs: ordered. Decision-making details documented in ED Course.    Details: Labs ordered reviewed and interpreted patient has an elevated white blood cell count of 20.  Lactic acid is 1.7. Radiology: ordered.    Details: X-CT ABDOMEN PELVIS W CONTRAST Result Date: 04/17/2023 CLINICAL DATA:  One day history of left testicular pain and swelling with fever and dizziness. Recent laparoscopic bilateral inguinal hernia repair on 03/23/2023. EXAM: CT ABDOMEN AND PELVIS WITH CONTRAST TECHNIQUE: Multidetector CT imaging of the abdomen and pelvis was performed using the standard protocol following bolus administration of intravenous  contrast. RADIATION DOSE REDUCTION: This exam was performed according to the departmental dose-optimization program which includes automated exposure control, adjustment of the mA and/or kV according to patient size and/or use of iterative reconstruction technique. CONTRAST:  30mL OMNIPAQUE IOHEXOL 300 MG/ML SOLN, OMNIPAQUE IOHEXOL 300 MG/ML SOLN COMPARISON:  None Available. FINDINGS: Lower chest: No focal consolidation or pulmonary nodule in the lung bases. No pleural effusion or pneumothorax demonstrated. Partially imaged heart size is normal. Hepatobiliary: No focal hepatic lesions. No intra or extrahepatic biliary ductal dilation. Normal gallbladder. Pancreas: No focal lesions or main ductal dilation. Spleen: Normal in size without focal abnormality. Adrenals/Urinary Tract: No adrenal nodules.  No suspicious renal mass, calculi or hydronephrosis. No focal bladder wall thickening. Metallic radiodensity in the region of the urachus may be postsurgical. Stomach/Bowel: Small hiatal hernia. Normal appearance of the stomach. No evidence of bowel wall thickening, distention, or inflammatory changes. Colonic diverticulosis without acute diverticulitis. Normal appendix. Vascular/Lymphatic: Aortic atherosclerosis. No enlarged abdominal or pelvic lymph nodes. Reproductive: Rounded mild hyperdensity in the posterior bladder is favored to reflect prostatomegaly with median lobe hypertrophy. Asymmetric thickened appearance of the left epididymis with left hydrocele. Suspected asymmetric left scrotal soft tissue thickening. Other: No fluid collection or free air. Trace fluid within the right lower quadrant/right inguinal region. Musculoskeletal: No acute or abnormal lytic or blastic osseous lesions. Multilevel degenerative changes of the partially imaged thoracic and lumbar spine. Right hip arthroplasty. Small fat and ascites containing right inguinal hernia. Surgical clips along bilateral anterior lower abdominal wall.  IMPRESSION: 1. Asymmetric thickened appearance of the left epididymis with left hydrocele and suspected asymmetric left scrotal soft tissue thickening, which may reflect epididymitis. Recommend further evaluation with scrotal ultrasound. 2. Small fat and ascites containing right inguinal hernia. 3.  Aortic Atherosclerosis (ICD10-I70.0). Electronically Signed   By: Agustin Cree M.D.   On: 04/17/2023 13:25   DG Chest 2 View Result Date: 04/17/2023 CLINICAL DATA:  Fever, dizziness. EXAM: CHEST - 2 VIEW COMPARISON:  None Available. FINDINGS: The heart size and mediastinal contours are within normal limits. Both lungs are clear. The visualized skeletal structures are unremarkable. IMPRESSION: No active cardiopulmonary disease. Electronically Signed   By: Lupita Raider M.D.   On: 04/17/2023 10:31  rays ordered reviewed and interpreted chest x-ray shows no acute cardiopulmonary disease.  Discussion of management or test interpretation with external provider(s): General Surgery consulted  Carl Best PA spoke with Dr.  Dossie Der who advised Ct scan. I spoke with Dr. David Stall who will see for admission              Electronically signed by Stechschulte, Hyman Hopes, MD at 03/23/2023 11:23 AM     Admission (Discharged) on 03/23/2023       Routing History       Detailed Report      Note shared with patient Care Timeline  01/06   LAPAROSCOPIC BILATERAL INGUINAL HERNIA REPAIR WITH MESH    Discharged 1514    Risk OTC drugs. Prescription drug management. Decision regarding hospitalization.           Final Clinical Impression(s) / ED Diagnoses Final diagnoses:  Cellulitis, scrotum    Rx / DC Orders ED Discharge Orders     None         Corey Ward 04/17/23 1456    Linwood Dibbles, MD 04/19/23 [phone]

## 2023-04-17 NOTE — Plan of Care (Signed)
  Problem: Coping: Goal: Level of anxiety will decrease Outcome: Progressing   Problem: Pain Managment: Goal: General experience of comfort will improve and/or be controlled Outcome: Progressing

## 2023-04-17 NOTE — ED Triage Notes (Addendum)
Patient reports left testicle pain and swelling, fever, and dizziness x 1 day. Patient recently had hernia laparoscopic repair.

## 2023-04-17 NOTE — ED Notes (Signed)
Multiple attempts to bladder scan for post void residual with no success due to bladder scanner probe being broken

## 2023-04-17 NOTE — H&P (Signed)
History and Physical  Corey Ward QMV:[id number] DOB: October 02, 1950 DOA: 04/17/2023  PCP: Rodrigo Ran, MD Patient coming from: Home  I have personally briefly reviewed patient's old medical records in [facility]   Chief Complaint: left testicular pain and fevers  HPI: Corey Ward is a 73 y.o. male past medical history of gout, hyperlipidemia, diabetes mellitus type 2 with a last A1c 5.9 on Mounjaro, with recent right greater than left inguinal hernia repair who was doing fine until Saturday when he started having difficulty urinating that went away after 30 minutes and then he was able to urinate without any difficulties, but yesterday started having scrotal swelling and left more than right scrotal pain erythema and exquisitely tender scrotum after his visit with the general surgeon (for inguinal repair follow-up surgery appointment), this morning he could not get out of bed feels nauseated anorexic with a fever.   In the ED: Was found to have febrile with a temperature of 100.7 blood pressure stable, white count of 21,000, UA showed no bacteria 11-20 white blood cells.,  Blood cultures were sent by the ED was started empirically on IV vancomycin and Rocephin, CT scan of the abdomen pelvis showed asymmetric thickening of the left epididymitis with left hydrocele and left scrotal swelling.  Vascular ultrasound rule out ovarian torsion was sent.  Review of Systems: All systems reviewed and apart from history of presenting illness, are negative.  Past Medical History:  Diagnosis Date   Arthritis    Asthma    When younger no problem now   BPH (benign prostatic hyperplasia)    Diabetes mellitus without complication (HCC)    Gout    Hyperlipidemia    OSA (obstructive sleep apnea)    PONV (postoperative nausea and vomiting)    Sleep apnea    Urinary retention    Past Surgical History:  Procedure Laterality Date   calcium deposits removed from ankle     Left   CARPAL TUNNEL  RELEASE Right 12/21/2020   Procedure: RIGHT CARPAL TUNNEL RELEASE;  Surgeon: Cindee Salt, MD;  Location:  [facility];  Service: Orthopedics;  Laterality: Right;   INGUINAL HERNIA REPAIR Right    INGUINAL HERNIA REPAIR Bilateral 03/23/2023   Procedure: LAPAROSCOPIC BILATERAL INGUINAL HERNIA REPAIR WITH MESH;  Surgeon: Stechschulte, Hyman Hopes, MD;  Location: [facility];  Service: General;  Laterality: Bilateral;   KNEE SURGERY     arthroscopic  surg   REPLACEMENT TOTAL KNEE Bilateral    TENDON REPAIR Right    foot   TONSILLECTOMY     TOTAL HIP ARTHROPLASTY Right 09/08/2018   Procedure: TOTAL HIP ARTHROPLASTY ANTERIOR APPROACH;  Surgeon: Ollen Gross, MD;  Location: [facility];  Service: Orthopedics;  Laterality: Right;    Social History:  reports that he has never smoked. He has never used smokeless tobacco. He reports that he does not drink alcohol and does not use drugs.   No Known Allergies  Family History  Problem Relation Age of Onset   Heart disease Father    Colon cancer Neg Hx    Esophageal cancer Neg Hx    Rectal cancer Neg Hx    Stomach cancer Neg Hx     Prior to Admission medications   Medication Sig Start Date End Date Taking? Authorizing Provider  allopurinol (ZYLOPRIM) 300 MG tablet Take 300 mg by mouth at bedtime.     [provider]  CANNABIDIOL PO Take 1-2 tablets by mouth at bedtime  as needed (sleep). CBD gummies    [provider]  colchicine 0.6 MG tablet Take 0.6 mg by mouth daily as needed (gout).    [provider]  ezetimibe-simvastatin (VYTORIN) 10-40 MG per tablet Take 1 tablet by mouth at bedtime.    [provider]  finasteride (PROSCAR) 5 MG tablet Take 5 mg by mouth every evening.     [provider]  MELATONIN PO Take 1 tablet by mouth at bedtime as needed (sleep.).    [provider]  metFORMIN (GLUCOPHAGE) 500 MG tablet Take 500 mg by mouth 2 (two) times daily with a meal.    [provider]  methocarbamol (ROBAXIN-750) 750 MG tablet Take 1 tablet (750 mg total) by mouth every 6 (six) hours as needed for muscle spasms. 03/23/23   Stechschulte, Hyman Hopes, MD  oxyCODONE-acetaminophen (PERCOCET) 5-325 MG tablet Take 1 tablet by mouth every 4 (four) hours as needed for severe pain (pain score 7-10). 03/23/23 03/22/24  Stechschulte, Hyman Hopes, MD  sildenafil (REVATIO) 20 MG tablet Take 60-100 mg by mouth daily as needed (erectile dysfunction.). 02/27/23   [provider]  tamsulosin (FLOMAX) 0.4 MG CAPS Take 0.4 mg by mouth in the morning and at bedtime. 06/04/12   [provider]  tirzepatide Greggory Keen) 2.5 MG/0.5ML Pen Inject 2.5 mg into the skin once a week. 03/23/23     tirzepatide (MOUNJARO) 7.5 MG/0.5ML Pen Inject 7.5 mg into the skin every 7 (seven) days. Patient taking differently: Inject 7.5 mg into the skin every Friday. 11/20/22      Physical Exam: Vitals:   04/17/23 1318 04/17/23 1330 04/17/23 1400 04/17/23 1430  BP:  124/64 125/63 136/64  Pulse:  90 84 80  Resp:  18    Temp: 100.1 F (37.8 C)     TempSrc: Oral     SpO2:  99% 99% 99%  Weight:      Height:        General exam: Moderately built and nourished patient, lying comfortably supine on the gurney in no obvious distress. Head, eyes and ENT: Nontraumatic and normocephalic. Neck: Supple. No JVD, carotid bruit or thyromegaly. Lymphatics: No lymphadenopathy. Respiratory system: Clear to auscultation. No increased work of breathing. Cardiovascular system: S1 and S2 heard, RRR.  Gastrointestinal system: Abdomen is nondistended, soft and nontender. Normal bowel sounds heard. No organomegaly or masses appreciated. Extremities: Symmetric 5 x 5 power. Peripheral pulses symmetrically felt.  Skin: No rashes or acute findings. Musculoskeletal system: Negative exam. Psychiatry: Pleasant and cooperative.   Labs on Admission:  Basic Metabolic Panel: Recent Labs  Lab 04/17/23 0909  NA 134*  K 3.3*   CL 102  CO2 19*  GLUCOSE 151*  BUN 18  CREATININE 1.00  CALCIUM 9.0   Liver Function Tests: Recent Labs  Lab 04/17/23 0909  AST 14*  ALT 10  ALKPHOS 67  BILITOT 1.3*  PROT 7.5  ALBUMIN 4.2   No results for input(s): "LIPASE", "AMYLASE" in the last 168 hours. No results for input(s): "AMMONIA" in the last 168 hours. CBC: Recent Labs  Lab 04/17/23 0909  WBC 20.8*  HGB 12.4*  HCT 38.6*  MCV 91.9  PLT 247   Cardiac Enzymes: No results for input(s): "CKTOTAL", "CKMB", "CKMBINDEX", "TROPONINI" in the last 168 hours.  BNP (last 3 results) No results for input(s): "PROBNP" in the last 8760 hours. CBG: No results for input(s): "GLUCAP" in the last 168 hours.  Radiological Exams on Admission: CT ABDOMEN PELVIS W  CONTRAST Result Date: 04/17/2023 CLINICAL DATA:  One day history of left testicular pain and swelling with fever and dizziness. Recent laparoscopic bilateral inguinal hernia repair on 03/23/2023. EXAM: CT ABDOMEN AND PELVIS WITH CONTRAST TECHNIQUE: Multidetector CT imaging of the abdomen and pelvis was performed using the standard protocol following bolus administration of intravenous contrast. RADIATION DOSE REDUCTION: This exam was performed according to the departmental dose-optimization program which includes automated exposure control, adjustment of the mA and/or kV according to patient size and/or use of iterative reconstruction technique. CONTRAST:  30mL OMNIPAQUE IOHEXOL 300 MG/ML SOLN, OMNIPAQUE IOHEXOL 300 MG/ML SOLN COMPARISON:  None Available. FINDINGS: Lower chest: No focal consolidation or pulmonary nodule in the lung bases. No pleural effusion or pneumothorax demonstrated. Partially imaged heart size is normal. Hepatobiliary: No focal hepatic lesions. No intra or extrahepatic biliary ductal dilation. Normal gallbladder. Pancreas: No focal lesions or main ductal dilation. Spleen: Normal in size without focal abnormality. Adrenals/Urinary Tract: No adrenal  nodules. No suspicious renal mass, calculi or hydronephrosis. No focal bladder wall thickening. Metallic radiodensity in the region of the urachus may be postsurgical. Stomach/Bowel: Small hiatal hernia. Normal appearance of the stomach. No evidence of bowel wall thickening, distention, or inflammatory changes. Colonic diverticulosis without acute diverticulitis. Normal appendix. Vascular/Lymphatic: Aortic atherosclerosis. No enlarged abdominal or pelvic lymph nodes. Reproductive: Rounded mild hyperdensity in the posterior bladder is favored to reflect prostatomegaly with median lobe hypertrophy. Asymmetric thickened appearance of the left epididymis with left hydrocele. Suspected asymmetric left scrotal soft tissue thickening. Other: No fluid collection or free air. Trace fluid within the right lower quadrant/right inguinal region. Musculoskeletal: No acute or abnormal lytic or blastic osseous lesions. Multilevel degenerative changes of the partially imaged thoracic and lumbar spine. Right hip arthroplasty. Small fat and ascites containing right inguinal hernia. Surgical clips along bilateral anterior lower abdominal wall. IMPRESSION: 1. Asymmetric thickened appearance of the left epididymis with left hydrocele and suspected asymmetric left scrotal soft tissue thickening, which may reflect epididymitis. Recommend further evaluation with scrotal ultrasound. 2. Small fat and ascites containing right inguinal hernia. 3.  Aortic Atherosclerosis (ICD10-I70.0). Electronically Signed   By: Agustin Cree M.D.   On: 04/17/2023 13:25   DG Chest 2 View Result Date: 04/17/2023 CLINICAL DATA:  Fever, dizziness. EXAM: CHEST - 2 VIEW COMPARISON:  None Available. FINDINGS: The heart size and mediastinal contours are within normal limits. Both lungs are clear. The visualized skeletal structures are unremarkable. IMPRESSION: No active cardiopulmonary disease. Electronically Signed   By: Lupita Raider M.D.   On: 04/17/2023 10:31     EKG: Independently reviewed.  None  Assessment/Plan Sepsis likely due to left epididymitis versus scrotal cellulitis: CT scan of the abdomen pelvis showed asymmetric thickening of the left epididymitis, the left testicle is warm to touch and exquisitely tender elevation relieves the pain. Lactic acid is unremarkable. General surgery was consulted by the ED and awaiting further recommendations. Will consult urology for further evaluation and follow-up for possible left epididymitis. Scrotal Doppler to rule out left testicular torsion is pending at this time of this dictation Since he just had a surgical procedure, will get blood cultures. Send urine cultures. Start him on IV vancomycin continue IV Rocephin. Sent for urine culture.  Controlled type 2 diabetes mellitus without complication, without long-term current use of insulin (HCC) Will go ahead and place him on sliding scale insulin and carb modified diet. Hold metformin low-dose Lantus.  Hypokalemia: Replete orally recheck in the morning.  Hypovolemic hyponatremia: Rule  out oral hydration recheck basic metabolic panel in the morning.  Elevated bilirubin: Likely due to infectious etiology recheck in the morning.  Normocytic anemia: Follow-up PCP as an outpatient.  History of gout: Continue allopurinol.  Hyperlipidemia: Continue statins.     DVT Prophylaxis: lovenox Code Status: full  Family Communication: None Disposition Plan: inpatient   Time spent: 75 min    It is my clinical opinion that admission to INPATIENT is reasonable and necessary in this 73 y.o. male past medical history of diabetes mellitus comes in with left scrotal pain warm to touch erythematous leukocytosis and probably septic start empirically on antibiotics.   Given the aforementioned, the predictability of an adverse outcome is felt to be significant. I expect that the patient will require at least 2 midnights in the hospital to treat  this condition.  Marinda Elk MD [facility]   04/17/2023, 3:25 PM

## 2023-04-18 DIAGNOSIS — N179 Acute kidney failure, unspecified: Secondary | ICD-10-CM | POA: Diagnosis not present

## 2023-04-18 DIAGNOSIS — A419 Sepsis, unspecified organism: Secondary | ICD-10-CM | POA: Diagnosis not present

## 2023-04-18 DIAGNOSIS — R6520 Severe sepsis without septic shock: Secondary | ICD-10-CM

## 2023-04-18 DIAGNOSIS — N453 Epididymo-orchitis: Secondary | ICD-10-CM | POA: Diagnosis not present

## 2023-04-18 LAB — CBC
HCT: 34 % — ABNORMAL LOW (ref 39.0–52.0)
Hemoglobin: 11 g/dL — ABNORMAL LOW (ref 13.0–17.0)
MCH: 30.5 pg (ref 26.0–34.0)
MCHC: 32.4 g/dL (ref 30.0–36.0)
MCV: 94.2 fL (ref 80.0–100.0)
Platelets: 219 10*3/uL (ref 150–400)
RBC: 3.61 MIL/uL — ABNORMAL LOW (ref 4.22–5.81)
RDW: 14.1 % (ref 11.5–15.5)
WBC: 29.1 10*3/uL — ABNORMAL HIGH (ref 4.0–10.5)
nRBC: 0 % (ref 0.0–0.2)

## 2023-04-18 LAB — COMPREHENSIVE METABOLIC PANEL
ALT: 8 U/L (ref 0–44)
AST: 18 U/L (ref 15–41)
Albumin: 3.3 g/dL — ABNORMAL LOW (ref 3.5–5.0)
Alkaline Phosphatase: 53 U/L (ref 38–126)
Anion gap: 10 (ref 5–15)
BUN: 15 mg/dL (ref 8–23)
CO2: 23 mmol/L (ref 22–32)
Calcium: 8.6 mg/dL — ABNORMAL LOW (ref 8.9–10.3)
Chloride: 104 mmol/L (ref 98–111)
Creatinine, Ser: 0.98 mg/dL (ref 0.61–1.24)
GFR, Estimated: >60 mL/min (ref >60)
Glucose, Bld: 121 mg/dL — ABNORMAL HIGH (ref 70–99)
Potassium: 4 mmol/L (ref 3.5–5.1)
Sodium: 137 mmol/L (ref 135–145)
Total Bilirubin: 0.9 mg/dL (ref 0.0–1.2)
Total Protein: 6.6 g/dL (ref 6.5–8.1)

## 2023-04-18 LAB — GLUCOSE, CAPILLARY
Glucose-Capillary: 143 mg/dL — ABNORMAL HIGH (ref 70–99)
Glucose-Capillary: 113 mg/dL — ABNORMAL HIGH (ref 70–99)
Glucose-Capillary: 116 mg/dL — ABNORMAL HIGH (ref 70–99)
Glucose-Capillary: 111 mg/dL — ABNORMAL HIGH (ref 70–99)

## 2023-04-18 LAB — URINE CULTURE: Culture: NO GROWTH

## 2023-04-18 NOTE — Progress Notes (Signed)
   04/18/23 1329  Assess: MEWS Score  Temp (!) 101.7 F (38.7 C)  BP 118/62  MAP (mmHg) 78  Pulse Rate 89  SpO2 96 %  O2 Device Room Air  Assess: MEWS Score  MEWS Temp 2  MEWS Systolic 0  MEWS Pulse 0  MEWS RR 0  MEWS LOC 0  MEWS Score 2  MEWS Score Color Yellow  Assess: if the MEWS score is Yellow or Red  Were vital signs accurate and taken at a resting state? Yes  Does the patient meet 2 or more of the SIRS criteria? No  MEWS guidelines implemented  Yes, yellow  Treat  MEWS Interventions Considered administering scheduled or prn medications/treatments as ordered  Take Vital Signs  Increase Vital Sign Frequency  Yellow: Q2hr x1, continue Q4hrs until patient remains green for 12hrs  Escalate  MEWS: Escalate Yellow: Discuss with charge nurse and consider notifying provider and/or RRT  Notify: Charge Nurse/RN  Name of Charge Nurse/RN Notified Wendee Beavers, RN  Provider Notification  Provider Name/Title David Stall, MD  Date Provider Notified 04/18/23  Time Provider Notified 1345  Method of Notification Page (secure chat)  Notification Reason Other (Comment)  Provider response No new orders  Date of Provider Response 04/18/23  Time of Provider Response 1345  Assess: SIRS CRITERIA  SIRS Temperature  1  SIRS Respirations  0  SIRS Pulse 0  SIRS WBC 0  SIRS Score Sum  1

## 2023-04-18 NOTE — Progress Notes (Signed)
   04/18/23 2216  BiPAP/CPAP/SIPAP  BiPAP/CPAP/SIPAP Pt Type Adult  BiPAP/CPAP/SIPAP DREAMSTATIOND  Mask Type Full face mask  Mask Size Large  FiO2 (%) 21 %  Patient Home Equipment No  Auto Titrate Yes (5-20 cmH2O)

## 2023-04-18 NOTE — Progress Notes (Signed)
[facility] PROGRESS NOTE    Progress Note  Corey Ward  NFA:[id number] DOB: 1951-02-19 DOA: 04/17/2023 PCP: Rodrigo Ran, MD     Brief Narrative:   Corey Ward is an 73 y.o. male past medical history of gout hyperlipidemia diabetes mellitus type 2 with a last A1c of 5.9 on Mounjaro had a recent right and left inguinal hernia repair comes in for left scrotal pain swelling erythema and fever.  Scrotal Doppler was negative for torsion. being treated for sepsis secondary to acute infectious epididymitis. Urology was consulted   Assessment/Plan:   Sepsis secondary to left Epididymo-orchitis versus scrotal cellulitis: Scrotal Doppler was negative for testicular torsion. Was started empirically on IV vancomycin Rocephin and Flagyl. He is defervescing, Tmax of 100.7. On physical exam less warm and tender. General surgery was consulted recommended no surgical intervention. Urology was consulted who recommended broad-spectrum antibiotics follow-up on cultures n.p.o. after midnight in case surgical intervention is needed, but they agree with management is likely left infectious epididymitis Urine cultures and blood cultures have been sent. Apply scrotal support  Controlled type 2 diabetes mellitus without complication, without long-term current use of insulin: Carb modified diet, continue Lantus low-dose per sliding scale. Continue hold metformin.  Hypokalemia: Repleted orally now improved.  Hypovolemic hyponatremia: Resolved with volume resuscitation.  Elevated bilirubin: Resolved likely due to infectious etiology.  Drug-induced weight loss Noted  Normocytic anemia: Follow-up PCP as an outpatient.   History of gout: Continue allopurinol.   Hyperlipidemia: Continue statins.    DVT prophylaxis: lovenox Family Communication:none Status is: Inpatient Remains inpatient appropriate because: Epididymitis    Code Status:     Code Status Orders  (From admission,  onward)           Start     Ordered   04/17/23 1533  Full code  Continuous       Question:  By:  Answer:  Consent: discussion documented in EHR   04/17/23 1534           Code Status History     Date Active Date Inactive Code Status Order ID Comments User Context   09/08/2018 1554 09/09/2018 1823 Full Code [id number]  Derenda Fennel, PA Inpatient         IV Access:   Peripheral IV   Procedures and diagnostic studies:   US SCROTUM W/DOPPLER Result Date: 04/17/2023 CLINICAL DATA:  Testicle pain and swelling, recent bilateral inguinal hernia repair EXAM: SCROTAL ULTRASOUND DOPPLER ULTRASOUND OF THE TESTICLES TECHNIQUE: Complete ultrasound examination of the testicles, epididymis, and other scrotal structures was performed. Color and spectral Doppler ultrasound were also utilized to evaluate blood flow to the testicles. COMPARISON:  CT 04/17/2023 FINDINGS: Right testicle Measurements: 5.2 x 2.3 x 3.2 cm. No mass or microlithiasis visualized. Scrotal wall thickening. Heterogenous echogenic material within the right inguinal region/superior to the right testis, this measures 4.9 x 4.8 x 3.5 cm. Left testicle Measurements: 5 x 3.8 x 3.7 cm. No mass or microlithiasis visualized. Right epididymis:  Not well seen Left epididymis:  Hypervascular.  No mass Hydrocele: Moderate complex left hydrocele with septations and echogenic material. Small right hydrocele. Varicocele:  Small right varicocele Pulsed Doppler interrogation of both testes demonstrates normal low resistance arterial and venous waveforms bilaterally. IMPRESSION: 1. Negative for testicular torsion. 2. Scrotal wall thickening. Heterogenous echogenic material within the distal right inguinal region/superior to the right testis measuring up to 4.9 cm, this may correspond to CT demonstrated fat and fluid hernia and potential cord  edema. 3. Moderate complex left hydrocele with septations and echogenic material, differential  considerations include hematocele or pyocele. 4. Small right varicocele. 5. Hypervascular left epididymis, question epididymitis. Electronically Signed   By: Jasmine Pang M.D.   On: 04/17/2023 16:42   CT ABDOMEN PELVIS W CONTRAST Result Date: 04/17/2023 CLINICAL DATA:  One day history of left testicular pain and swelling with fever and dizziness. Recent laparoscopic bilateral inguinal hernia repair on 03/23/2023. EXAM: CT ABDOMEN AND PELVIS WITH CONTRAST TECHNIQUE: Multidetector CT imaging of the abdomen and pelvis was performed using the standard protocol following bolus administration of intravenous contrast. RADIATION DOSE REDUCTION: This exam was performed according to the departmental dose-optimization program which includes automated exposure control, adjustment of the mA and/or kV according to patient size and/or use of iterative reconstruction technique. CONTRAST:  30mL OMNIPAQUE IOHEXOL 300 MG/ML SOLN, OMNIPAQUE IOHEXOL 300 MG/ML SOLN COMPARISON:  None Available. FINDINGS: Lower chest: No focal consolidation or pulmonary nodule in the lung bases. No pleural effusion or pneumothorax demonstrated. Partially imaged heart size is normal. Hepatobiliary: No focal hepatic lesions. No intra or extrahepatic biliary ductal dilation. Normal gallbladder. Pancreas: No focal lesions or main ductal dilation. Spleen: Normal in size without focal abnormality. Adrenals/Urinary Tract: No adrenal nodules. No suspicious renal mass, calculi or hydronephrosis. No focal bladder wall thickening. Metallic radiodensity in the region of the urachus may be postsurgical. Stomach/Bowel: Small hiatal hernia. Normal appearance of the stomach. No evidence of bowel wall thickening, distention, or inflammatory changes. Colonic diverticulosis without acute diverticulitis. Normal appendix. Vascular/Lymphatic: Aortic atherosclerosis. No enlarged abdominal or pelvic lymph nodes. Reproductive: Rounded mild hyperdensity in the posterior  bladder is favored to reflect prostatomegaly with median lobe hypertrophy. Asymmetric thickened appearance of the left epididymis with left hydrocele. Suspected asymmetric left scrotal soft tissue thickening. Other: No fluid collection or free air. Trace fluid within the right lower quadrant/right inguinal region. Musculoskeletal: No acute or abnormal lytic or blastic osseous lesions. Multilevel degenerative changes of the partially imaged thoracic and lumbar spine. Right hip arthroplasty. Small fat and ascites containing right inguinal hernia. Surgical clips along bilateral anterior lower abdominal wall. IMPRESSION: 1. Asymmetric thickened appearance of the left epididymis with left hydrocele and suspected asymmetric left scrotal soft tissue thickening, which may reflect epididymitis. Recommend further evaluation with scrotal ultrasound. 2. Small fat and ascites containing right inguinal hernia. 3.  Aortic Atherosclerosis (ICD10-I70.0). Electronically Signed   By: Agustin Cree M.D.   On: 04/17/2023 13:25   DG Chest 2 View Result Date: 04/17/2023 CLINICAL DATA:  Fever, dizziness. EXAM: CHEST - 2 VIEW COMPARISON:  None Available. FINDINGS: The heart size and mediastinal contours are within normal limits. Both lungs are clear. The visualized skeletal structures are unremarkable. IMPRESSION: No active cardiopulmonary disease. Electronically Signed   By: Lupita Raider M.D.   On: 04/17/2023 10:31     Medical Consultants:   None.   Subjective:    Kandyce Rud feels better nausea resolved.  Objective:    Vitals:   04/17/23 2203 04/17/23 2333 04/18/23 0138 04/18/23 0554  BP: (!) 105/53  121/73 107/65  Pulse: 87 87 71 83  Resp: 17 17 17 17   Temp: (!) 100.7 F (38.2 C)  98.6 F (37 C) 98.2 F (36.8 C)  TempSrc: Oral     SpO2: 96% 96% 100% 96%  Weight:      Height:       SpO2: 96 % FiO2 (%): 21 %   Intake/Output Summary (Last 24 hours)  at 04/18/2023 0850 Last data filed at 04/18/2023  0600 Gross per 24 hour  Intake 709.61 ml  Output 800 ml  Net -90.39 ml   Filed Weights   04/17/23 0857 04/17/23 1946  Weight: 104.3 kg 104.3 kg    Exam: General exam: In no acute distress. Respiratory system: Good air movement and clear to auscultation. Cardiovascular system: S1 & S2 heard, RRR. No JVD. Gastrointestinal system: Abdomen is nondistended, soft and nontender.  Extremities: No pedal edema. Skin: No rashes, lesions or ulcers Psychiatry: Judgement and insight appear normal. Mood & affect appropriate.    Data Reviewed:    Labs: Basic Metabolic Panel: Recent Labs  Lab 04/17/23 0909 04/18/23 0332  NA 134* 137  K 3.3* 4.0  CL 102 104  CO2 19* 23  GLUCOSE 151* 121*  BUN 18 15  CREATININE 1.00 0.98  CALCIUM 9.0 8.6*   GFR Estimated Creatinine Clearance: 93.1 mL/min (by C-G formula based on SCr of 0.98 mg/dL). Liver Function Tests: Recent Labs  Lab 04/17/23 0909 04/18/23 0332  AST 14* 18  ALT 10 8  ALKPHOS 67 53  BILITOT 1.3* 0.9  PROT 7.5 6.6  ALBUMIN 4.2 3.3*   No results for input(s): "LIPASE", "AMYLASE" in the last 168 hours. No results for input(s): "AMMONIA" in the last 168 hours. Coagulation profile Recent Labs  Lab 04/17/23 0945  INR 1.2   COVID-19 Labs  No results for input(s): "DDIMER", "FERRITIN", "LDH", "CRP" in the last 72 hours.  Lab Results  Component Value Date   SARSCOV2NAA NEGATIVE 09/04/2018    CBC: Recent Labs  Lab 04/17/23 0909 04/18/23 0332  WBC 20.8* 29.1*  HGB 12.4* 11.0*  HCT 38.6* 34.0*  MCV 91.9 94.2  PLT 247 219   Cardiac Enzymes: No results for input(s): "CKTOTAL", "CKMB", "CKMBINDEX", "TROPONINI" in the last 168 hours. BNP (last 3 results) No results for input(s): "PROBNP" in the last 8760 hours. CBG: Recent Labs  Lab 04/17/23 2040 04/18/23 0841  GLUCAP 137* 143*   D-Dimer: No results for input(s): "DDIMER" in the last 72 hours. Hgb A1c: No results for input(s): "HGBA1C" in the last 72  hours. Lipid Profile: No results for input(s): "CHOL", "HDL", "LDLCALC", "TRIG", "CHOLHDL", "LDLDIRECT" in the last 72 hours. Thyroid function studies: No results for input(s): "TSH", "T4TOTAL", "T3FREE", "THYROIDAB" in the last 72 hours.  Invalid input(s): "FREET3" Anemia work up: No results for input(s): "VITAMINB12", "FOLATE", "FERRITIN", "TIBC", "IRON", "RETICCTPCT" in the last 72 hours. Sepsis Labs: Recent Labs  Lab 04/17/23 0909 04/17/23 0957 04/18/23 0332  WBC 20.8*  --  29.1*  LATICACIDVEN  --  1.7  --    Microbiology No results found for this or any previous visit (from the past 240 hours).   Medications:    allopurinol  300 mg Oral QHS   enoxaparin (LOVENOX) injection  40 mg Subcutaneous Q24H   ezetimibe  10 mg Oral QHS   And   simvastatin  40 mg Oral QHS   finasteride  5 mg Oral QPM   insulin aspart  0-15 Units Subcutaneous TID WC   insulin aspart  0-5 Units Subcutaneous QHS   insulin aspart  3 Units Subcutaneous TID WC   insulin glargine-yfgn  5 Units Subcutaneous QHS   polyethylene glycol  17 g Oral BID   sodium chloride flush  3 mL Intravenous Q12H   tamsulosin  0.4 mg Oral QPC supper   Continuous Infusions:  sodium chloride     cefTRIAXone (ROCEPHIN)  IV  metronidazole 500 mg (04/18/23 0548)   vancomycin 1,250 mg (04/18/23 0336)      LOS: 1 day   Marinda Elk  [facility]  04/18/2023, 8:50 AM

## 2023-04-18 NOTE — Plan of Care (Signed)
   Problem: Education: Goal: Knowledge of General Education information will improve Description: Including pain rating scale, medication(s)/side effects and non-pharmacologic comfort measures Outcome: Progressing   Problem: Clinical Measurements: Goal: Will remain free from infection Outcome: Progressing Goal: Diagnostic test results will improve Outcome: Progressing

## 2023-04-19 DIAGNOSIS — N453 Epididymo-orchitis: Secondary | ICD-10-CM | POA: Diagnosis not present

## 2023-04-19 LAB — GLUCOSE, CAPILLARY
Glucose-Capillary: 108 mg/dL — ABNORMAL HIGH (ref 70–99)
Glucose-Capillary: 99 mg/dL (ref 70–99)
Glucose-Capillary: 168 mg/dL — ABNORMAL HIGH (ref 70–99)
Glucose-Capillary: 130 mg/dL — ABNORMAL HIGH (ref 70–99)

## 2023-04-19 LAB — CBC
HCT: 30.8 % — ABNORMAL LOW (ref 39.0–52.0)
Hemoglobin: 9.9 g/dL — ABNORMAL LOW (ref 13.0–17.0)
MCH: 29.9 pg (ref 26.0–34.0)
MCHC: 32.1 g/dL (ref 30.0–36.0)
MCV: 93.1 fL (ref 80.0–100.0)
Platelets: 221 10*3/uL (ref 150–400)
RBC: 3.31 MIL/uL — ABNORMAL LOW (ref 4.22–5.81)
RDW: 14.3 % (ref 11.5–15.5)
WBC: 19.8 10*3/uL — ABNORMAL HIGH (ref 4.0–10.5)
nRBC: 0 % (ref 0.0–0.2)

## 2023-04-19 LAB — HIV ANTIBODY (ROUTINE TESTING W REFLEX): HIV Screen 4th Generation wRfx: NONREACTIVE

## 2023-04-19 MED ORDER — METRONIDAZOLE 500 MG PO TABS
500.0000 mg | ORAL_TABLET | Freq: Two times a day (BID) | ORAL | Status: DC
Start: 2023-04-19 — End: 2023-04-20
  Administered 2023-04-19 – 2023-04-20 (×2): 500 mg via ORAL
  Filled 2023-04-19 (×2): qty 1

## 2023-04-19 MED ORDER — METRONIDAZOLE 500 MG PO TABS: 500.0000 mg | ORAL_TABLET | Freq: Three times a day (TID) | ORAL | Status: DC

## 2023-04-19 MED ORDER — DOXYCYCLINE HYCLATE 100 MG PO TABS
100.0000 mg | ORAL_TABLET | Freq: Two times a day (BID) | ORAL | Status: DC
Start: 2023-04-19 — End: 2023-04-20
  Administered 2023-04-19 – 2023-04-20 (×3): 100 mg via ORAL
  Filled 2023-04-19 (×3): qty 1

## 2023-04-19 NOTE — Progress Notes (Signed)
[facility] PROGRESS NOTE    Progress Note  Corey Ward  EXB:[id number] DOB: 02/27/1951 DOA: 04/17/2023 PCP: Rodrigo Ran, MD     Brief Narrative:   Corey Ward is an 72 y.o. male past medical history of gout hyperlipidemia diabetes mellitus type 2 with a last A1c of 5.9 on Mounjaro had a recent right and left inguinal hernia repair comes in for left scrotal pain swelling erythema and fever.  Scrotal Doppler was negative for torsion. being treated for sepsis secondary to acute infectious epididymitis. Scrotal Doppler was negative for testicular torsion. Urology was consulted   Assessment/Plan:   Sepsis secondary to left Epididymo-orchitis versus scrotal cellulitis: Currently on IV Rocephin Flagyl, discontinue vancomycin and start oral Doxy. Tmax of 101.7. leukoCytosis improving. On physical exam less warm and tender. Urology was consulted who recommended broad-spectrum antibiotics, , but they agree with management is likely left infectious epididymitis Urine cultures and blood cultures have been negative till date. Apply scrotal support  Controlled type 2 diabetes mellitus without complication, without long-term current use of insulin: Carb modified diet, continue Lantus low-dose per sliding scale. Continue hold metformin.  Hypokalemia: Repleted orally now improved.  Hypovolemic hyponatremia: Resolved with volume resuscitation.  Elevated bilirubin: Resolved likely due to infectious etiology.  Drug-induced weight loss Noted  Normocytic anemia: Follow-up PCP as an outpatient.   History of gout: Continue allopurinol.   Hyperlipidemia: Continue statins.    DVT prophylaxis: lovenox Family Communication:none Status is: Inpatient Remains inpatient appropriate because: Epididymitis    Code Status:     Code Status Orders  (From admission, onward)           Start     Ordered   04/17/23 1533  Full code  Continuous       Question:  By:  Answer:   Consent: discussion documented in EHR   04/17/23 1534           Code Status History     Date Active Date Inactive Code Status Order ID Comments User Context   09/08/2018 1554 09/09/2018 1823 Full Code [id number]  Derenda Fennel, PA Inpatient         IV Access:   Peripheral IV   Procedures and diagnostic studies:   US SCROTUM W/DOPPLER Result Date: 04/17/2023 CLINICAL DATA:  Testicle pain and swelling, recent bilateral inguinal hernia repair EXAM: SCROTAL ULTRASOUND DOPPLER ULTRASOUND OF THE TESTICLES TECHNIQUE: Complete ultrasound examination of the testicles, epididymis, and other scrotal structures was performed. Color and spectral Doppler ultrasound were also utilized to evaluate blood flow to the testicles. COMPARISON:  CT 04/17/2023 FINDINGS: Right testicle Measurements: 5.2 x 2.3 x 3.2 cm. No mass or microlithiasis visualized. Scrotal wall thickening. Heterogenous echogenic material within the right inguinal region/superior to the right testis, this measures 4.9 x 4.8 x 3.5 cm. Left testicle Measurements: 5 x 3.8 x 3.7 cm. No mass or microlithiasis visualized. Right epididymis:  Not well seen Left epididymis:  Hypervascular.  No mass Hydrocele: Moderate complex left hydrocele with septations and echogenic material. Small right hydrocele. Varicocele:  Small right varicocele Pulsed Doppler interrogation of both testes demonstrates normal low resistance arterial and venous waveforms bilaterally. IMPRESSION: 1. Negative for testicular torsion. 2. Scrotal wall thickening. Heterogenous echogenic material within the distal right inguinal region/superior to the right testis measuring up to 4.9 cm, this may correspond to CT demonstrated fat and fluid hernia and potential cord edema. 3. Moderate complex left hydrocele with septations and echogenic material, differential considerations include hematocele or  pyocele. 4. Small right varicocele. 5. Hypervascular left epididymis, question  epididymitis. Electronically Signed   By: Jasmine Pang M.D.   On: 04/17/2023 16:42   CT ABDOMEN PELVIS W CONTRAST Result Date: 04/17/2023 CLINICAL DATA:  One day history of left testicular pain and swelling with fever and dizziness. Recent laparoscopic bilateral inguinal hernia repair on 03/23/2023. EXAM: CT ABDOMEN AND PELVIS WITH CONTRAST TECHNIQUE: Multidetector CT imaging of the abdomen and pelvis was performed using the standard protocol following bolus administration of intravenous contrast. RADIATION DOSE REDUCTION: This exam was performed according to the departmental dose-optimization program which includes automated exposure control, adjustment of the mA and/or kV according to patient size and/or use of iterative reconstruction technique. CONTRAST:  30mL OMNIPAQUE IOHEXOL 300 MG/ML SOLN, OMNIPAQUE IOHEXOL 300 MG/ML SOLN COMPARISON:  None Available. FINDINGS: Lower chest: No focal consolidation or pulmonary nodule in the lung bases. No pleural effusion or pneumothorax demonstrated. Partially imaged heart size is normal. Hepatobiliary: No focal hepatic lesions. No intra or extrahepatic biliary ductal dilation. Normal gallbladder. Pancreas: No focal lesions or main ductal dilation. Spleen: Normal in size without focal abnormality. Adrenals/Urinary Tract: No adrenal nodules. No suspicious renal mass, calculi or hydronephrosis. No focal bladder wall thickening. Metallic radiodensity in the region of the urachus may be postsurgical. Stomach/Bowel: Small hiatal hernia. Normal appearance of the stomach. No evidence of bowel wall thickening, distention, or inflammatory changes. Colonic diverticulosis without acute diverticulitis. Normal appendix. Vascular/Lymphatic: Aortic atherosclerosis. No enlarged abdominal or pelvic lymph nodes. Reproductive: Rounded mild hyperdensity in the posterior bladder is favored to reflect prostatomegaly with median lobe hypertrophy. Asymmetric thickened appearance of the  left epididymis with left hydrocele. Suspected asymmetric left scrotal soft tissue thickening. Other: No fluid collection or free air. Trace fluid within the right lower quadrant/right inguinal region. Musculoskeletal: No acute or abnormal lytic or blastic osseous lesions. Multilevel degenerative changes of the partially imaged thoracic and lumbar spine. Right hip arthroplasty. Small fat and ascites containing right inguinal hernia. Surgical clips along bilateral anterior lower abdominal wall. IMPRESSION: 1. Asymmetric thickened appearance of the left epididymis with left hydrocele and suspected asymmetric left scrotal soft tissue thickening, which may reflect epididymitis. Recommend further evaluation with scrotal ultrasound. 2. Small fat and ascites containing right inguinal hernia. 3.  Aortic Atherosclerosis (ICD10-I70.0). Electronically Signed   By: Agustin Cree M.D.   On: 04/17/2023 13:25     Medical Consultants:   None.   Subjective:    Corey Ward is better tenderness is improved.  Objective:    Vitals:   04/18/23 1807 04/18/23 2231 04/19/23 0149 04/19/23 0616  BP: 123/63 (!) 108/59 (!) 97/49 (!) 86/48  Pulse: 80 77 69 71  Resp: 16 16 16 16   Temp: 100.2 F (37.9 C) 99.2 F (37.3 C) 98.4 F (36.9 C) 98.7 F (37.1 C)  TempSrc:  Oral Oral   SpO2: 99% 97% 97% 95%  Weight:      Height:       SpO2: 95 % FiO2 (%): 21 %   Intake/Output Summary (Last 24 hours) at 04/19/2023 1103 Last data filed at 04/19/2023 0851 Gross per 24 hour  Intake 1890.42 ml  Output 0 ml  Net 1890.42 ml   Filed Weights   04/17/23 0857 04/17/23 1946  Weight: 104.3 kg 104.3 kg    Exam: General exam: In no acute distress. Respiratory system: Good air movement and clear to auscultation. Cardiovascular system: S1 & S2 heard, RRR. No JVD. Gastrointestinal system: Abdomen is nondistended, soft  and nontender.  Extremities: No pedal edema. Skin: No rashes, lesions or ulcers Psychiatry: Judgement and  insight appear normal. Mood & affect appropriate.  Data Reviewed:    Labs: Basic Metabolic Panel: Recent Labs  Lab 04/17/23 0909 04/18/23 0332  NA 134* 137  K 3.3* 4.0  CL 102 104  CO2 19* 23  GLUCOSE 151* 121*  BUN 18 15  CREATININE 1.00 0.98  CALCIUM 9.0 8.6*   GFR Estimated Creatinine Clearance: 93.1 mL/min (by C-G formula based on SCr of 0.98 mg/dL). Liver Function Tests: Recent Labs  Lab 04/17/23 0909 04/18/23 0332  AST 14* 18  ALT 10 8  ALKPHOS 67 53  BILITOT 1.3* 0.9  PROT 7.5 6.6  ALBUMIN 4.2 3.3*   No results for input(s): "LIPASE", "AMYLASE" in the last 168 hours. No results for input(s): "AMMONIA" in the last 168 hours. Coagulation profile Recent Labs  Lab 04/17/23 0945  INR 1.2   COVID-19 Labs  No results for input(s): "DDIMER", "FERRITIN", "LDH", "CRP" in the last 72 hours.  Lab Results  Component Value Date   SARSCOV2NAA NEGATIVE 09/04/2018    CBC: Recent Labs  Lab 04/17/23 0909 04/18/23 0332 04/19/23 0936  WBC 20.8* 29.1* 19.8*  HGB 12.4* 11.0* 9.9*  HCT 38.6* 34.0* 30.8*  MCV 91.9 94.2 93.1  PLT 247 219 221   Cardiac Enzymes: No results for input(s): "CKTOTAL", "CKMB", "CKMBINDEX", "TROPONINI" in the last 168 hours. BNP (last 3 results) No results for input(s): "PROBNP" in the last 8760 hours. CBG: Recent Labs  Lab 04/18/23 0841 04/18/23 1131 04/18/23 1609 04/18/23 2231 04/19/23 0807  GLUCAP 143* 113* 116* 111* 108*   D-Dimer: No results for input(s): "DDIMER" in the last 72 hours. Hgb A1c: No results for input(s): "HGBA1C" in the last 72 hours. Lipid Profile: No results for input(s): "CHOL", "HDL", "LDLCALC", "TRIG", "CHOLHDL", "LDLDIRECT" in the last 72 hours. Thyroid function studies: No results for input(s): "TSH", "T4TOTAL", "T3FREE", "THYROIDAB" in the last 72 hours.  Invalid input(s): "FREET3" Anemia work up: No results for input(s): "VITAMINB12", "FOLATE", "FERRITIN", "TIBC", "IRON", "RETICCTPCT" in the  last 72 hours. Sepsis Labs: Recent Labs  Lab 04/17/23 0909 04/17/23 0957 04/18/23 0332 04/19/23 0936  WBC 20.8*  --  29.1* 19.8*  LATICACIDVEN  --  1.7  --   --    Microbiology Recent Results (from the past 240 hours)  Urine Culture (for pregnant, neutropenic or urologic patients or patients with an indwelling urinary catheter)     Status: None   Collection Time: 04/17/23  9:09 AM   Specimen: Urine, Clean Catch  Result Value Ref Range Status   Specimen Description   Final    URINE, CLEAN CATCH Performed at [facility], [address] [place], [place] [address]    Special Requests   Final    NONE Performed at [facility], [address] [place], [place] [address]    Culture   Final    NO GROWTH Performed at [facility], [address] [place], [place] [address]    Report Status 04/18/2023 FINAL  Final  Culture, blood (Routine x 2)     Status: None (Preliminary result)   Collection Time: 04/17/23  9:45 AM   Specimen: BLOOD  Result Value Ref Range Status   Specimen Description   Final    BLOOD BLOOD LEFT HAND Performed at [facility], [address], [place], [place] [address]    Special Requests  Final    BOTTLES DRAWN AEROBIC AND ANAEROBIC Blood Culture adequate volume Performed at [facility], [address] [place], [place] [address]    Culture   Final    NO GROWTH 2 DAYS Performed at [facility], [address], [place], [place] [address]    Report Status PENDING  Incomplete  Culture, blood (Routine x 2)     Status: None (Preliminary result)   Collection Time: 04/17/23  9:45 AM   Specimen: BLOOD  Result Value Ref Range Status   Specimen Description   Final    BLOOD RIGHT ANTECUBITAL Performed at [facility], [address] [place], [place] [address]    Special Requests   Final    BOTTLES DRAWN AEROBIC AND ANAEROBIC Blood Culture adequate  volume Performed at [facility], [address] [place], [place] [address]    Culture   Final    NO GROWTH 2 DAYS Performed at [facility], [address] [place], [place] [address]    Report Status PENDING  Incomplete     Medications:    allopurinol  300 mg Oral QHS   enoxaparin (LOVENOX) injection  40 mg Subcutaneous Q24H   ezetimibe  10 mg Oral QHS   And   simvastatin  40 mg Oral QHS   finasteride  5 mg Oral QPM   insulin aspart  0-15 Units Subcutaneous TID WC   insulin aspart  0-5 Units Subcutaneous QHS   insulin aspart  3 Units Subcutaneous TID WC   insulin glargine-yfgn  5 Units Subcutaneous QHS   polyethylene glycol  17 g Oral BID   sodium chloride flush  3 mL Intravenous Q12H   tamsulosin  0.4 mg Oral QPC supper   Continuous Infusions:  cefTRIAXone (ROCEPHIN)  IV 2 g (04/19/23 0913)   metronidazole 500 mg (04/19/23 0534)   vancomycin 1,250 mg (04/19/23 0345)      LOS: 2 days   Marinda Elk  [facility]  04/19/2023, 11:03 AM

## 2023-04-19 NOTE — Progress Notes (Signed)
   04/19/23 2341  BiPAP/CPAP/SIPAP  BiPAP/CPAP/SIPAP Pt Type Adult  BiPAP/CPAP/SIPAP DREAMSTATIOND  Mask Type Full face mask  Mask Size Large  Respiratory Rate 18 breaths/min  FiO2 (%) 21 %  Patient Home Equipment No  Auto Titrate Yes

## 2023-04-19 NOTE — Progress Notes (Signed)
Subjective: Patient doing well. Soft BP this morning but asymptomatic.   Labs with improvement of WBC now down to 19.8 from 29 yesterday.   Pain well controlled.   Objective: Vital signs in last 24 hours: Temp:  [98.4 F (36.9 C)-101.7 F (38.7 C)] 98.7 F (37.1 C) (02/02 0616) Pulse Rate:  [69-89] 71 (02/02 0616) Resp:  [16] 16 (02/02 0616) BP: (86-123)/(48-63) 86/48 (02/02 0616) SpO2:  [95 %-99 %] 95 % (02/02 0616) FiO2 (%):  [21 %] 21 % (02/01 2216)  Assessment/Plan: # Febrile left epididymoorchitis -given improvement in WBC and stable exam, ok to transition to Oral antibiotics. Can do cefdinir for 10 more days.  -if blood pressure stabilizes, ok to discharge from urology perspective on oral antibiotics.  -outpatient follow up with urology to be requested.  Intake/Output from previous day: 02/01 0701 - 02/02 0700 In: 1890.4 [P.O.:1360; IV Piggyback:530.4] Out: 400 [Urine:400]  Intake/Output this shift: Total I/O In: 120 [P.O.:120] Out: -   Physical Exam:  General: Alert and oriented CV: No cyanosis Lungs: equal chest rise Abdomen: Soft, NTND, no rebound or guarding Gu: no scrotal cellulitis. There are some mild right scrotal edema that is reactive around the right testicle. The left testicle is swollen and indurated but no areas of fluctuance. Size of left testicle unchanged. The inguinal areas appear normal without erythema. The glans and meatus appear normal.   Lab Results: Recent Labs    04/17/23 0909 04/18/23 0332 04/19/23 0936  HGB 12.4* 11.0* 9.9*  HCT 38.6* 34.0* 30.8*   BMET Recent Labs    04/17/23 0909 04/18/23 0332  NA 134* 137  K 3.3* 4.0  CL 102 104  CO2 19* 23  GLUCOSE 151* 121*  BUN 18 15  CREATININE 1.00 0.98  CALCIUM 9.0 8.6*     Studies/Results: US SCROTUM W/DOPPLER Result Date: 04/17/2023 CLINICAL DATA:  Testicle pain and swelling, recent bilateral inguinal hernia repair EXAM: SCROTAL ULTRASOUND DOPPLER ULTRASOUND OF THE  TESTICLES TECHNIQUE: Complete ultrasound examination of the testicles, epididymis, and other scrotal structures was performed. Color and spectral Doppler ultrasound were also utilized to evaluate blood flow to the testicles. COMPARISON:  CT 04/17/2023 FINDINGS: Right testicle Measurements: 5.2 x 2.3 x 3.2 cm. No mass or microlithiasis visualized. Scrotal wall thickening. Heterogenous echogenic material within the right inguinal region/superior to the right testis, this measures 4.9 x 4.8 x 3.5 cm. Left testicle Measurements: 5 x 3.8 x 3.7 cm. No mass or microlithiasis visualized. Right epididymis:  Not well seen Left epididymis:  Hypervascular.  No mass Hydrocele: Moderate complex left hydrocele with septations and echogenic material. Small right hydrocele. Varicocele:  Small right varicocele Pulsed Doppler interrogation of both testes demonstrates normal low resistance arterial and venous waveforms bilaterally. IMPRESSION: 1. Negative for testicular torsion. 2. Scrotal wall thickening. Heterogenous echogenic material within the distal right inguinal region/superior to the right testis measuring up to 4.9 cm, this may correspond to CT demonstrated fat and fluid hernia and potential cord edema. 3. Moderate complex left hydrocele with septations and echogenic material, differential considerations include hematocele or pyocele. 4. Small right varicocele. 5. Hypervascular left epididymis, question epididymitis. Electronically Signed   By: Jasmine Pang M.D.   On: 04/17/2023 16:42   CT ABDOMEN PELVIS W CONTRAST Result Date: 04/17/2023 CLINICAL DATA:  One day history of left testicular pain and swelling with fever and dizziness. Recent laparoscopic bilateral inguinal hernia repair on 03/23/2023. EXAM: CT ABDOMEN AND PELVIS WITH CONTRAST TECHNIQUE: Multidetector CT imaging  of the abdomen and pelvis was performed using the standard protocol following bolus administration of intravenous contrast. RADIATION DOSE REDUCTION:  This exam was performed according to the departmental dose-optimization program which includes automated exposure control, adjustment of the mA and/or kV according to patient size and/or use of iterative reconstruction technique. CONTRAST:  30mL OMNIPAQUE IOHEXOL 300 MG/ML SOLN, OMNIPAQUE IOHEXOL 300 MG/ML SOLN COMPARISON:  None Available. FINDINGS: Lower chest: No focal consolidation or pulmonary nodule in the lung bases. No pleural effusion or pneumothorax demonstrated. Partially imaged heart size is normal. Hepatobiliary: No focal hepatic lesions. No intra or extrahepatic biliary ductal dilation. Normal gallbladder. Pancreas: No focal lesions or main ductal dilation. Spleen: Normal in size without focal abnormality. Adrenals/Urinary Tract: No adrenal nodules. No suspicious renal mass, calculi or hydronephrosis. No focal bladder wall thickening. Metallic radiodensity in the region of the urachus may be postsurgical. Stomach/Bowel: Small hiatal hernia. Normal appearance of the stomach. No evidence of bowel wall thickening, distention, or inflammatory changes. Colonic diverticulosis without acute diverticulitis. Normal appendix. Vascular/Lymphatic: Aortic atherosclerosis. No enlarged abdominal or pelvic lymph nodes. Reproductive: Rounded mild hyperdensity in the posterior bladder is favored to reflect prostatomegaly with median lobe hypertrophy. Asymmetric thickened appearance of the left epididymis with left hydrocele. Suspected asymmetric left scrotal soft tissue thickening. Other: No fluid collection or free air. Trace fluid within the right lower quadrant/right inguinal region. Musculoskeletal: No acute or abnormal lytic or blastic osseous lesions. Multilevel degenerative changes of the partially imaged thoracic and lumbar spine. Right hip arthroplasty. Small fat and ascites containing right inguinal hernia. Surgical clips along bilateral anterior lower abdominal wall. IMPRESSION: 1. Asymmetric thickened  appearance of the left epididymis with left hydrocele and suspected asymmetric left scrotal soft tissue thickening, which may reflect epididymitis. Recommend further evaluation with scrotal ultrasound. 2. Small fat and ascites containing right inguinal hernia. 3.  Aortic Atherosclerosis (ICD10-I70.0). Electronically Signed   By: Agustin Cree M.D.   On: 04/17/2023 13:25   DG Chest 2 View Result Date: 04/17/2023 CLINICAL DATA:  Fever, dizziness. EXAM: CHEST - 2 VIEW COMPARISON:  None Available. FINDINGS: The heart size and mediastinal contours are within normal limits. Both lungs are clear. The visualized skeletal structures are unremarkable. IMPRESSION: No active cardiopulmonary disease. Electronically Signed   By: Lupita Raider M.D.   On: 04/17/2023 10:31      LOS: 2 days   Jerald Kief, MD, PhD [facility] Resident  [facility]    04/19/2023, 10:26 AM

## 2023-04-19 NOTE — Plan of Care (Signed)
  Problem: Coping: Goal: Level of anxiety will decrease 04/19/2023 0239 by Kizzie Bane, RN Outcome: Progressing 04/19/2023 0239 by Kizzie Bane, RN Outcome: Progressing   Problem: Elimination: Goal: Will not experience complications related to bowel motility 04/19/2023 0239 by Kizzie Bane, RN Outcome: Progressing 04/19/2023 0239 by Kizzie Bane, RN Outcome: Progressing Goal: Will not experience complications related to urinary retention 04/19/2023 0239 by Kizzie Bane, RN Outcome: Progressing 04/19/2023 0239 by Kizzie Bane, RN Outcome: Progressing   Problem: Pain Managment: Goal: General experience of comfort will improve and/or be controlled 04/19/2023 0239 by Kizzie Bane, RN Outcome: Progressing 04/19/2023 0239 by Kizzie Bane, RN Outcome: Progressing   Problem: Safety: Goal: Ability to remain free from injury will improve 04/19/2023 0239 by Kizzie Bane, RN Outcome: Progressing 04/19/2023 0239 by Kizzie Bane, RN Outcome: Progressing

## 2023-04-20 DIAGNOSIS — N492 Inflammatory disorders of scrotum: Secondary | ICD-10-CM | POA: Diagnosis not present

## 2023-04-20 LAB — GLUCOSE, CAPILLARY
Glucose-Capillary: 120 mg/dL — ABNORMAL HIGH (ref 70–99)
Glucose-Capillary: 127 mg/dL — ABNORMAL HIGH (ref 70–99)

## 2023-04-20 MED ORDER — AMOXICILLIN-POT CLAVULANATE 875-125 MG PO TABS: 1.0000 | ORAL_TABLET | Freq: Two times a day (BID) | ORAL | Status: DC

## 2023-04-20 MED ORDER — AMOXICILLIN-POT CLAVULANATE 875-125 MG PO TABS
1.0000 | ORAL_TABLET | Freq: Two times a day (BID) | ORAL | Status: DC
  Administered 2023-04-20: 1 via ORAL
  Filled 2023-04-20: qty 1

## 2023-04-20 MED ORDER — LEVOFLOXACIN 500 MG PO TABS: 500.0000 mg | ORAL_TABLET | Freq: Every day | ORAL | Status: DC

## 2023-04-20 MED ORDER — AMOXICILLIN-POT CLAVULANATE 875-125 MG PO TABS: 1.0000 | ORAL_TABLET | Freq: Two times a day (BID) | ORAL | 0 refills | Status: AC

## 2023-04-20 MED ORDER — DOXYCYCLINE HYCLATE 100 MG PO TABS
100.0000 mg | ORAL_TABLET | Freq: Two times a day (BID) | ORAL | Status: DC
  Filled 2023-04-20: qty 1

## 2023-04-20 MED ORDER — DOXYCYCLINE HYCLATE 100 MG PO TABS: 100.0000 mg | ORAL_TABLET | Freq: Two times a day (BID) | ORAL | 0 refills | Status: AC

## 2023-04-20 NOTE — Plan of Care (Signed)

## 2023-04-20 NOTE — Plan of Care (Signed)
  Problem: Coping: Goal: Level of anxiety will decrease Outcome: Progressing   Problem: Pain Managment: Goal: General experience of comfort will improve and/or be controlled Outcome: Progressing   Problem: Safety: Goal: Ability to remain free from injury will improve Outcome: Progressing

## 2023-04-20 NOTE — Discharge Summary (Signed)
Physician Discharge Summary  Corey Ward YQM:[id number] DOB: 1950/11/16 DOA: 04/17/2023  PCP: Corey Ran, MD  Admit date: 04/17/2023 Discharge date: 04/20/2023  Admitted From: home Disposition:  home  Recommendations for Outpatient Follow-up:  Follow up with PCP in 1-2 weeks Please obtain BMP/CBC in one week   Home Health:no Equipment/Devices:none Discharge Condition:Stable CODE STATUS:Full Diet recommendation: Heart Healthy  Brief/Interim Summary: 73 y.o. male past medical history of gout hyperlipidemia diabetes mellitus type 2 with a last A1c of 5.9 on Mounjaro had a recent right and left inguinal hernia repair comes in for left scrotal pain swelling erythema and fever.  Scrotal Doppler was negative for torsion. being treated for sepsis secondary to acute infectious epididymitis. Scrotal Doppler was negative for testicular torsion. Urology was consulted who recommended conservative management.  Discharge Diagnoses:  Principal Problem:   Cellulitis, scrotum Active Problems:   Drug-induced weight loss   Controlled type 2 diabetes mellitus without complication, without long-term current use of insulin (HCC)   Sepsis (HCC)   Epididymitis, left   Epididymo-orchitis, acute  Sepsis secondary to left epididymo-orchitis: Started empirically on IV Rocephin Flagyl and Doxy. He defervesced, scrotal Doppler was negative for testicular torsion. Blood cultures and urine culture remain negative to date. Urology recommended conservative management follow-up with them as an outpatient. He will continue Augmentin and Doxy for 7 days and outpatient. Scrotal pain was improved as well as swelling.  Diabetes mellitus type 2 without complications: No change made to his medication continue current regimen.  Hypokalemia: Replete orally now resolved.  Hypovolemic hyponatremia: Resolved with IV fluids.  Elevated bilirubin: Likely due to infectious etiology now resolved.  Drug-induced  weight loss: Continue Ozempic like as an outpatient.  Normocytic anemia: Follow-up PCP as an outpatient.  Discharge Instructions  Discharge Instructions     Diet - low sodium heart healthy   Complete by: As directed    Increase activity slowly   Complete by: As directed       Allergies as of 04/20/2023       Reactions   Evolocumab Other (See Comments)   Repatha =- non-responder = DID NOT WORK         Medication List     TAKE these medications    allopurinol 300 MG tablet Commonly known as: ZYLOPRIM Take 300 mg by mouth at bedtime.   amoxicillin-clavulanate 875-125 MG tablet Commonly known as: AUGMENTIN Take 1 tablet by mouth every 12 (twelve) hours for 7 days.   colchicine 0.6 MG tablet Take 0.6 mg by mouth daily as needed (gout).   doxycycline 100 MG tablet Commonly known as: VIBRA-TABS Take 1 tablet (100 mg total) by mouth every 12 (twelve) hours for 7 days.   ezetimibe-simvastatin 10-40 MG tablet Commonly known as: VYTORIN Take 1 tablet by mouth at bedtime.   finasteride 5 MG tablet Commonly known as: PROSCAR Take 5 mg by mouth every evening.   FreeStyle Libre 3 Sensor Misc Inject 1 Device into the skin every 14 (fourteen) days.   melatonin 1 MG Tabs tablet Take 1 mg by mouth at bedtime as needed (for sleep).   metFORMIN 500 MG tablet Commonly known as: GLUCOPHAGE Take 500 mg by mouth 2 (two) times daily with a meal.   methocarbamol 750 MG tablet Commonly known as: Robaxin-750 Take 1 tablet (750 mg total) by mouth every 6 (six) hours as needed for muscle spasms.   Mounjaro 7.5 MG/0.5ML Pen Generic drug: tirzepatide Inject 7.5 mg into the skin every 7 (seven) days.  What changed: Another medication with the same name was changed. Make sure you understand how and when to take each.   Mounjaro 2.5 MG/0.5ML Pen Generic drug: tirzepatide Inject 2.5 mg into the skin once a week. What changed: when to take this   oxyCODONE-acetaminophen 5-325 MG  tablet Commonly known as: Percocet Take 1 tablet by mouth every 4 (four) hours as needed for severe pain (pain score 7-10).   PRESCRIPTION MEDICATION See admin instructions. CPAP- At bedtime and during naptime(s)   sildenafil 20 MG tablet Commonly known as: REVATIO Take 60-100 mg by mouth daily as needed (erectile dysfunction.).   tamsulosin 0.4 MG Caps capsule Commonly known as: FLOMAX Take 0.4 mg by mouth in the morning and at bedtime.   Tylenol 325 MG tablet Generic drug: acetaminophen Take 325-650 mg by mouth every 8 (eight) hours as needed for mild pain (pain score 1-3), headache or fever.        Follow-up Information     [name], MD Follow up.   Specialty: Urology Why: Office will call with appointment Contact information: [address] [place] [address] [phone]                Allergies  Allergen Reactions   Evolocumab Other (See Comments)    Repatha =- non-responder = DID NOT WORK     Consultations: Urology   Procedures/Studies: US SCROTUM W/DOPPLER Result Date: 04/17/2023 CLINICAL DATA:  Testicle pain and swelling, recent bilateral inguinal hernia repair EXAM: SCROTAL ULTRASOUND DOPPLER ULTRASOUND OF THE TESTICLES TECHNIQUE: Complete ultrasound examination of the testicles, epididymis, and other scrotal structures was performed. Color and spectral Doppler ultrasound were also utilized to evaluate blood flow to the testicles. COMPARISON:  CT 04/17/2023 FINDINGS: Right testicle Measurements: 5.2 x 2.3 x 3.2 cm. No mass or microlithiasis visualized. Scrotal wall thickening. Heterogenous echogenic material within the right inguinal region/superior to the right testis, this measures 4.9 x 4.8 x 3.5 cm. Left testicle Measurements: 5 x 3.8 x 3.7 cm. No mass or microlithiasis visualized. Right epididymis:  Not well seen Left epididymis:  Hypervascular.  No mass Hydrocele: Moderate complex left hydrocele with septations and echogenic material.  Small right hydrocele. Varicocele:  Small right varicocele Pulsed Doppler interrogation of both testes demonstrates normal low resistance arterial and venous waveforms bilaterally. IMPRESSION: 1. Negative for testicular torsion. 2. Scrotal wall thickening. Heterogenous echogenic material within the distal right inguinal region/superior to the right testis measuring up to 4.9 cm, this may correspond to CT demonstrated fat and fluid hernia and potential cord edema. 3. Moderate complex left hydrocele with septations and echogenic material, differential considerations include hematocele or pyocele. 4. Small right varicocele. 5. Hypervascular left epididymis, question epididymitis. Electronically Signed   By: Jasmine Pang M.D.   On: 04/17/2023 16:42   CT ABDOMEN PELVIS W CONTRAST Result Date: 04/17/2023 CLINICAL DATA:  One day history of left testicular pain and swelling with fever and dizziness. Recent laparoscopic bilateral inguinal hernia repair on 03/23/2023. EXAM: CT ABDOMEN AND PELVIS WITH CONTRAST TECHNIQUE: Multidetector CT imaging of the abdomen and pelvis was performed using the standard protocol following bolus administration of intravenous contrast. RADIATION DOSE REDUCTION: This exam was performed according to the departmental dose-optimization program which includes automated exposure control, adjustment of the mA and/or kV according to patient size and/or use of iterative reconstruction technique. CONTRAST:  30mL OMNIPAQUE IOHEXOL 300 MG/ML SOLN, OMNIPAQUE IOHEXOL 300 MG/ML SOLN COMPARISON:  None Available. FINDINGS: Lower chest: No focal consolidation or pulmonary  nodule in the lung bases. No pleural effusion or pneumothorax demonstrated. Partially imaged heart size is normal. Hepatobiliary: No focal hepatic lesions. No intra or extrahepatic biliary ductal dilation. Normal gallbladder. Pancreas: No focal lesions or main ductal dilation. Spleen: Normal in size without focal abnormality.  Adrenals/Urinary Tract: No adrenal nodules. No suspicious renal mass, calculi or hydronephrosis. No focal bladder wall thickening. Metallic radiodensity in the region of the urachus may be postsurgical. Stomach/Bowel: Small hiatal hernia. Normal appearance of the stomach. No evidence of bowel wall thickening, distention, or inflammatory changes. Colonic diverticulosis without acute diverticulitis. Normal appendix. Vascular/Lymphatic: Aortic atherosclerosis. No enlarged abdominal or pelvic lymph nodes. Reproductive: Rounded mild hyperdensity in the posterior bladder is favored to reflect prostatomegaly with median lobe hypertrophy. Asymmetric thickened appearance of the left epididymis with left hydrocele. Suspected asymmetric left scrotal soft tissue thickening. Other: No fluid collection or free air. Trace fluid within the right lower quadrant/right inguinal region. Musculoskeletal: No acute or abnormal lytic or blastic osseous lesions. Multilevel degenerative changes of the partially imaged thoracic and lumbar spine. Right hip arthroplasty. Small fat and ascites containing right inguinal hernia. Surgical clips along bilateral anterior lower abdominal wall. IMPRESSION: 1. Asymmetric thickened appearance of the left epididymis with left hydrocele and suspected asymmetric left scrotal soft tissue thickening, which may reflect epididymitis. Recommend further evaluation with scrotal ultrasound. 2. Small fat and ascites containing right inguinal hernia. 3.  Aortic Atherosclerosis (ICD10-I70.0). Electronically Signed   By: Agustin Cree M.D.   On: 04/17/2023 13:25   DG Chest 2 View Result Date: 04/17/2023 CLINICAL DATA:  Fever, dizziness. EXAM: CHEST - 2 VIEW COMPARISON:  None Available. FINDINGS: The heart size and mediastinal contours are within normal limits. Both lungs are clear. The visualized skeletal structures are unremarkable. IMPRESSION: No active cardiopulmonary disease. Electronically Signed   By: Lupita Raider M.D.   On: 04/17/2023 10:31   (Echo, Carotid, EGD, Colonoscopy, ERCP)    Subjective: Feels great swelling is down.  Discharge Exam: Vitals:   04/19/23 2037 04/20/23 0632  BP: 121/63 117/69  Pulse: 73 68  Resp: 18 18  Temp: 98.2 F (36.8 C) 98.9 F (37.2 C)  SpO2: 98% 97%   Vitals:   04/19/23 0616 04/19/23 1315 04/19/23 2037 04/20/23 0632  BP: (!) 86/48 115/65 121/63 117/69  Pulse: 71 70 73 68  Resp: 16 16 18 18   Temp: 98.7 F (37.1 C) 98.2 F (36.8 C) 98.2 F (36.8 C) 98.9 F (37.2 C)  TempSrc:   Oral Oral  SpO2: 95% 100% 98% 97%  Weight:      Height:        General: Pt is alert, awake, not in acute distress Cardiovascular: RRR, S1/S2 +, no rubs, no gallops Respiratory: CTA bilaterally, no wheezing, no rhonchi Abdominal: Soft, NT, ND, bowel sounds + Extremities: no edema, no cyanosis    The results of significant diagnostics from this hospitalization (including imaging, microbiology, ancillary and laboratory) are listed below for reference.     Microbiology: Recent Results (from the past 240 hours)  Urine Culture (for pregnant, neutropenic or urologic patients or patients with an indwelling urinary catheter)     Status: None   Collection Time: 04/17/23  9:09 AM   Specimen: Urine, Clean Catch  Result Value Ref Range Status   Specimen Description   Final    URINE, CLEAN CATCH Performed at [facility], [address], [place], [place] [address]    Special Requests   Final  NONE Performed at [facility], [address] [place], [place] [address]    Culture   Final    NO GROWTH Performed at [facility], [address] [place], [place] [address]    Report Status 04/18/2023 FINAL  Final  Culture, blood (Routine x 2)     Status: None (Preliminary result)   Collection Time: 04/17/23  9:45 AM   Specimen: BLOOD  Result Value Ref Range Status   Specimen Description   Final    BLOOD BLOOD LEFT  HAND Performed at [facility], [address] [place], [place] [address]    Special Requests   Final    BOTTLES DRAWN AEROBIC AND ANAEROBIC Blood Culture adequate volume Performed at [facility], [address] [place], [place] [address]    Culture   Final    NO GROWTH 3 DAYS Performed at [facility], [address] [place], [place] [address]    Report Status PENDING  Incomplete  Culture, blood (Routine x 2)     Status: None (Preliminary result)   Collection Time: 04/17/23  9:45 AM   Specimen: BLOOD  Result Value Ref Range Status   Specimen Description   Final    BLOOD RIGHT ANTECUBITAL Performed at [facility], [address], [place], [place] [address]    Special Requests   Final    BOTTLES DRAWN AEROBIC AND ANAEROBIC Blood Culture adequate volume Performed at [facility], [address], [place], [place] [address]    Culture   Final    NO GROWTH 3 DAYS Performed at [facility], [address] [place], [place] [address]    Report Status PENDING  Incomplete     Labs: BNP (last 3 results) No results for input(s): "BNP" in the last 8760 hours. Basic Metabolic Panel: Recent Labs  Lab 04/17/23 0909 04/18/23 0332  NA 134* 137  K 3.3* 4.0  CL 102 104  CO2 19* 23  GLUCOSE 151* 121*  BUN 18 15  CREATININE 1.00 0.98  CALCIUM 9.0 8.6*   Liver Function Tests: Recent Labs  Lab 04/17/23 0909 04/18/23 0332  AST 14* 18  ALT 10 8  ALKPHOS 67 53  BILITOT 1.3* 0.9  PROT 7.5 6.6  ALBUMIN 4.2 3.3*   No results for input(s): "LIPASE", "AMYLASE" in the last 168 hours. No results for input(s): "AMMONIA" in the last 168 hours. CBC: Recent Labs  Lab 04/17/23 0909 04/18/23 0332 04/19/23 0936  WBC 20.8* 29.1* 19.8*  HGB 12.4* 11.0* 9.9*  HCT 38.6* 34.0* 30.8*  MCV 91.9 94.2 93.1  PLT 247 219 221   Cardiac Enzymes: No results for input(s): "CKTOTAL", "CKMB", "CKMBINDEX",  "TROPONINI" in the last 168 hours. BNP: Invalid input(s): "POCBNP" CBG: Recent Labs  Lab 04/19/23 0807 04/19/23 1134 04/19/23 1629 04/19/23 2201 04/20/23 0721  GLUCAP 108* 99 168* 130* 120*   D-Dimer No results for input(s): "DDIMER" in the last 72 hours. Hgb A1c No results for input(s): "HGBA1C" in the last 72 hours. Lipid Profile No results for input(s): "CHOL", "HDL", "LDLCALC", "TRIG", "CHOLHDL", "LDLDIRECT" in the last 72 hours. Thyroid function studies No results for input(s): "TSH", "T4TOTAL", "T3FREE", "THYROIDAB" in the last 72 hours.  Invalid input(s): "FREET3" Anemia work up No results for input(s): "VITAMINB12", "FOLATE", "FERRITIN", "TIBC", "IRON", "RETICCTPCT" in the last 72 hours. Urinalysis    Component Value Date/Time   COLORURINE YELLOW 04/17/2023 0909   APPEARANCEUR  HAZY (A) 04/17/2023 0909   LABSPEC 1.017 04/17/2023 0909   PHURINE 5.0 04/17/2023 0909   GLUCOSEU NEGATIVE 04/17/2023 0909   HGBUR MODERATE (A) 04/17/2023 0909   BILIRUBINUR NEGATIVE 04/17/2023 0909   KETONESUR 20 (A) 04/17/2023 0909   PROTEINUR NEGATIVE 04/17/2023 0909   UROBILINOGEN 0.2 07/09/2010 0920   NITRITE NEGATIVE 04/17/2023 0909   LEUKOCYTESUR NEGATIVE 04/17/2023 0909   Sepsis Labs Recent Labs  Lab 04/17/23 0909 04/18/23 0332 04/19/23 0936  WBC 20.8* 29.1* 19.8*   Microbiology Recent Results (from the past 240 hours)  Urine Culture (for pregnant, neutropenic or urologic patients or patients with an indwelling urinary catheter)     Status: None   Collection Time: 04/17/23  9:09 AM   Specimen: Urine, Clean Catch  Result Value Ref Range Status   Specimen Description   Final    URINE, CLEAN CATCH Performed at [facility], [address] [place], [place] [address]    Special Requests   Final    NONE Performed at [facility], [address] [place], [place] [address]    Culture   Final    NO GROWTH Performed at [facility], [address] [place], [place] [address]    Report Status 04/18/2023 FINAL  Final  Culture, blood (Routine x 2)     Status: None (Preliminary result)   Collection Time: 04/17/23  9:45 AM   Specimen: BLOOD  Result Value Ref Range Status   Specimen Description   Final    BLOOD BLOOD LEFT HAND Performed at [facility], [address] [place], [place] [address]    Special Requests   Final    BOTTLES DRAWN AEROBIC AND ANAEROBIC Blood Culture adequate volume Performed at [facility], [address], [place], [place] [address]    Culture   Final    NO GROWTH 3 DAYS Performed at [facility], [address] [place], [place] [address]    Report Status PENDING  Incomplete  Culture, blood (Routine x 2)     Status: None (Preliminary result)   Collection Time: 04/17/23  9:45 AM   Specimen: BLOOD  Result Value Ref Range Status   Specimen Description   Final    BLOOD RIGHT ANTECUBITAL Performed at [facility], [address], [place], [place] [address]    Special Requests   Final    BOTTLES DRAWN AEROBIC AND ANAEROBIC Blood Culture adequate volume Performed at [facility], [address] [place], [place] [address]    Culture   Final    NO GROWTH 3 DAYS Performed at [facility], [address], [place], [place] [address]    Report Status PENDING  Incomplete     Time coordinating discharge: Over 35 minutes  SIGNED:   Marinda Elk, MD  [facility] 04/20/2023, 10:25 AM Pager   If 7PM-7AM, please contact night-coverage [email or web address] Password [email or web address]

## 2023-04-20 NOTE — Progress Notes (Signed)
     Subjective: First time meeting [name].  He reports that the swelling may have gone down slightly but the pain and pressure has continued to improve daily  Objective: Vital signs in last 24 hours: Temp:  [98.2 F (36.8 C)-98.9 F (37.2 C)] 98.9 F (37.2 C) (02/03 0632) Pulse Rate:  [68-73] 68 (02/03 0632) Resp:  [16-18] 18 (02/03 0632) BP: (115-121)/(63-69) 117/69 (02/03 0632) SpO2:  [97 %-100 %] 97 % (02/03 0632) FiO2 (%):  [21 %] 21 % (02/02 2341)  Assessment/Plan: # Epididymoorchitis Significant swelling and induration of the left testicle.  He tolerated physical examination well this morning and appears to be on the right track.  Per discharge summary, patient has been provided 7 days of Augmentin and 7 days of doxycycline.  He will likely need a longer course of treatment and this and will follow-up in clinic closely with Dr. Mena Goes for reassessment.  Patient reports pending surgery for bladder outlet obstruction, which will help prevent future instances of infection.  Continue Flomax and finasteride.  Intake/Output from previous day: 02/02 0701 - 02/03 0700 In: 1000 [P.O.:900; IV Piggyback:100] Out: -   Intake/Output this shift: Total I/O In: 120 [P.O.:120] Out: -   Physical Exam:  General: Alert and oriented CV: No cyanosis Lungs: equal chest rise Gu: Significantly swollen and indurated left testicle.  Otherwise unremarkable examination.  Lab Results: Recent Labs    04/18/23 0332 04/19/23 0936  HGB 11.0* 9.9*  HCT 34.0* 30.8*   BMET Recent Labs    04/18/23 0332  NA 137  K 4.0  CL 104  CO2 23  GLUCOSE 121*  BUN 15  CREATININE 0.98  CALCIUM 8.6*     Studies/Results: No results found.    LOS: 3 days   Elmon Kirschner, NP [facility] Pager: [phone]  04/20/2023, 12:40 PM

## 2023-04-20 NOTE — Progress Notes (Signed)
Discharge instructions reviewed with patient. Pt has no questions regarding discharge instructions. Wife coming at 5 pm to give ride to pt.

## 2023-04-22 LAB — CULTURE, BLOOD (ROUTINE X 2)
Culture: NO GROWTH
Culture: NO GROWTH
Special Requests: ADEQUATE
Special Requests: ADEQUATE

## 2023-04-24 DIAGNOSIS — N453 Epididymo-orchitis: Secondary | ICD-10-CM | POA: Diagnosis not present

## 2023-04-24 IMAGING — CT CT CHEST W/O CM
2 of 5 series · 15 of 36 positions shown, 18 images · non-contrast
Comparison: Chest CT dated 08/09/2020.

CLINICAL DATA: Follow-up pulmonary nodule.



[Series 4: chest 2.00 br40 s3 · coronal · 0.70mm/px · 3 of 170 slices shown]
[im 34/170  lung]
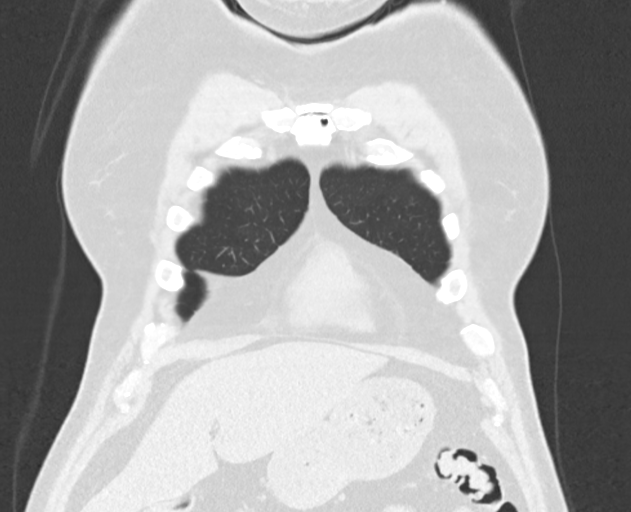
[im 68/170  lung]
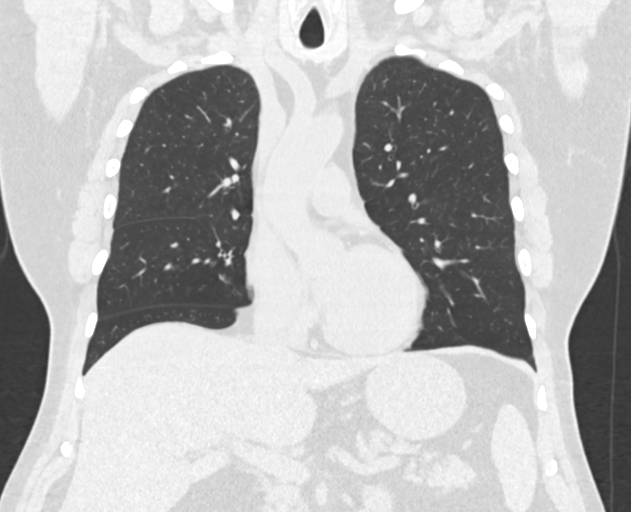
[im 102/170  lung]
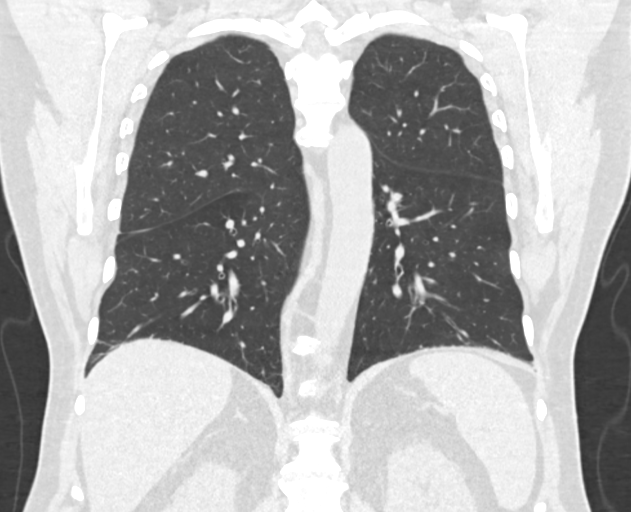

[Series 10: chest 1.00 br40 s3 super d · axial · 0.79mm/px · z∈[+1378,+1689]mm · 12 of 449 slices shown, 15 images]
[im 30/449  mediastinal]
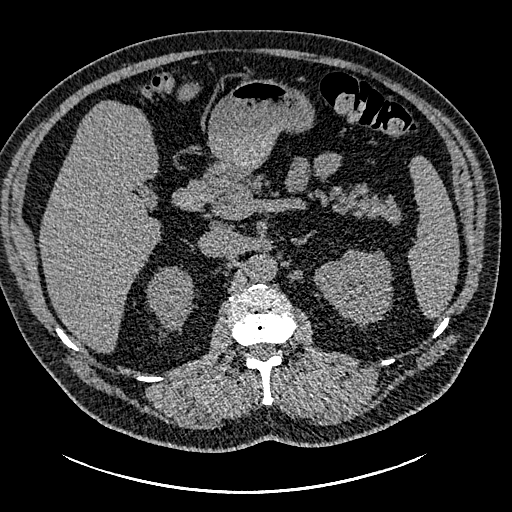
[im 30/449  lung]
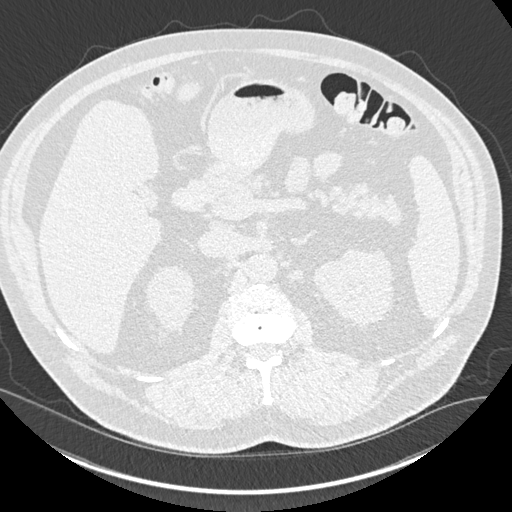
[im 60/449  lung]
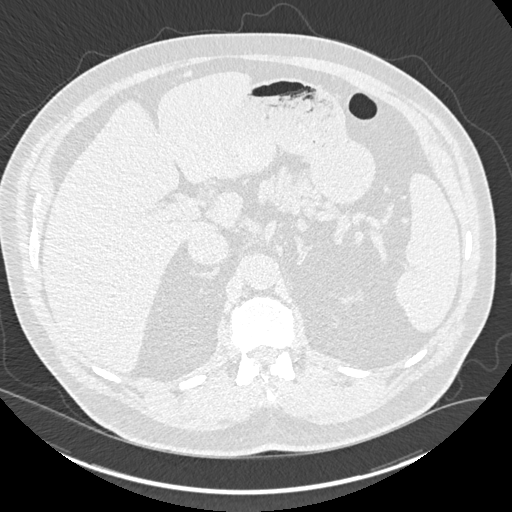
[im 90/449  lung]
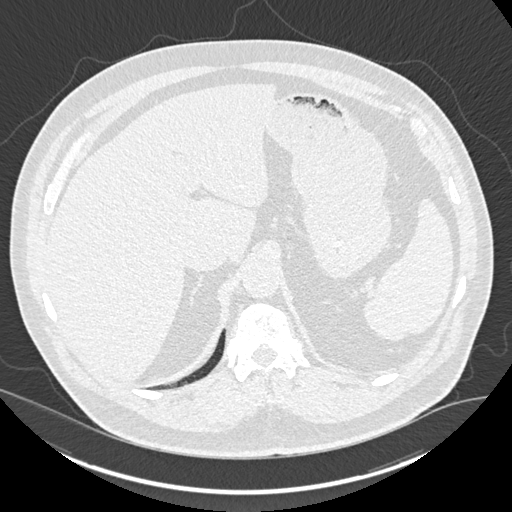
[im 150/449  lung]
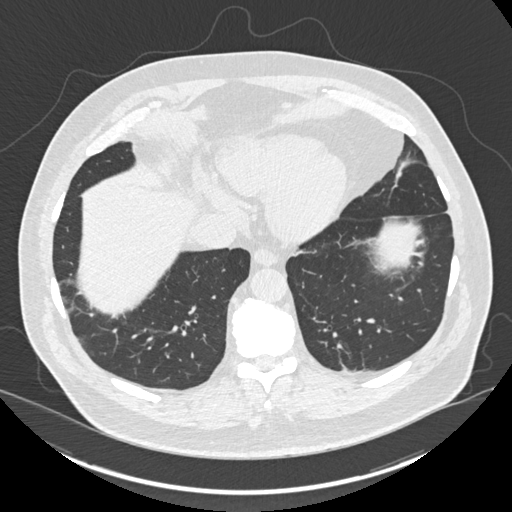
[im 180/449  mediastinal]
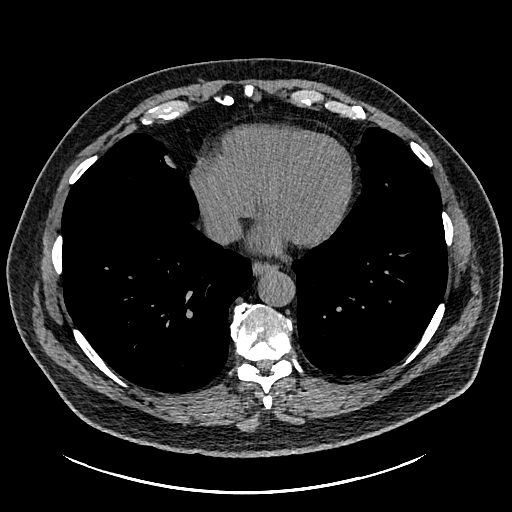
[im 180/449  lung]
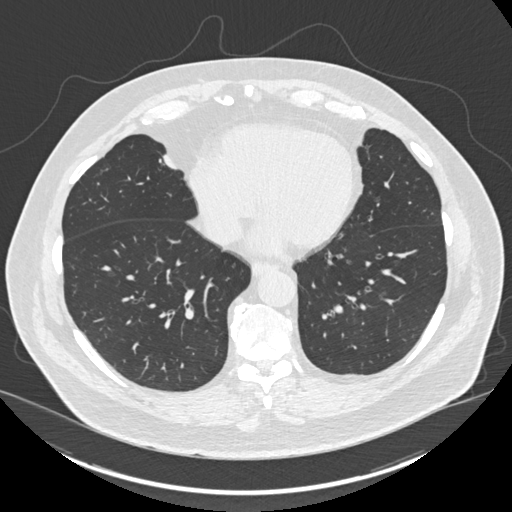
[im 210/449  lung]
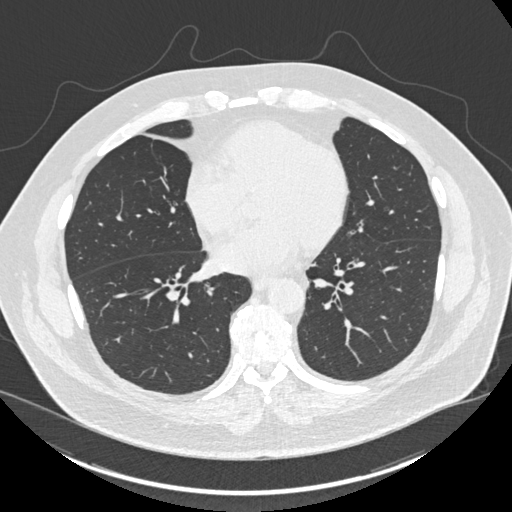
[im 239/449  lung]
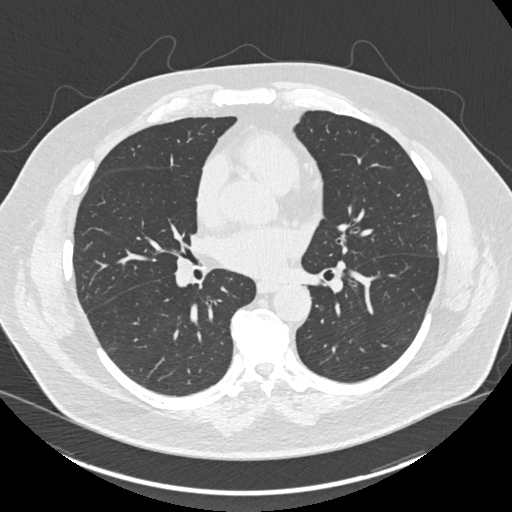
[im 269/449  lung]
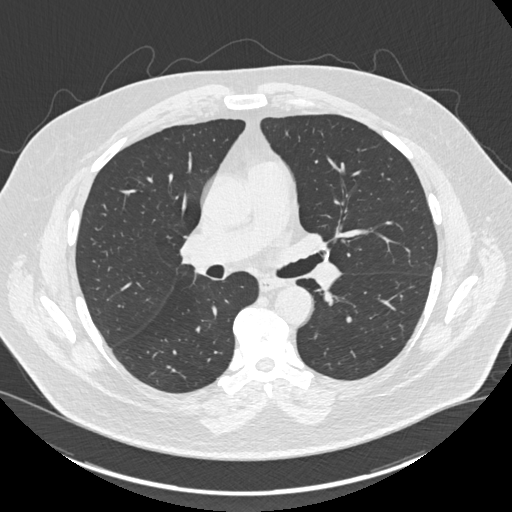
[im 299/449  mediastinal]
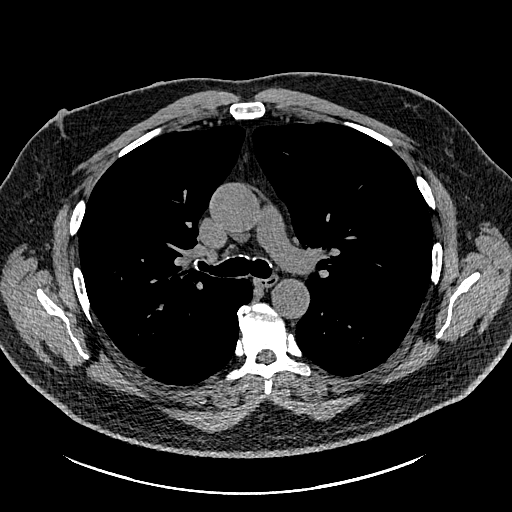
[im 299/449  lung]
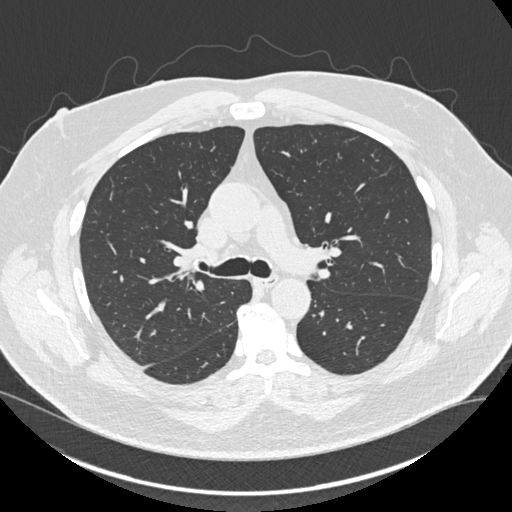
[im 359/449  lung]
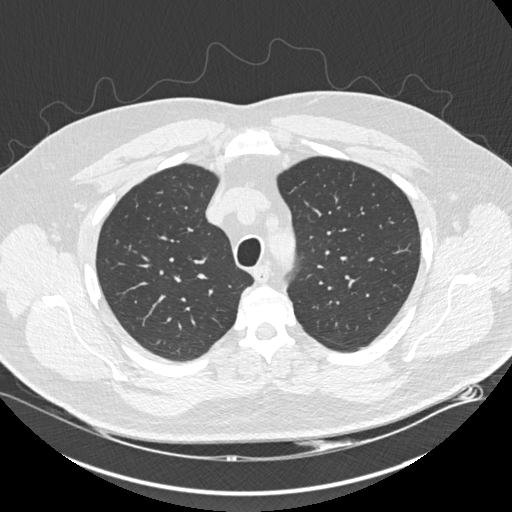
[im 389/449  lung]
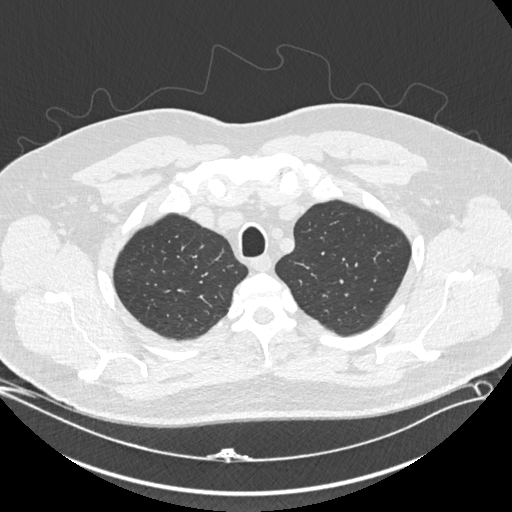
[im 419/449  lung]
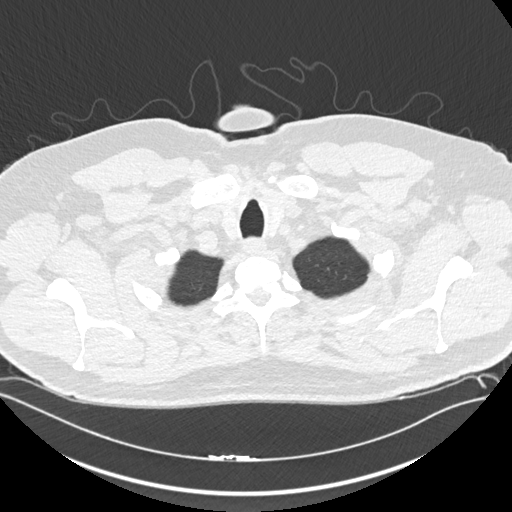

[15 of 36 positions shown; findings below may reference images not displayed]

FINDINGS: Evaluation of this exam is limited in the absence of intravenous
contrast.

Cardiovascular: There is no cardiomegaly or pericardial effusion.
There is coronary vascular calcification of the LAD. Mild
atherosclerotic calcification of the thoracic aorta. No aneurysmal
dilatation. The central pulmonary arteries are grossly unremarkable.

Mediastinum/Nodes: No hilar or mediastinal adenopathy. The esophagus
and the thyroid gland are grossly unremarkable. No mediastinal fluid
collection.

Lungs/Pleura: Similar appearance of a faint left upper lobe nodule
with solid component measuring 4 mm. There are bibasilar linear
atelectasis/scarring. No focal consolidation, pleural effusion, or
pneumothorax. The central airways are patent.

Upper Abdomen: Fatty liver.

Musculoskeletal: Degenerative changes of the spine. No acute osseous
pathology.
IMPRESSION: 1. No acute intrathoracic pathology.
2. Similar appearance of a faint left upper lobe nodule with solid
component measuring 4 mm. Follow-up as per recommendation of prior
CT.
3. Fatty liver.
4. Coronary vascular calcification of the LAD.
5. Aortic Atherosclerosis ([id number]).

## 2023-04-25 DIAGNOSIS — E119 Type 2 diabetes mellitus without complications: Secondary | ICD-10-CM | POA: Diagnosis not present

## 2023-04-25 DIAGNOSIS — H43393 Other vitreous opacities, bilateral: Secondary | ICD-10-CM | POA: Diagnosis not present

## 2023-04-26 DIAGNOSIS — G4733 Obstructive sleep apnea (adult) (pediatric): Secondary | ICD-10-CM | POA: Diagnosis not present

## 2023-04-27 ENCOUNTER — Other Ambulatory Visit (HOSPITAL_BASED_OUTPATIENT_CLINIC_OR_DEPARTMENT_OTHER): Payer: Self-pay

## 2023-04-27 MED ORDER — MOUNJARO 5 MG/0.5ML ~~LOC~~ SOAJ
5.0000 mg | SUBCUTANEOUS | 0 refills | Status: DC
Start: 2023-04-27 — End: 2023-06-01
  Filled 2023-04-27: qty 2, 28d supply, fill #0

## 2023-05-24 DIAGNOSIS — G4733 Obstructive sleep apnea (adult) (pediatric): Secondary | ICD-10-CM | POA: Diagnosis not present

## 2023-05-25 DIAGNOSIS — R8271 Bacteriuria: Secondary | ICD-10-CM | POA: Diagnosis not present

## 2023-05-25 DIAGNOSIS — N401 Enlarged prostate with lower urinary tract symptoms: Secondary | ICD-10-CM | POA: Diagnosis not present

## 2023-05-25 DIAGNOSIS — R3912 Poor urinary stream: Secondary | ICD-10-CM | POA: Diagnosis not present

## 2023-05-29 ENCOUNTER — Other Ambulatory Visit (HOSPITAL_BASED_OUTPATIENT_CLINIC_OR_DEPARTMENT_OTHER): Payer: Self-pay

## 2023-06-01 ENCOUNTER — Other Ambulatory Visit (HOSPITAL_BASED_OUTPATIENT_CLINIC_OR_DEPARTMENT_OTHER): Payer: Self-pay

## 2023-06-01 DIAGNOSIS — N401 Enlarged prostate with lower urinary tract symptoms: Secondary | ICD-10-CM | POA: Diagnosis not present

## 2023-06-01 DIAGNOSIS — R350 Frequency of micturition: Secondary | ICD-10-CM | POA: Diagnosis not present

## 2023-06-01 DIAGNOSIS — R351 Nocturia: Secondary | ICD-10-CM | POA: Diagnosis not present

## 2023-06-01 DIAGNOSIS — R3912 Poor urinary stream: Secondary | ICD-10-CM | POA: Diagnosis not present

## 2023-06-01 MED ORDER — MOUNJARO 5 MG/0.5ML ~~LOC~~ SOAJ
5.0000 mg | SUBCUTANEOUS | 0 refills | Status: DC
  Filled 2023-06-01: qty 2, 28d supply, fill #0

## 2023-06-09 DIAGNOSIS — G4733 Obstructive sleep apnea (adult) (pediatric): Secondary | ICD-10-CM | POA: Diagnosis not present

## 2023-06-10 ENCOUNTER — Other Ambulatory Visit: Payer: Self-pay | Admitting: Urology

## 2023-06-11 ENCOUNTER — Other Ambulatory Visit (HOSPITAL_BASED_OUTPATIENT_CLINIC_OR_DEPARTMENT_OTHER): Payer: Self-pay

## 2023-06-16 DIAGNOSIS — I2510 Atherosclerotic heart disease of native coronary artery without angina pectoris: Secondary | ICD-10-CM | POA: Diagnosis not present

## 2023-06-16 DIAGNOSIS — E119 Type 2 diabetes mellitus without complications: Secondary | ICD-10-CM | POA: Diagnosis not present

## 2023-06-16 DIAGNOSIS — G4730 Sleep apnea, unspecified: Secondary | ICD-10-CM | POA: Diagnosis not present

## 2023-06-16 DIAGNOSIS — D649 Anemia, unspecified: Secondary | ICD-10-CM | POA: Diagnosis not present

## 2023-06-16 DIAGNOSIS — E785 Hyperlipidemia, unspecified: Secondary | ICD-10-CM | POA: Diagnosis not present

## 2023-06-24 DIAGNOSIS — G4733 Obstructive sleep apnea (adult) (pediatric): Secondary | ICD-10-CM | POA: Diagnosis not present

## 2023-07-24 DIAGNOSIS — G4733 Obstructive sleep apnea (adult) (pediatric): Secondary | ICD-10-CM | POA: Diagnosis not present

## 2023-08-12 ENCOUNTER — Other Ambulatory Visit (HOSPITAL_BASED_OUTPATIENT_CLINIC_OR_DEPARTMENT_OTHER): Payer: Self-pay

## 2023-08-14 ENCOUNTER — Other Ambulatory Visit (HOSPITAL_BASED_OUTPATIENT_CLINIC_OR_DEPARTMENT_OTHER): Payer: Self-pay

## 2023-08-14 MED ORDER — MOUNJARO 7.5 MG/0.5ML ~~LOC~~ SOAJ
7.5000 mg | SUBCUTANEOUS | 3 refills | Status: AC
  Filled 2023-08-14: qty 6, 84d supply, fill #0
  Filled 2023-11-03: qty 6, 84d supply, fill #1
  Filled 2024-01-26: qty 6, 84d supply, fill #2

## 2023-08-24 DIAGNOSIS — G4733 Obstructive sleep apnea (adult) (pediatric): Secondary | ICD-10-CM | POA: Diagnosis not present

## 2023-09-10 NOTE — Patient Instructions (Addendum)
 SURGICAL WAITING ROOM VISITATION  Patients having surgery or a procedure may have no more than 2 support people in the waiting area - these visitors may rotate.    Children under the age of 35 must have an adult with them who is not the patient.  Visitors with respiratory illnesses are discouraged from visiting and should remain at home.  If the patient needs to stay at the hospital during part of their recovery, the visitor guidelines for inpatient rooms apply. Pre-op nurse will coordinate an appropriate time for 1 support person to accompany patient in pre-op.  This support person may not rotate.    Please refer to the [facility] website for the visitor guidelines for Inpatients (after your surgery is over and you are in a regular room).    Your procedure is scheduled on: 09/22/23   Report to [facility] Main Entrance    Report to admitting at 7:00 AM   Call this number if you have problems the morning of surgery [phone]   Do not eat food or drink liquids :After Midnight.          If you have questions, please contact your surgeon's office.   FOLLOW BOWEL PREP AND ANY ADDITIONAL PRE OP INSTRUCTIONS YOU RECEIVED FROM YOUR SURGEON'S OFFICE!!!     Oral Hygiene is also important to reduce your risk of infection.                                    Remember - BRUSH YOUR TEETH THE MORNING OF SURGERY WITH YOUR REGULAR TOOTHPASTE  DENTURES WILL BE REMOVED PRIOR TO SURGERY PLEASE DO NOT APPLY Poly grip OR ADHESIVES!!!   Stop all vitamins and herbal supplements 7 days before surgery.   Take these medicines the morning of surgery with A SIP OF WATER: Allopurinol , Tamsulosin    DO NOT TAKE ANY ORAL DIABETIC MEDICATIONS DAY OF YOUR SURGERY  How to Manage Your Diabetes Before and After Surgery  Why is it important to control my blood sugar before and after surgery? Improving blood sugar levels before and after surgery helps healing and can limit problems. A way of  improving blood sugar control is eating a healthy diet by:  Eating less sugar and carbohydrates  Increasing activity/exercise  Talking with your doctor about reaching your blood sugar goals High blood sugars (greater than 180 mg/dL) can raise your risk of infections and slow your recovery, so you will need to focus on controlling your diabetes during the weeks before surgery. Make sure that the doctor who takes care of your diabetes knows about your planned surgery including the date and location.  How do I manage my blood sugar before surgery? Check your blood sugar at least 4 times a day, starting 2 days before surgery, to make sure that the level is not too high or low. Check your blood sugar the morning of your surgery when you wake up and every 2 hours until you get to the Short Stay unit. If your blood sugar is less than 70 mg/dL, you will need to treat for low blood sugar: Do not take insulin . Treat a low blood sugar (less than 70 mg/dL) with  cup of clear juice (cranberry or apple), 4 glucose tablets, OR glucose gel. Recheck blood sugar in 15 minutes after treatment (to make sure it is greater than 70 mg/dL). If your blood sugar is not greater than 70  mg/dL on recheck, call [phone] for further instructions. Report your blood sugar to the short stay nurse when you get to Short Stay.  If you are admitted to the hospital after surgery: Your blood sugar will be checked by the staff and you will probably be given insulin  after surgery (instead of oral diabetes medicines) to make sure you have good blood sugar levels. The goal for blood sugar control after surgery is 80-180 mg/dL.   WHAT DO I DO ABOUT MY DIABETES MEDICATION?  Do not take oral diabetes medicines (pills) the morning of surgery.  Do not take  Mounjaro  after 09/14/23.  THE DAY BEFORE SURGERY, take Metformin as prescribed.     THE MORNING OF SURGERY, do not take Metformin.   DO NOT TAKE THE FOLLOWING 7 DAYS PRIOR TO  SURGERY: Ozempic, Wegovy, Rybelsus (Semaglutide), Byetta (exenatide), Bydureon (exenatide ER), Victoza, Saxenda (liraglutide), or Trulicity (dulaglutide) Mounjaro  (Tirzepatide ) Adlyxin (Lixisenatide), Polyethylene Glycol Loxenatide.  Reviewed and Endorsed by [facility] Patient Education Committee, August 2015  Bring CPAP mask and tubing day of surgery.                              You may not have any metal on your body including jewelry, and body piercing             Do not wear lotions, powders, cologne, or deodorant              Men may shave face and neck.   Do not bring valuables to the hospital. [facility] [place] IS NOT             RESPONSIBLE   FOR VALUABLES.   Contacts, glasses, dentures or bridgework may not be worn into surgery.  DO NOT BRING YOUR HOME MEDICATIONS TO THE HOSPITAL. PHARMACY WILL DISPENSE MEDICATIONS LISTED ON YOUR MEDICATION LIST TO YOU DURING YOUR ADMISSION IN THE HOSPITAL!    Patients discharged on the day of surgery will not be allowed to drive home.  Someone NEEDS to stay with you for the first 24 hours after anesthesia.   Special Instructions: Bring a copy of your healthcare power of attorney and living will documents the day of surgery if you haven't scanned them before.              Please read over the following fact sheets you were given: IF YOU HAVE QUESTIONS ABOUT YOUR PRE-OP INSTRUCTIONS PLEASE CALL [phone]   If you received a COVID test during your pre-op visit  it is requested that you wear a mask when out in public, stay away from anyone that may not be feeling well and notify your surgeon if you develop symptoms. If you test positive for Covid or have been in contact with anyone that has tested positive in the last 10 days please notify you surgeon.     - Preparing for Surgery Before surgery, you can play an important role.  Because skin is not sterile, your skin needs to be as free of germs as possible.  You can reduce the  number of germs on your skin by washing with CHG (chlorahexidine gluconate) soap before surgery.  CHG is an antiseptic cleaner which kills germs and bonds with the skin to continue killing germs even after washing. Please DO NOT use if you have an allergy to CHG or antibacterial soaps.  If your skin becomes reddened/irritated stop using the CHG and inform your  nurse when you arrive at Short Stay. Do not shave (including legs and underarms) for at least 48 hours prior to the first CHG shower.  You may shave your face/neck.  Please follow these instructions carefully:  1.  Shower with CHG Soap the night before surgery and the  morning of surgery.  2.  If you choose to wash your hair, wash your hair first as usual with your normal  shampoo.  3.  After you shampoo, rinse your hair and body thoroughly to remove the shampoo.                             4.  Use CHG as you would any other liquid soap.  You can apply chg directly to the skin and wash.  Gently with a scrungie or clean washcloth.  5.  Apply the CHG Soap to your body ONLY FROM THE NECK DOWN.   Do   not use on face/ open                           Wound or open sores. Avoid contact with eyes, ears mouth and   genitals (private parts).                       Wash face,  Genitals (private parts) with your normal soap.             6.  Wash thoroughly, paying special attention to the area where your    surgery  will be performed.  7.  Thoroughly rinse your body with warm water from the neck down.  8.  DO NOT shower/wash with your normal soap after using and rinsing off the CHG Soap.                9.  Pat yourself dry with a clean towel.            10.  Wear clean pajamas.            11.  Place clean sheets on your bed the night of your first shower and do not  sleep with pets. Day of Surgery : Do not apply any lotions/deodorants the morning of surgery.  Please wear clean clothes to the hospital/surgery center.  FAILURE TO FOLLOW THESE  INSTRUCTIONS MAY RESULT IN THE CANCELLATION OF YOUR SURGERY  PATIENT SIGNATURE_________________________________  NURSE SIGNATURE__________________________________  ________________________________________________________________________

## 2023-09-10 NOTE — Progress Notes (Addendum)
 COVID Vaccine Completed:  Date of COVID positive in last 90 days:  PCP - Oneil Neth, MD Cardiologist - n/a Neurologist- Dedra Gores, MD  Chest x-ray - 04/17/23 Epic EKG - 04/17/23 Epic Stress Test - long time ago per pt, normal ECHO - n/a Cardiac Cath - n/a Pacemaker/ICD device last checked: n/a Spinal Cord Stimulator: n/a  Bowel Prep - no  Sleep Study - yes CPAP - yes , every night   Fasting Blood Sugar - 110-120 Checks Blood Sugar  CGM libre  Last dose of GLP1 agonist-  Mounjaro , takes Mondays GLP1 instructions:  Do not take after  09/14/23. Last dose 09/14/23   Last dose of SGLT-2 inhibitors-  N/A SGLT-2 instructions:  Do not take after     Blood Thinner Instructions:  Last dose: n/a  Time: Aspirin  Instructions: Last Dose:  Activity level: Can go up a flight of stairs and perform activities of daily living without stopping and without symptoms of chest pain or shortness of breath.  Anesthesia review: atherosclerotic heart disease, DM2, OSA,  asthma   Patient denies shortness of breath, fever, cough and chest pain at PAT appointment  Patient verbalized understanding of instructions that were given to them at the PAT appointment. Patient was also instructed that they will need to review over the PAT instructions again at home before surgery.

## 2023-09-11 ENCOUNTER — Other Ambulatory Visit: Payer: Self-pay

## 2023-09-11 ENCOUNTER — Encounter (HOSPITAL_COMMUNITY): Payer: Self-pay

## 2023-09-11 ENCOUNTER — Encounter (HOSPITAL_COMMUNITY)
Admission: RE | Admit: 2023-09-11 | Discharge: 2023-09-11 | Disposition: A | Discharge status: Home / Self Care | Source: Ambulatory Visit | Attending: Urology

## 2023-09-11 VITALS — BP 97/78 | HR 55 | Temp 97.7°F | Resp 16 | Ht 76.0 in | Wt 236.0 lb

## 2023-09-11 DIAGNOSIS — R338 Other retention of urine: Secondary | ICD-10-CM | POA: Insufficient documentation

## 2023-09-11 DIAGNOSIS — J45909 Unspecified asthma, uncomplicated: Secondary | ICD-10-CM | POA: Diagnosis not present

## 2023-09-11 DIAGNOSIS — G4733 Obstructive sleep apnea (adult) (pediatric): Secondary | ICD-10-CM | POA: Diagnosis not present

## 2023-09-11 DIAGNOSIS — I700 Atherosclerosis of aorta: Secondary | ICD-10-CM | POA: Diagnosis not present

## 2023-09-11 DIAGNOSIS — I2510 Atherosclerotic heart disease of native coronary artery without angina pectoris: Secondary | ICD-10-CM | POA: Diagnosis not present

## 2023-09-11 DIAGNOSIS — Z01812 Encounter for preprocedural laboratory examination: Secondary | ICD-10-CM | POA: Insufficient documentation

## 2023-09-11 DIAGNOSIS — R918 Other nonspecific abnormal finding of lung field: Secondary | ICD-10-CM | POA: Insufficient documentation

## 2023-09-11 DIAGNOSIS — Z7985 Long-term (current) use of injectable non-insulin antidiabetic drugs: Secondary | ICD-10-CM | POA: Insufficient documentation

## 2023-09-11 DIAGNOSIS — N401 Enlarged prostate with lower urinary tract symptoms: Secondary | ICD-10-CM | POA: Diagnosis not present

## 2023-09-11 DIAGNOSIS — Z7984 Long term (current) use of oral hypoglycemic drugs: Secondary | ICD-10-CM | POA: Diagnosis not present

## 2023-09-11 DIAGNOSIS — E119 Type 2 diabetes mellitus without complications: Secondary | ICD-10-CM | POA: Insufficient documentation

## 2023-09-11 HISTORY — DX: Sepsis, unspecified organism: A41.9

## 2023-09-11 LAB — CBC
HCT: 39.1 % (ref 39.0–52.0)
Hemoglobin: 12.7 g/dL — ABNORMAL LOW (ref 13.0–17.0)
MCH: 30 pg (ref 26.0–34.0)
MCHC: 32.5 g/dL (ref 30.0–36.0)
MCV: 92.2 fL (ref 80.0–100.0)
Platelets: 207 K/uL (ref 150–400)
RBC: 4.24 MIL/uL (ref 4.22–5.81)
RDW: 13.6 % (ref 11.5–15.5)
WBC: 6.6 K/uL (ref 4.0–10.5)
nRBC: 0 % (ref 0.0–0.2)

## 2023-09-11 LAB — BASIC METABOLIC PANEL WITH GFR
Anion gap: 10 (ref 5–15)
BUN: 15 mg/dL (ref 8–23)
CO2: 23 mmol/L (ref 22–32)
Calcium: 9.1 mg/dL (ref 8.9–10.3)
Chloride: 105 mmol/L (ref 98–111)
Creatinine, Ser: 0.78 mg/dL (ref 0.61–1.24)
GFR, Estimated: >60 mL/min
Glucose, Bld: 106 mg/dL — ABNORMAL HIGH (ref 70–99)
Potassium: 4.1 mmol/L (ref 3.5–5.1)
Sodium: 138 mmol/L (ref 135–145)

## 2023-09-11 LAB — HEMOGLOBIN A1C
Hgb A1c MFr Bld: 5.7 % — ABNORMAL HIGH (ref 4.8–5.6)
Mean Plasma Glucose: 116.89 mg/dL

## 2023-09-11 LAB — GLUCOSE, CAPILLARY: Glucose-Capillary: 111 mg/dL — ABNORMAL HIGH (ref 70–99)

## 2023-09-14 NOTE — Progress Notes (Signed)
 Anesthesia Chart Review   Case: [id number] Date/Time: 09/22/23 0900   Procedure: ABLATION, PROSTATE, TRANSURETHRAL, USING WATERJET   Anesthesia type: General   Diagnosis: Hyperplasia of prostate with urinary retention [N40.1, R33.8]   Pre-op diagnosis: BENIGN PROSTATIC HYPERPLASIA   Location: [facility] PROCEDURE [facility] / [facility]   Surgeons: Nieves Cough, MD       DISCUSSION:73 y.o. never smoker with h/o PONV, OSA, asthma, DM II, BPH scheduled for above procedure 09/22/2023 with Dr. Cough Nieves.   Patient follows with PCP. Last seen on 11/20/22. He has hx of CAD (by coronary calcium scan) and pulmonary nodules that he is getting annual CT Chests. Last one was on 08/28/22 and appeared stable. He denies CP/SOB at PAT visit and has good functional status. He is on medical therapy for CAD. Diabetes and BP are controlled.   S/p bilateral hernia repair 03/23/2023 with no anesthesia complications noted.  VS: BP 97/78   Pulse (!) 55   Temp 36.5 C (Oral)   Resp 16   Ht 6' 4 (1.93 m)   Wt 107 kg   SpO2 97%   BMI 28.73 kg/m   PROVIDERS: Shayne Anes, MD is PCP    LABS: Labs reviewed: Acceptable for surgery. (all labs ordered are listed, but only abnormal results are displayed)  Labs Reviewed  HEMOGLOBIN A1C - Abnormal; Notable for the following components:      Result Value   Hgb A1c MFr Bld 5.7 (*)    All other components within normal limits  BASIC METABOLIC PANEL WITH GFR - Abnormal; Notable for the following components:   Glucose, Bld 106 (*)    All other components within normal limits  CBC - Abnormal; Notable for the following components:   Hemoglobin 12.7 (*)    All other components within normal limits  GLUCOSE, CAPILLARY - Abnormal; Notable for the following components:   Glucose-Capillary 111 (*)    All other components within normal limits     IMAGES: CT Chest 08/28/22   IMPRESSION: 1. Stable 5 mm central left upper lobe subsolid nodule dating back to 01/31/2020. No  new or enlarging nodules. Recommend continued annual follow-up until there is 5 years of stability. This recommendation follows the consensus statement: Guidelines for Management of incidental Pulmonary Nodules Detected on CT Images: From the Fleischner Society 2017; Radiology 2017; 284:228-243. 2. Aortic Atherosclerosis (ICD10-I70.0). Coronary artery calcifications. Assessment for potential risk factor modification, dietary therapy or pharmacologic therapy may be warranted, if clinically indicated.  EKG:   CV: CT Coronary Calcium 01/31/20:   IMPRESSION: 1. Coronary artery calcium score is 82.1 and this is at percentile 47 for patients of the same age, gender and ethnicity. 2. Few scattered nodular densities in the lungs. 6 mm ground-glass opacity in the left upper lobe is indeterminate. Initial follow-up with CT at 6-12 months is recommended to confirm persistence. If persistent, repeat CT is recommended every 2 years until 5 years of stability has been established. This recommendation follows the consensus statement: Guidelines for Management of Incidental Pulmonary Nodules Detected on CT Images: From the Fleischner Society 2017; Radiology 2017; 284:228-243. Past Medical History:  Diagnosis Date   Arthritis    Asthma    When younger no problem now   BPH (benign prostatic hyperplasia)    Diabetes mellitus without complication (HCC)    Gout    Hyperlipidemia    OSA (obstructive sleep apnea)    PONV (postoperative nausea and vomiting)    Sepsis (HCC)    Sleep  apnea    Urinary retention     Past Surgical History:  Procedure Laterality Date   calcium deposits removed from ankle     Left   CARPAL TUNNEL RELEASE Right 12/21/2020   Procedure: RIGHT CARPAL TUNNEL RELEASE;  Surgeon: Murrell Kuba, MD;  Location:  SURGERY CENTER;  Service: Orthopedics;  Laterality: Right;   INGUINAL HERNIA REPAIR Right    INGUINAL HERNIA REPAIR Bilateral 03/23/2023   Procedure:  LAPAROSCOPIC BILATERAL INGUINAL HERNIA REPAIR WITH MESH;  Surgeon: Stechschulte, Deward PARAS, MD;  Location: [facility];  Service: General;  Laterality: Bilateral;   KNEE SURGERY     arthroscopic  surg   REPLACEMENT TOTAL KNEE Bilateral    TENDON REPAIR Right    foot   TONSILLECTOMY     TOTAL HIP ARTHROPLASTY Right 09/08/2018   Procedure: TOTAL HIP ARTHROPLASTY ANTERIOR APPROACH;  Surgeon: Melodi Lerner, MD;  Location: [facility];  Service: Orthopedics;  Laterality: Right;     MEDICATIONS:  allopurinol  (ZYLOPRIM ) 300 MG tablet   colchicine  0.6 MG tablet   Continuous Glucose Sensor (FREESTYLE LIBRE 3 SENSOR) MISC   ezetimibe -simvastatin  (VYTORIN ) 10-40 MG per tablet   finasteride  (PROSCAR ) 5 MG tablet   MAGNESIUM  PO   melatonin 1 MG TABS tablet   metFORMIN (GLUCOPHAGE) 500 MG tablet   methocarbamol  (ROBAXIN -750) 750 MG tablet   oxyCODONE -acetaminophen  (PERCOCET) 5-325 MG tablet   PRESCRIPTION MEDICATION   sildenafil (REVATIO) 20 MG tablet   tamsulosin  (FLOMAX ) 0.4 MG CAPS   tirzepatide  (MOUNJARO ) 2.5 MG/0.5ML Pen   tirzepatide  (MOUNJARO ) 5 MG/0.5ML Pen   tirzepatide  (MOUNJARO ) 7.5 MG/0.5ML Pen   No current facility-administered medications for this encounter.     Harlene Hoots Ward, PA-C [facility]-Surgical [facility] [phone]

## 2023-09-22 ENCOUNTER — Ambulatory Visit (HOSPITAL_COMMUNITY): Payer: Self-pay | Admitting: Physician Assistant

## 2023-09-22 ENCOUNTER — Other Ambulatory Visit: Payer: Self-pay

## 2023-09-22 ENCOUNTER — Encounter (HOSPITAL_COMMUNITY): Payer: Self-pay | Admitting: Urology

## 2023-09-22 ENCOUNTER — Encounter (HOSPITAL_COMMUNITY)
Admission: RE | Disposition: A | Discharge status: Home / Self Care | Payer: Self-pay | Source: Ambulatory Visit | Attending: Urology

## 2023-09-22 ENCOUNTER — Ambulatory Visit (HOSPITAL_COMMUNITY)
Admission: RE | Admit: 2023-09-22 | Discharge: 2023-09-22 | Disposition: A | Discharge status: Home / Self Care | Source: Ambulatory Visit | Attending: Urology | Admitting: Urology

## 2023-09-22 ENCOUNTER — Ambulatory Visit (HOSPITAL_COMMUNITY): Admitting: Anesthesiology

## 2023-09-22 DIAGNOSIS — N401 Enlarged prostate with lower urinary tract symptoms: Secondary | ICD-10-CM | POA: Insufficient documentation

## 2023-09-22 DIAGNOSIS — Z7984 Long term (current) use of oral hypoglycemic drugs: Secondary | ICD-10-CM | POA: Diagnosis not present

## 2023-09-22 DIAGNOSIS — G4733 Obstructive sleep apnea (adult) (pediatric): Secondary | ICD-10-CM | POA: Diagnosis not present

## 2023-09-22 DIAGNOSIS — J45909 Unspecified asthma, uncomplicated: Secondary | ICD-10-CM | POA: Diagnosis not present

## 2023-09-22 DIAGNOSIS — N411 Chronic prostatitis: Secondary | ICD-10-CM | POA: Diagnosis not present

## 2023-09-22 DIAGNOSIS — R350 Frequency of micturition: Secondary | ICD-10-CM | POA: Diagnosis not present

## 2023-09-22 DIAGNOSIS — N138 Other obstructive and reflux uropathy: Secondary | ICD-10-CM

## 2023-09-22 DIAGNOSIS — Z7985 Long-term (current) use of injectable non-insulin antidiabetic drugs: Secondary | ICD-10-CM | POA: Diagnosis not present

## 2023-09-22 DIAGNOSIS — R3912 Poor urinary stream: Secondary | ICD-10-CM | POA: Diagnosis not present

## 2023-09-22 DIAGNOSIS — E119 Type 2 diabetes mellitus without complications: Secondary | ICD-10-CM | POA: Insufficient documentation

## 2023-09-22 DIAGNOSIS — N400 Enlarged prostate without lower urinary tract symptoms: Secondary | ICD-10-CM | POA: Diagnosis not present

## 2023-09-22 LAB — GLUCOSE, CAPILLARY
Glucose-Capillary: 117 mg/dL — ABNORMAL HIGH (ref 70–99)
Glucose-Capillary: 117 mg/dL — ABNORMAL HIGH (ref 70–99)

## 2023-09-22 SURGERY — ABLATION, PROSTATE, TRANSURETHRAL, USING WATERJET
Anesthesia: General | Site: Prostate

## 2023-09-22 MED ORDER — DEXMEDETOMIDINE HCL IN NACL 200 MCG/50ML IV SOLN
INTRAVENOUS | Status: DC | PRN
  Administered 2023-09-22: 4 ug via INTRAVENOUS
  Administered 2023-09-22: 8 ug via INTRAVENOUS

## 2023-09-22 MED ORDER — SODIUM CHLORIDE 0.9 % IR SOLN
Status: DC | PRN
  Administered 2023-09-22: 6000 mL
  Administered 2023-09-22: 6000 mL via INTRAVESICAL
  Administered 2023-09-22: 3000 mL
  Administered 2023-09-22 (×2): 6000 mL via INTRAVESICAL

## 2023-09-22 MED ORDER — GLYCOPYRROLATE 0.2 MG/ML IJ SOLN
INTRAMUSCULAR | Status: DC | PRN
  Administered 2023-09-22: .2 mg via INTRAVENOUS

## 2023-09-22 MED ORDER — INSULIN ASPART 100 UNIT/ML IJ SOLN: 0.0000 [IU] | INTRAMUSCULAR | Status: DC | PRN

## 2023-09-22 MED ORDER — ONDANSETRON HCL 4 MG/2ML IJ SOLN
INTRAMUSCULAR | Status: AC
  Filled 2023-09-22: qty 2

## 2023-09-22 MED ORDER — ROCURONIUM BROMIDE 10 MG/ML (PF) SYRINGE
PREFILLED_SYRINGE | INTRAVENOUS | Status: AC
  Filled 2023-09-22: qty 10

## 2023-09-22 MED ORDER — DEXAMETHASONE SODIUM PHOSPHATE 10 MG/ML IJ SOLN
INTRAMUSCULAR | Status: DC | PRN
  Administered 2023-09-22: 4 mg via INTRAVENOUS

## 2023-09-22 MED ORDER — PHENYLEPHRINE HCL (PRESSORS) 10 MG/ML IV SOLN
INTRAVENOUS | Status: AC
  Filled 2023-09-22: qty 1

## 2023-09-22 MED ORDER — SODIUM CHLORIDE 0.9 % IV SOLN
2.0000 g | INTRAVENOUS | Status: AC
  Administered 2023-09-22: 2 g via INTRAVENOUS
  Filled 2023-09-22: qty 20

## 2023-09-22 MED ORDER — LACTATED RINGERS IV SOLN: INTRAVENOUS | Status: DC

## 2023-09-22 MED ORDER — FENTANYL CITRATE (PF) 100 MCG/2ML IJ SOLN
INTRAMUSCULAR | Status: DC | PRN
  Administered 2023-09-22: 25 ug via INTRAVENOUS
  Administered 2023-09-22: 50 ug via INTRAVENOUS
  Administered 2023-09-22: 25 ug via INTRAVENOUS

## 2023-09-22 MED ORDER — FENTANYL CITRATE PF 50 MCG/ML IJ SOSY
PREFILLED_SYRINGE | INTRAMUSCULAR | Status: AC
  Filled 2023-09-22: qty 2

## 2023-09-22 MED ORDER — ORAL CARE MOUTH RINSE: 15.0000 mL | Freq: Once | OROMUCOSAL | Status: AC

## 2023-09-22 MED ORDER — FENTANYL CITRATE (PF) 100 MCG/2ML IJ SOLN
INTRAMUSCULAR | Status: AC
  Filled 2023-09-22: qty 2

## 2023-09-22 MED ORDER — LIDOCAINE HCL (CARDIAC) PF 100 MG/5ML IV SOSY
PREFILLED_SYRINGE | INTRAVENOUS | Status: DC | PRN
  Administered 2023-09-22: 50 mg via INTRAVENOUS

## 2023-09-22 MED ORDER — ONDANSETRON HCL 4 MG/2ML IJ SOLN
INTRAMUSCULAR | Status: DC | PRN
Start: 2023-09-22 — End: 2023-09-22
  Administered 2023-09-22: 4 mg via INTRAVENOUS

## 2023-09-22 MED ORDER — ACETAMINOPHEN 500 MG PO TABS
1000.0000 mg | ORAL_TABLET | Freq: Once | ORAL | Status: AC
  Administered 2023-09-22: 1000 mg via ORAL
  Filled 2023-09-22: qty 2

## 2023-09-22 MED ORDER — 0.9 % SODIUM CHLORIDE (POUR BTL) OPTIME
TOPICAL | Status: DC | PRN
  Administered 2023-09-22: 1000 mL

## 2023-09-22 MED ORDER — PHENYLEPHRINE HCL (PRESSORS) 10 MG/ML IV SOLN
INTRAVENOUS | Status: AC
Start: 2023-09-22 — End: 2023-09-22
  Filled 2023-09-22: qty 1

## 2023-09-22 MED ORDER — CEPHALEXIN 500 MG PO CAPS
500.0000 mg | ORAL_CAPSULE | Freq: Two times a day (BID) | ORAL | 0 refills | Status: AC
Start: 2023-09-22 — End: ?

## 2023-09-22 MED ORDER — TRANEXAMIC ACID-NACL 1000-0.7 MG/100ML-% IV SOLN
INTRAVENOUS | Status: AC
  Filled 2023-09-22: qty 100

## 2023-09-22 MED ORDER — EPHEDRINE SULFATE (PRESSORS) 50 MG/ML IJ SOLN
INTRAMUSCULAR | Status: DC | PRN
Start: 2023-09-22 — End: 2023-09-22
  Administered 2023-09-22: 5 mg via INTRAVENOUS

## 2023-09-22 MED ORDER — AMISULPRIDE (ANTIEMETIC) 5 MG/2ML IV SOLN: 10.0000 mg | Freq: Once | INTRAVENOUS | Status: DC | PRN

## 2023-09-22 MED ORDER — FENTANYL CITRATE PF 50 MCG/ML IJ SOSY
25.0000 ug | PREFILLED_SYRINGE | INTRAMUSCULAR | Status: DC | PRN
  Administered 2023-09-22: 50 ug via INTRAVENOUS

## 2023-09-22 MED ORDER — LIDOCAINE HCL (PF) 2 % IJ SOLN
INTRAMUSCULAR | Status: AC
  Filled 2023-09-22: qty 5

## 2023-09-22 MED ORDER — TRANEXAMIC ACID-NACL 1000-0.7 MG/100ML-% IV SOLN
1000.0000 mg | INTRAVENOUS | Status: AC
  Administered 2023-09-22 (×2): 1000 mg via INTRAVENOUS
  Filled 2023-09-22: qty 100

## 2023-09-22 MED ORDER — ONDANSETRON HCL 4 MG/2ML IJ SOLN: 4.0000 mg | Freq: Once | INTRAMUSCULAR | Status: DC | PRN

## 2023-09-22 MED ORDER — PROPOFOL 10 MG/ML IV BOLUS
INTRAVENOUS | Status: AC
  Filled 2023-09-22: qty 20

## 2023-09-22 MED ORDER — PROPOFOL 10 MG/ML IV BOLUS
INTRAVENOUS | Status: DC | PRN
  Administered 2023-09-22: 18 mg via INTRAVENOUS
  Administered 2023-09-22: 20 mg via INTRAVENOUS

## 2023-09-22 MED ORDER — EPHEDRINE 5 MG/ML INJ
INTRAVENOUS | Status: AC
  Filled 2023-09-22: qty 5

## 2023-09-22 MED ORDER — SUGAMMADEX SODIUM 200 MG/2ML IV SOLN
INTRAVENOUS | Status: DC | PRN
  Administered 2023-09-22: 200 mg via INTRAVENOUS

## 2023-09-22 MED ORDER — ROCURONIUM BROMIDE 100 MG/10ML IV SOLN
INTRAVENOUS | Status: DC | PRN
  Administered 2023-09-22: 10 mg via INTRAVENOUS
  Administered 2023-09-22: 20 mg via INTRAVENOUS
  Administered 2023-09-22: 60 mg via INTRAVENOUS

## 2023-09-22 MED ORDER — DEXAMETHASONE SODIUM PHOSPHATE 10 MG/ML IJ SOLN
INTRAMUSCULAR | Status: AC
  Filled 2023-09-22: qty 1

## 2023-09-22 MED ORDER — CHLORHEXIDINE GLUCONATE 0.12 % MT SOLN
15.0000 mL | Freq: Once | OROMUCOSAL | Status: AC
  Administered 2023-09-22: 15 mL via OROMUCOSAL

## 2023-09-22 SURGICAL SUPPLY — 27 items
BAG URINE DRAIN 2000ML AR STRL (UROLOGICAL SUPPLIES) ×2 IMPLANT
BAND RUBBER #18 3X1/16 STRL (MISCELLANEOUS) IMPLANT
CATH HEMA 3WAY 30CC 22FR COUDE (CATHETERS) IMPLANT
CATH HEMA 3WAY 30CC 24FR COUDE (CATHETERS) IMPLANT
COVER MAYO STAND STRL (DRAPES) ×2 IMPLANT
DRAPE FOOT SWITCH (DRAPES) ×2 IMPLANT
DRAPE SURG IRRIG POUCH 19X23 (DRAPES) IMPLANT
GEL ULTRASOUND 8.5O AQUASONIC (MISCELLANEOUS) ×2 IMPLANT
GLOVE SURG LX STRL 7.5 STRW (GLOVE) ×2 IMPLANT
GOWN STRL REUS W/ TWL XL LVL3 (GOWN DISPOSABLE) ×2 IMPLANT
GUIDEWIRE STR DUAL SENSOR (WIRE) IMPLANT
HANDPIECE AQUABEAM (MISCELLANEOUS) ×2 IMPLANT
HOLDER FOLEY CATH W/STRAP (MISCELLANEOUS) IMPLANT
KIT TURNOVER KIT A (KITS) ×2 IMPLANT
LOOP CUT BIPOLAR 24F LRG (ELECTROSURGICAL) IMPLANT
MANIFOLD NEPTUNE II (INSTRUMENTS) ×2 IMPLANT
MAT ABSORB FLUID 56X50 GRAY (MISCELLANEOUS) ×2 IMPLANT
PACK CYSTO (CUSTOM PROCEDURE TRAY) ×2 IMPLANT
PACK DRAPE AQUABEAM (MISCELLANEOUS) ×2 IMPLANT
PAD PREP 24X48 CUFFED NSTRL (MISCELLANEOUS) ×2 IMPLANT
PIN SAFETY STERILE (MISCELLANEOUS) IMPLANT
SYR 30ML LL (SYRINGE) ×2 IMPLANT
SYRINGE TOOMEY IRRIG 70ML (MISCELLANEOUS) ×4 IMPLANT
TOWEL OR 17X26 10 PK STRL BLUE (TOWEL DISPOSABLE) ×2 IMPLANT
TUBING CONNECTING 10 (TUBING) ×4 IMPLANT
TUBING UROLOGY SET (TUBING) ×2 IMPLANT
UNDERPAD 30X36 HEAVY ABSORB (UNDERPADS AND DIAPERS) ×2 IMPLANT

## 2023-09-22 NOTE — Transfer of Care (Signed)
 Immediate Anesthesia Transfer of Care Note  Patient: Corey Ward  Procedure(s) Performed: ABLATION, PROSTATE, TRANSURETHRAL, USING WATERJET (Prostate)  Patient Location: PACU  Anesthesia Type:General  Level of Consciousness: oriented, drowsy, and patient cooperative  Airway & Oxygen Therapy: Patient Spontanous Breathing and Patient connected to face mask oxygen  Post-op Assessment: Report given to RN and Post -op Vital signs reviewed and stable  Post vital signs: Reviewed and stable  Last Vitals:  Vitals Value Taken Time  BP 118/67 09/22/23 11:46  Temp    Pulse 50 09/22/23 11:48  Resp 14 09/22/23 11:48  SpO2 100 % 09/22/23 11:48  Vitals shown include unfiled device data.  Last Pain:  Vitals:   09/22/23 0723  TempSrc:   PainSc: 0-No pain         Complications: No notable events documented.

## 2023-09-22 NOTE — Op Note (Signed)
 Preoperative diagnosis: BPH with lower urinary tract symptoms, weak stream, frequency  Postoperative diagnosis: Same   Procedure: Robotic water jet ablation of the prostate   Surgeon: Nieves   Anesthesia: General   Indication for procedure:   Findings:  EUA-prostate about 50 g and smooth without hard area nodule Cystoscopy revealed lateral lobe hypertrophy with significant intravesical median and lateral lobe component left greater than right.  Description of procedure:  He was brought to the operating room and placed supine on the operating table.  After adequate anesthesia he was placed lithotomy position. Timeout was performed to confirm the patient and procedure. The TRUS Stepper was mounted to the Articulating Arm and secured to OR bed. The ultrasound probe was attached to the stepper. Exam under anesthesia was performed and the TRUS was inserted per rectum.  There was no resistance. The ultrasound probe was aligned, and confirmation made that the prostate is centered and aligned using both transverse and sagittal views. The bladder neck, verumontanum and the central/transition zones were identified.  Genitalia were prepped and draped in the usual sterile fashion. The 31F AQUABEAM Handpiece is inserted into the prostatic urethra and a complete cystoscopic evaluation was performed by inspecting the prostate, bladder, and identifying the location of the verumontanum/external sphincter. The AQUABEAM Handpiece was secured to the Handpiece Articulating Arm. Confirmed alignment of AQUABEAM Handpiece and TRUS Probe to be parallel and colinear. Confirmation that AQUABEAM nozzle is centered and anterior of the bladder neck or the median lobe. The cystoscope was then retracted but we were just proximal to the verumontanum and external sphincter and therefore planned a third pass. Prior to the third pass, the Aquabeam handpiece was retracted a few cm's to visualize the verumontanum and external sphincter  and the cystoscope tip was positioned just proximal to the external sphincter. Reconfirmed alignment of the TRUS probe with the AQUABEAM Handpiece and compression applied with TRUS probe. Horizontal alignment of the Handpiece waterjet nozzle was performed. The Aquablation treatment zones were planned utilizing real-time TRUS to visualize the contour of the prostate and the depth and radial angles of resection were defined in the transverse view. In the sagittal view, the AQUABEAM nozzle is identified and position registered with software. The treatment contours were then adjusted to conform to the intended resection margins. The median lobe, bladder neck and verumontanum were marked and confirmed in the treatment contour. The Aquablation Treatment was then started following the resection contour confirmed under ultrasound guidance. TOTAL AQUABLATION RESECTION TIME: First pass 4:19, second pass 3:51, third pass 4:21 Once Aquablation resection was complete the 24 French aqua beam handpiece was carefully removed.  The continuous-flow sheath with the visual obturator was passed and then the loop and handle.  The trigone and the ureteral orifices were identified.  Resection of some of the residual median lobe and intravesical tissue was done.  There was minimal tissue on the right and with just a bit starting about 7:00 on the patient's right side I was able to resect some of the median lobe over to then visualize the right ureteral orifice trigone and bladder neck.  The bladder neck was identified at 6:00 and this was taken up to 12:00 with resection of a generous amount of intravesical left median lobe and left lateral intravesical component.  At 1 point I went up to 2:00 and came back down on this left lobe.  After resecting the intravesical component I was able to ensure fulguration of the bladder neck and prostate for hemostasis.  Slight amount of anterior tissue was resected.  Similarly from 6:00 up to 12:00 the  right side of the bladder neck was identified by resecting some of the ablated tissue to identify the bladder neck and cauterize any bleeding.  Some anterior tissue on the right was resected.  This created excellent hemostasis.  All the chips were evacuated.  1 intravesical chip from the left side required resection in the bladder to remove it. Ureteral orifices again identified and noted to be normal without injury.  The scope was backed out and a 22 [place] hematuria catheter was placed with 30 cc in the balloon.  The balloon was seated at the bladder neck and it was irrigated on light traction and noted to be clear to pink.  He was hooked up to CBI.  He was cleaned up and placed supine.  Catheter was placed on traction.  He was awakened and taken to the cover room in stable condition.  Complications: None  Blood loss: 100 mL  Specimens: None  Drains: 22 French three-way hematuria catheter with 30 cc in the balloon  Disposition: Patient stable to PACU

## 2023-09-22 NOTE — Anesthesia Preprocedure Evaluation (Addendum)
 Anesthesia Evaluation  Patient identified by MRN, date of birth, ID band Patient awake    Reviewed: Allergy & Precautions, NPO status , Patient's Chart, lab work & pertinent test results  History of Anesthesia Complications (+) PONV and history of anesthetic complications  Airway Mallampati: II  TM Distance: >3 FB Neck ROM: Full    Dental no notable dental hx.    Pulmonary asthma , sleep apnea and Continuous Positive Airway Pressure Ventilation    Pulmonary exam normal        Cardiovascular negative cardio ROS Normal cardiovascular exam     Neuro/Psych negative neurological ROS  negative psych ROS   GI/Hepatic negative GI ROS, Neg liver ROS,,,  Endo/Other  diabetes, Oral Hypoglycemic Agents  Patient on GLP-1 Agonist  Renal/GU negative Renal ROS     Musculoskeletal  (+) Arthritis ,    Abdominal   Peds  Hematology negative hematology ROS (+)   Anesthesia Other Findings BENIGN PROSTATIC HYPERPLASIA  Reproductive/Obstetrics                              Anesthesia Physical Anesthesia Plan  ASA: 3  Anesthesia Plan: General   Post-op Pain Management:    Induction: Intravenous  PONV Risk Score and Plan: 3 and Ondansetron , Dexamethasone , Midazolam  and Treatment may vary due to age or medical condition  Airway Management Planned: Oral ETT  Additional Equipment:   Intra-op Plan:   Post-operative Plan: Extubation in OR  Informed Consent: I have reviewed the patients History and Physical, chart, labs and discussed the procedure including the risks, benefits and alternatives for the proposed anesthesia with the patient or authorized representative who has indicated his/her understanding and acceptance.     Dental advisory given  Plan Discussed with: CRNA  Anesthesia Plan Comments:          Anesthesia Quick Evaluation

## 2023-09-22 NOTE — H&P (Signed)
 H&P  Chief Complaint: BPH with LUTS   History of Present Illness: Corey Ward is a 73 year old male with a long history of BPH.  He has history of urinary retention.  He is on tamsulosin  and finasteride .  He was hoping for a more definitive procedure and to get off medication.  His recent PVR was 237.  Uroflow 13 cc/s.  Prostate ultrasound measured about a 35 g median lobe and a 60 g prostate.  January 2025 PSA was 2.1 (4.2).  Urine culture was negative. He does NOT want to preserve ejaculation.   Past Medical History:  Diagnosis Date   Arthritis    Asthma    When younger no problem now   BPH (benign prostatic hyperplasia)    Diabetes mellitus without complication (HCC)    Gout    Hyperlipidemia    OSA (obstructive sleep apnea)    PONV (postoperative nausea and vomiting)    Sepsis (HCC)    Sleep apnea    Urinary retention    Past Surgical History:  Procedure Laterality Date   calcium deposits removed from ankle     Left   CARPAL TUNNEL RELEASE Right 12/21/2020   Procedure: RIGHT CARPAL TUNNEL RELEASE;  Surgeon: Murrell Kuba, MD;  Location: [facility];  Service: Orthopedics;  Laterality: Right;   INGUINAL HERNIA REPAIR Right    INGUINAL HERNIA REPAIR Bilateral 03/23/2023   Procedure: LAPAROSCOPIC BILATERAL INGUINAL HERNIA REPAIR WITH MESH;  Surgeon: Stechschulte, Deward PARAS, MD;  Location: [facility];  Service: General;  Laterality: Bilateral;   KNEE SURGERY     arthroscopic  surg   REPLACEMENT TOTAL KNEE Bilateral    TENDON REPAIR Right    foot   TONSILLECTOMY     TOTAL HIP ARTHROPLASTY Right 09/08/2018   Procedure: TOTAL HIP ARTHROPLASTY ANTERIOR APPROACH;  Surgeon: Melodi Lerner, MD;  Location: [facility];  Service: Orthopedics;  Laterality: Right;     Home Medications:  Medications Prior to Admission  Medication Sig Dispense Refill Last Dose/Taking   allopurinol  (ZYLOPRIM ) 300 MG tablet Take 300 mg by mouth at bedtime.    Taking   colchicine  0.6 MG tablet Take 0.6  mg by mouth daily as needed (gout).   Taking As Needed   ezetimibe -simvastatin  (VYTORIN ) 10-40 MG per tablet Take 1 tablet by mouth at bedtime.   Taking   finasteride  (PROSCAR ) 5 MG tablet Take 5 mg by mouth every evening.    Taking   MAGNESIUM  PO Take 1 tablet by mouth daily.   Past Week   melatonin 1 MG TABS tablet Take 1 mg by mouth at bedtime as needed (for sleep).   Past Week   metFORMIN (GLUCOPHAGE) 500 MG tablet Take 500 mg by mouth 2 (two) times daily with a meal.   09/21/2023   tamsulosin  (FLOMAX ) 0.4 MG CAPS Take 0.4 mg by mouth in the morning and at bedtime.   Taking   tirzepatide  (MOUNJARO ) 7.5 MG/0.5ML Pen Inject 7.5 mg into the skin once a week. 6 mL 3 Taking   Continuous Glucose Sensor (FREESTYLE LIBRE 3 SENSOR) MISC Inject 1 Device into the skin every 14 (fourteen) days.      methocarbamol  (ROBAXIN -750) 750 MG tablet Take 1 tablet (750 mg total) by mouth every 6 (six) hours as needed for muscle spasms. (Patient not taking: Reported on 09/08/2023) 30 tablet 0 Not Taking   oxyCODONE -acetaminophen  (PERCOCET) 5-325 MG tablet Take 1 tablet by mouth every 4 (four) hours as needed for severe pain (pain  score 7-10). (Patient not taking: Reported on 09/08/2023) 20 tablet 0 Not Taking   PRESCRIPTION MEDICATION See admin instructions. CPAP- At bedtime and during naptime(s)      sildenafil (REVATIO) 20 MG tablet Take 60-100 mg by mouth daily as needed (erectile dysfunction.).      tirzepatide  (MOUNJARO ) 2.5 MG/0.5ML Pen Inject 2.5 mg into the skin once a week. (Patient not taking: Reported on 09/08/2023) 2 mL 0 Not Taking   tirzepatide  (MOUNJARO ) 5 MG/0.5ML Pen Inject 5 mg into the skin once a week. 2 mL 0    Allergies:  Allergies  Allergen Reactions   Evolocumab Other (See Comments)    Repatha =- non-responder = DID NOT WORK     Family History  Problem Relation Age of Onset   Heart disease Father    Colon cancer Neg Hx    Esophageal cancer Neg Hx    Rectal cancer Neg Hx    Stomach cancer  Neg Hx    Social History:  reports that he has never smoked. He has never used smokeless tobacco. He reports that he does not drink alcohol and does not use drugs.  ROS: A complete review of systems was performed.  All systems are negative except for pertinent findings as noted. Review of Systems  All other systems reviewed and are negative.    Physical Exam:  Vital signs in last 24 hours: Temp:  [99 F (37.2 C)] 99 F (37.2 C) (07/08 0704) Pulse Rate:  [63] 63 (07/08 0704) Resp:  [16] 16 (07/08 0704) BP: (138)/(75) 138/75 (07/08 0704) SpO2:  [98 %] 98 % (07/08 0704) Weight:  [107 kg] 107 kg (07/08 0723) General:  Alert and oriented, No acute distress HEENT: Normocephalic, atraumatic Neck: No JVD or lymphadenopathy Cardiovascular: Regular rate and rhythm Lungs: Regular rate and effort Abdomen: Soft, nontender, nondistended, no abdominal masses Back: No CVA tenderness Extremities: No edema Neurologic: Grossly intact GU: Penis circumcised and without mass or lesion.  Testicles descended bilaterally.  Left testicle has healed well from his prior orchitis without swelling or mass.  There is a small right hydrocele the right testicle palpably normal.  Scrotum appears normal.  Laboratory Data:  Results for orders placed or performed during the hospital encounter of 09/22/23 (from the past 24 hours)  Glucose, capillary     Status: Abnormal   Collection Time: 09/22/23  7:06 AM  Result Value Ref Range   Glucose-Capillary 117 (H) 70 - 99 mg/dL   Comment 1 Notify RN    No results found for this or any previous visit (from the past 240 hours). Creatinine: No results for input(s): CREATININE in the last 168 hours.  Impression/Assessment:  I discussed with the patient the nature, potential benefits, risks and alternatives to robotic water jet ablation of the prostate, including side effects of the proposed treatment, the likelihood of the patient achieving the goals of the procedure,  and any potential problems that might occur during the procedure or recuperation. Discussed orchitis risk as well among the others. All questions answered. Patient elects to proceed.    Donnice Brooks 09/22/2023

## 2023-09-22 NOTE — Anesthesia Procedure Notes (Signed)
 Procedure Name: Intubation Date/Time: 09/22/2023 9:38 AM  Performed by: Kathern Rollene LABOR, CRNAPre-anesthesia Checklist: Patient identified, Emergency Drugs available, Suction available and Patient being monitored Patient Re-evaluated:Patient Re-evaluated prior to induction Oxygen Delivery Method: Circle system utilized Preoxygenation: Pre-oxygenation with 100% oxygen Induction Type: IV induction Ventilation: Mask ventilation without difficulty Laryngoscope Size: Glidescope Grade View: Grade I Tube type: Oral Number of attempts: 1 Airway Equipment and Method: Oral airway, Video-laryngoscopy and Rigid stylet (grade 4 voew w/4 mac blade, change to glidescope) Placement Confirmation: ETT inserted through vocal cords under direct vision, positive ETCO2 and breath sounds checked- equal and bilateral Secured at: 24 cm Tube secured with: Tape Dental Injury: Teeth and Oropharynx as per pre-operative assessment  Difficulty Due To: Difficulty was anticipated and Difficult Airway- due to anterior larynx

## 2023-09-22 NOTE — Anesthesia Postprocedure Evaluation (Signed)
 Anesthesia Post Note  Patient: Lamar VEAR Drape  Procedure(s) Performed: ABLATION, PROSTATE, TRANSURETHRAL, USING WATERJET (Prostate)     Patient location during evaluation: PACU Anesthesia Type: General Level of consciousness: awake Pain management: pain level controlled Vital Signs Assessment: post-procedure vital signs reviewed and stable Respiratory status: spontaneous breathing, nonlabored ventilation and respiratory function stable Cardiovascular status: blood pressure returned to baseline and stable Postop Assessment: no apparent nausea or vomiting Anesthetic complications: no   No notable events documented.  Last Vitals:  Vitals:   09/22/23 1445 09/22/23 1514  BP: (!) 117/59 102/69  Pulse: (!) 51 65  Resp: 16 16  Temp:    SpO2: 100% 99%    Last Pain:  Vitals:   09/22/23 1514  TempSrc:   PainSc: 0-No pain                 Abdulkarim Eberlin P Harlyn Italiano

## 2023-09-22 NOTE — Discharge Instructions (Signed)
 Robotic water jet ablation of the prostate, Care After The following information offers guidance on how to care for yourself after your procedure. Your health care provider may also give you more specific instructions. If you have problems or questions, contact your health care provider. What can I expect after the procedure? After the procedure, it is common to have: Mild pain in your lower abdomen. Soreness or mild discomfort in your penis or when you urinate. This is from having the catheter inserted during the procedure. A sudden urge to urinate (urgency). A need to urinate often. A small amount of blood in your urine. You may notice some small blood clots in your urine. These are normal. Follow these instructions at home: Medicines Take over-the-counter and prescription medicines only as told by your health care provider. If you were prescribed an antibiotic medicine, take it as told by your health care provider. Do not stop taking the antibiotic even if you start to feel better. Activity  Rest as told by your health care provider. Avoid sitting for a long time without moving. Get up to take short walks every 1-2 hours. This is important to improve blood flow and breathing. Ask for help if you feel weak or unsteady. You may increase your physical activity gradually as you start to feel better. Do not drive or operate machinery until your health care provider says that it is safe. Do not ride in a car for long periods of time, or as told by your health care provider. Avoid intense physical activity for as long as told by your health care provider. Do not lift anything that is heavier than 10 lb (4.5 kg), or the limit that you are told, until your health care provider says that it is safe. Do not have sex until your health care provider approves. Return to your normal activities as told by your health care provider. Ask your health care provider what activities are safe for you. Preventing  constipation You may need to take these actions to prevent or treat constipation: Drink enough fluid to keep your urine pale yellow. Take over-the-counter or prescription medicines. Eat foods that are high in fiber, such as beans, whole grains, and fresh fruits and vegetables. Limit foods that are high in fat and processed sugars, such as fried or sweet foods.   General instructions Do not strain when you have a bowel movement. Straining may lead to bleeding from the prostate. This may cause blood clots and trouble urinating. Do not use any products that contain nicotine or tobacco. These products include cigarettes, chewing tobacco, and vaping devices, such as e-cigarettes. If you need help quitting, ask your health care provider. If you go home with a tube draining your urine (urinary catheter), care for the catheter as told by your health care provider. Wear compression stockings as told by your health care provider. These stockings help to prevent blood clots and reduce swelling in your legs. Keep all follow-up visits. This is important. Contact a health care provider if: You have signs of infection, such as: Fever or chills. Urine that smells very bad. Swelling around your urethra that is getting worse. Swelling in your penis or testicles. You have difficulty urinating. You have pain that gets worse or does not improve with medicine. You have blood in your urine that does not go away after 1 week of resting and drinking more fluids. You have trouble having a bowel movement. You have trouble having or keeping an erection. No semen  comes out during orgasm (dry ejaculation). You have a urinary catheter in place, and you have: Spasms or pain. Problems with your catheter or your catheter is blocked. Get help right away if: You are unable to urinate. You are having more blood clots in your urine instead of fewer. You have: Large blood clots. A lot of blood in your urine. Pain in your  back or lower abdomen. You have difficulty breathing or shortness of breath. You develop swelling or pain in your leg. These symptoms may be an emergency. Get help right away. Call 911. Do not wait to see if the symptoms will go away. Do not drive yourself to the hospital. Summary After the procedure, it is common to have a small amount of blood in your urine. Follow restrictions about lifting and sexual activity as told by your health care provider. Ask what activities are safe for you. Keep all follow-up visits. This is important. This information is not intended to replace advice given to you by your health care provider. Make sure you discuss any questions you have with your health care provider. Document Revised: 11/27/2020 Document Reviewed: 11/27/2020 Elsevier Patient Education  2024 [employer].

## 2023-09-23 DIAGNOSIS — G4733 Obstructive sleep apnea (adult) (pediatric): Secondary | ICD-10-CM | POA: Diagnosis not present

## 2023-09-23 LAB — SURGICAL PATHOLOGY

## 2023-09-25 ENCOUNTER — Encounter (HOSPITAL_BASED_OUTPATIENT_CLINIC_OR_DEPARTMENT_OTHER): Payer: Self-pay

## 2023-09-25 ENCOUNTER — Emergency Department (HOSPITAL_BASED_OUTPATIENT_CLINIC_OR_DEPARTMENT_OTHER)
Admission: EM | Admit: 2023-09-25 | Discharge: 2023-09-25 | Disposition: A | Discharge status: Home / Self Care | Attending: Emergency Medicine | Admitting: Emergency Medicine

## 2023-09-25 ENCOUNTER — Other Ambulatory Visit: Payer: Self-pay

## 2023-09-25 DIAGNOSIS — T83091A Other mechanical complication of indwelling urethral catheter, initial encounter: Secondary | ICD-10-CM | POA: Insufficient documentation

## 2023-09-25 DIAGNOSIS — Y828 Other medical devices associated with adverse incidents: Secondary | ICD-10-CM | POA: Diagnosis not present

## 2023-09-25 NOTE — ED Notes (Signed)
 Pt education provided about bladder irrigation, pt and wife verbalized understanding of technique and adequate supplies.

## 2023-09-25 NOTE — ED Provider Notes (Signed)
 [place] EMERGENCY DEPARTMENT AT [facility] [place] Provider Note   CSN: [id number] Arrival date & time: 09/25/23  0148     Patient presents with: No chief complaint on file.   Corey Ward is a 73 y.o. male.   The history is provided by the patient and medical records.  Corey Ward is a 73 y.o. male who presents to the Emergency Department complaining of catheter issue.  He presents the emergency department for difficulty with draining his Foley catheter.  He had an ablation performed on Tuesday and has a 22 [place] three-way catheter in place.  He states that the catheter stopped draining around 2 and 30 this evening and he had overflow leakage around the catheter.  No fevers, nausea, vomiting.  He is taking his antibiotics as prescribed.  He has had blood-tinged urine since the procedure.  No systemic symptoms.  No anticoagulants.       Prior to Admission medications   Medication Sig Start Date End Date Taking? Authorizing Provider  allopurinol  (ZYLOPRIM ) 300 MG tablet Take 300 mg by mouth at bedtime.     [provider]  cephALEXin  (KEFLEX ) 500 MG capsule Take 1 capsule (500 mg total) by mouth 2 (two) times daily. 09/22/23   Nieves Cough, MD  colchicine  0.6 MG tablet Take 0.6 mg by mouth daily as needed (gout).    [provider]  Continuous Glucose Sensor (FREESTYLE LIBRE 3 SENSOR) MISC Inject 1 Device into the skin every 14 (fourteen) days.    [provider]  ezetimibe -simvastatin  (VYTORIN ) 10-40 MG per tablet Take 1 tablet by mouth at bedtime.    [provider]  finasteride  (PROSCAR ) 5 MG tablet Take 5 mg by mouth every evening.     [provider]  MAGNESIUM  PO Take 1 tablet by mouth daily.    [provider]  melatonin 1 MG TABS tablet Take 1 mg by mouth at bedtime as needed (for sleep).    [provider]  metFORMIN (GLUCOPHAGE) 500 MG tablet Take 500 mg by mouth 2 (two) times daily with a meal.     [provider]  methocarbamol  (ROBAXIN -750) 750 MG tablet Take 1 tablet (750 mg total) by mouth every 6 (six) hours as needed for muscle spasms. Patient not taking: Reported on 09/08/2023 03/23/23   Stechschulte, Deward PARAS, MD  oxyCODONE -acetaminophen  (PERCOCET) 5-325 MG tablet Take 1 tablet by mouth every 4 (four) hours as needed for severe pain (pain score 7-10). Patient not taking: Reported on 09/08/2023 03/23/23 03/22/24  Stechschulte, Deward PARAS, MD  PRESCRIPTION MEDICATION See admin instructions. CPAP- At bedtime and during naptime(s)    [provider]  sildenafil (REVATIO) 20 MG tablet Take 60-100 mg by mouth daily as needed (erectile dysfunction.). 02/27/23   [provider]  tamsulosin  (FLOMAX ) 0.4 MG CAPS Take 0.4 mg by mouth in the morning and at bedtime. 06/04/12   [provider]  tirzepatide  (MOUNJARO ) 2.5 MG/0.5ML Pen Inject 2.5 mg into the skin once a week. Patient not taking: Reported on 09/08/2023 03/23/23     tirzepatide  (MOUNJARO ) 7.5 MG/0.5ML Pen Inject 7.5 mg into the skin once a week. 08/14/23       Allergies: Evolocumab    Review of Systems  All other systems reviewed and are negative.   Updated Vital Signs BP 128/72 (BP Location: Right Arm)   Pulse 73   Temp 97.6 F (36.4 C) (Oral)   Resp 16   SpO2 95%   Physical  Exam Vitals and nursing note reviewed.  Constitutional:      Appearance: He is well-developed.  HENT:     Head: Normocephalic and atraumatic.  Cardiovascular:     Rate and Rhythm: Normal rate and regular rhythm.  Pulmonary:     Effort: Pulmonary effort is normal. No respiratory distress.  Abdominal:     Palpations: Abdomen is soft.     Tenderness: There is no abdominal tenderness. There is no guarding or rebound.  Genitourinary:    Comments: 22 French three-way catheter in place with dark grossly bloody urine in the catheter bag Musculoskeletal:        General: No tenderness.  Skin:    General: Skin is warm and dry.   Neurological:     Mental Status: He is alert and oriented to person, place, and time.  Psychiatric:        Behavior: Behavior normal.     (all labs ordered are listed, but only abnormal results are displayed) Labs Reviewed - No data to display  EKG: None  Radiology: No results found.   Procedures   Medications Ordered in the ED - No data to display                                  Medical Decision Making  Patient with recent prostate ablation here for evaluation of decreased output from his Foley catheter.  Nursing was able to hand irrigate with return of 200 cc of grossly bloody urine.  This is near his baseline since procedure.  He is asymptomatic after this procedure.  Bedside ultrasound with minimal retained fluid in his bladder.  Discussed with patient home care for urinary retention in setting of Foley catheter use.  Nursing instructed patient on bladder irrigation at home as needed.  Discussed with Dr. Nieves with urology.  Plan to have the patient discharged home with outpatient urology follow-up and return precautions.  Current clinical picture is not consistent with life-threatening hemorrhage, infection.     Final diagnoses:  Obstruction of Foley catheter, initial encounter [facility])    ED Discharge Orders     None          Griselda Norris, MD 09/25/23 [id number]

## 2023-09-25 NOTE — ED Triage Notes (Signed)
 POV, Had a prostate ablation on Tuesday, has urinary catheter placed by urology, went to bed at 2300 empty the bag and them noticed that bag still empty. Denies pain or feeling of retention

## 2023-09-30 DIAGNOSIS — N401 Enlarged prostate with lower urinary tract symptoms: Secondary | ICD-10-CM | POA: Diagnosis not present

## 2023-09-30 DIAGNOSIS — R351 Nocturia: Secondary | ICD-10-CM | POA: Diagnosis not present

## 2023-09-30 DIAGNOSIS — R350 Frequency of micturition: Secondary | ICD-10-CM | POA: Diagnosis not present

## 2023-10-24 DIAGNOSIS — G4733 Obstructive sleep apnea (adult) (pediatric): Secondary | ICD-10-CM | POA: Diagnosis not present

## 2023-11-03 ENCOUNTER — Other Ambulatory Visit (HOSPITAL_BASED_OUTPATIENT_CLINIC_OR_DEPARTMENT_OTHER): Payer: Self-pay

## 2023-11-17 DIAGNOSIS — R351 Nocturia: Secondary | ICD-10-CM | POA: Diagnosis not present

## 2023-11-17 DIAGNOSIS — N401 Enlarged prostate with lower urinary tract symptoms: Secondary | ICD-10-CM | POA: Diagnosis not present

## 2023-11-17 DIAGNOSIS — R3912 Poor urinary stream: Secondary | ICD-10-CM | POA: Diagnosis not present

## 2023-11-17 DIAGNOSIS — R350 Frequency of micturition: Secondary | ICD-10-CM | POA: Diagnosis not present

## 2023-11-17 DIAGNOSIS — G4733 Obstructive sleep apnea (adult) (pediatric): Secondary | ICD-10-CM | POA: Diagnosis not present

## 2023-11-24 DIAGNOSIS — G4733 Obstructive sleep apnea (adult) (pediatric): Secondary | ICD-10-CM | POA: Diagnosis not present

## 2023-11-25 DIAGNOSIS — E119 Type 2 diabetes mellitus without complications: Secondary | ICD-10-CM | POA: Diagnosis not present

## 2023-11-25 DIAGNOSIS — M353 Polymyalgia rheumatica: Secondary | ICD-10-CM | POA: Diagnosis not present

## 2023-11-25 DIAGNOSIS — K635 Polyp of colon: Secondary | ICD-10-CM | POA: Diagnosis not present

## 2023-11-25 DIAGNOSIS — R059 Cough, unspecified: Secondary | ICD-10-CM | POA: Diagnosis not present

## 2023-11-25 DIAGNOSIS — H16139 Photokeratitis, unspecified eye: Secondary | ICD-10-CM | POA: Diagnosis not present

## 2023-11-25 DIAGNOSIS — F329 Major depressive disorder, single episode, unspecified: Secondary | ICD-10-CM | POA: Diagnosis not present

## 2023-11-25 DIAGNOSIS — I2510 Atherosclerotic heart disease of native coronary artery without angina pectoris: Secondary | ICD-10-CM | POA: Diagnosis not present

## 2023-11-25 DIAGNOSIS — U071 COVID-19: Secondary | ICD-10-CM | POA: Diagnosis not present

## 2023-12-23 DIAGNOSIS — M545 Low back pain, unspecified: Secondary | ICD-10-CM | POA: Diagnosis not present

## 2023-12-23 DIAGNOSIS — R32 Unspecified urinary incontinence: Secondary | ICD-10-CM | POA: Diagnosis not present

## 2023-12-23 DIAGNOSIS — E785 Hyperlipidemia, unspecified: Secondary | ICD-10-CM | POA: Diagnosis not present

## 2023-12-23 DIAGNOSIS — G4733 Obstructive sleep apnea (adult) (pediatric): Secondary | ICD-10-CM | POA: Diagnosis not present

## 2023-12-23 DIAGNOSIS — M109 Gout, unspecified: Secondary | ICD-10-CM | POA: Diagnosis not present

## 2023-12-23 DIAGNOSIS — E119 Type 2 diabetes mellitus without complications: Secondary | ICD-10-CM | POA: Diagnosis not present

## 2023-12-23 DIAGNOSIS — M1990 Unspecified osteoarthritis, unspecified site: Secondary | ICD-10-CM | POA: Diagnosis not present

## 2023-12-23 DIAGNOSIS — F325 Major depressive disorder, single episode, in full remission: Secondary | ICD-10-CM | POA: Diagnosis not present

## 2023-12-23 DIAGNOSIS — R609 Edema, unspecified: Secondary | ICD-10-CM | POA: Diagnosis not present

## 2024-01-01 ENCOUNTER — Emergency Department (HOSPITAL_BASED_OUTPATIENT_CLINIC_OR_DEPARTMENT_OTHER)
Admission: EM | Admit: 2024-01-01 | Discharge: 2024-01-01 | Disposition: A | Discharge status: Home / Self Care | Source: Ambulatory Visit | Attending: Emergency Medicine | Admitting: Emergency Medicine

## 2024-01-01 ENCOUNTER — Other Ambulatory Visit (HOSPITAL_BASED_OUTPATIENT_CLINIC_OR_DEPARTMENT_OTHER): Payer: Self-pay

## 2024-01-01 ENCOUNTER — Encounter (HOSPITAL_BASED_OUTPATIENT_CLINIC_OR_DEPARTMENT_OTHER): Payer: Self-pay | Admitting: Emergency Medicine

## 2024-01-01 ENCOUNTER — Other Ambulatory Visit: Payer: Self-pay

## 2024-01-01 DIAGNOSIS — R197 Diarrhea, unspecified: Secondary | ICD-10-CM | POA: Diagnosis not present

## 2024-01-01 DIAGNOSIS — E871 Hypo-osmolality and hyponatremia: Secondary | ICD-10-CM | POA: Diagnosis not present

## 2024-01-01 DIAGNOSIS — E876 Hypokalemia: Secondary | ICD-10-CM | POA: Insufficient documentation

## 2024-01-01 DIAGNOSIS — R112 Nausea with vomiting, unspecified: Secondary | ICD-10-CM | POA: Diagnosis not present

## 2024-01-01 LAB — COMPREHENSIVE METABOLIC PANEL WITH GFR
ALT: 5 U/L (ref 0–44)
AST: 17 U/L (ref 15–41)
Albumin: 4.4 g/dL (ref 3.5–5.0)
Alkaline Phosphatase: 82 U/L (ref 38–126)
Anion gap: 14 (ref 5–15)
BUN: 14 mg/dL (ref 8–23)
CO2: 23 mmol/L (ref 22–32)
Calcium: 9.6 mg/dL (ref 8.9–10.3)
Chloride: 97 mmol/L — ABNORMAL LOW (ref 98–111)
Creatinine, Ser: 0.95 mg/dL (ref 0.61–1.24)
GFR, Estimated: >60 mL/min (ref >60)
Glucose, Bld: 134 mg/dL — ABNORMAL HIGH (ref 70–99)
Potassium: 3.2 mmol/L — ABNORMAL LOW (ref 3.5–5.1)
Sodium: 134 mmol/L — ABNORMAL LOW (ref 135–145)
Total Bilirubin: 0.8 mg/dL (ref 0.0–1.2)
Total Protein: 7.7 g/dL (ref 6.5–8.1)

## 2024-01-01 LAB — CBC
HCT: 40.2 % (ref 39.0–52.0)
Hemoglobin: 13.5 g/dL (ref 13.0–17.0)
MCH: 29.7 pg (ref 26.0–34.0)
MCHC: 33.6 g/dL (ref 30.0–36.0)
MCV: 88.4 fL (ref 80.0–100.0)
Platelets: 269 K/uL (ref 150–400)
RBC: 4.55 MIL/uL (ref 4.22–5.81)
RDW: 14.3 % (ref 11.5–15.5)
WBC: 7.9 K/uL (ref 4.0–10.5)
nRBC: 0 % (ref 0.0–0.2)

## 2024-01-01 LAB — URINALYSIS, ROUTINE W REFLEX MICROSCOPIC
Bacteria, UA: NONE SEEN
Bilirubin Urine: NEGATIVE
Glucose, UA: NEGATIVE mg/dL
Ketones, ur: NEGATIVE mg/dL
Leukocytes,Ua: NEGATIVE
Nitrite: NEGATIVE
Protein, ur: 30 mg/dL — AB
Specific Gravity, Urine: 1.022 (ref 1.005–1.030)
pH: 6 (ref 5.0–8.0)

## 2024-01-01 LAB — LIPASE, BLOOD: Lipase: 14 U/L (ref 11–51)

## 2024-01-01 MED ORDER — ONDANSETRON 4 MG PO TBDP
4.0000 mg | ORAL_TABLET | Freq: Three times a day (TID) | ORAL | 0 refills | Status: AC | PRN
  Filled 2024-01-01: qty 8, 3d supply, fill #0

## 2024-01-01 MED ORDER — POTASSIUM CHLORIDE CRYS ER 20 MEQ PO TBCR
20.0000 meq | EXTENDED_RELEASE_TABLET | Freq: Two times a day (BID) | ORAL | 0 refills | Status: AC
  Filled 2024-01-01: qty 6, 3d supply, fill #0

## 2024-01-01 NOTE — ED Provider Notes (Signed)
 [place] EMERGENCY DEPARTMENT AT [facility] Provider Note   CSN: [id number] Arrival date & time: 01/01/24  1118     Patient presents with: Emesis   Corey Ward is a 73 y.o. male.    Emesis Patient presents with nausea vomiting diarrhea.  Has had around 5 days of symptoms.  Thinks it may have been from some bad chicken he had on Friday, a week ago.  Has had diarrhea on and off.  Been doing well a couple days ago then returned.  Talk to PCP and said to come into the ER.  No real abdominal pain.  No fevers.  No known sick contacts.     Prior to Admission medications   Medication Sig Start Date End Date Taking? Authorizing Provider  ondansetron  (ZOFRAN -ODT) 4 MG disintegrating tablet Take 1 tablet (4 mg total) by mouth every 8 (eight) hours as needed. 01/01/24  Yes Patsey Lot, MD  potassium chloride  SA (KLOR-CON  M) 20 MEQ tablet Take 1 tablet (20 mEq total) by mouth 2 (two) times daily. 01/01/24  Yes Patsey Lot, MD  allopurinol  (ZYLOPRIM ) 300 MG tablet Take 300 mg by mouth at bedtime.     [provider]  cephALEXin  (KEFLEX ) 500 MG capsule Take 1 capsule (500 mg total) by mouth 2 (two) times daily. 09/22/23   Nieves Cough, MD  colchicine  0.6 MG tablet Take 0.6 mg by mouth daily as needed (gout).    [provider]  Continuous Glucose Sensor (FREESTYLE LIBRE 3 SENSOR) MISC Inject 1 Device into the skin every 14 (fourteen) days.    [provider]  ezetimibe -simvastatin  (VYTORIN ) 10-40 MG per tablet Take 1 tablet by mouth at bedtime.    [provider]  finasteride  (PROSCAR ) 5 MG tablet Take 5 mg by mouth every evening.     [provider]  MAGNESIUM  PO Take 1 tablet by mouth daily.    [provider]  melatonin 1 MG TABS tablet Take 1 mg by mouth at bedtime as needed (for sleep).    [provider]  metFORMIN (GLUCOPHAGE) 500 MG tablet Take 500 mg by mouth 2 (two) times daily with a meal.     [provider]  methocarbamol  (ROBAXIN -750) 750 MG tablet Take 1 tablet (750 mg total) by mouth every 6 (six) hours as needed for muscle spasms. Patient not taking: Reported on 09/08/2023 03/23/23   Stechschulte, Deward PARAS, MD  oxyCODONE -acetaminophen  (PERCOCET) 5-325 MG tablet Take 1 tablet by mouth every 4 (four) hours as needed for severe pain (pain score 7-10). Patient not taking: Reported on 09/08/2023 03/23/23 03/22/24  Stechschulte, Deward PARAS, MD  PRESCRIPTION MEDICATION See admin instructions. CPAP- At bedtime and during naptime(s)    [provider]  sildenafil (REVATIO) 20 MG tablet Take 60-100 mg by mouth daily as needed (erectile dysfunction.). 02/27/23   [provider]  tamsulosin  (FLOMAX ) 0.4 MG CAPS Take 0.4 mg by mouth in the morning and at bedtime. 06/04/12   [provider]  tirzepatide  (MOUNJARO ) 2.5 MG/0.5ML Pen Inject 2.5 mg into the skin once a week. Patient not taking: Reported on 09/08/2023 03/23/23     tirzepatide  (MOUNJARO ) 7.5 MG/0.5ML Pen Inject 7.5 mg into the skin once a week. 08/14/23       Allergies: Evolocumab    Review of Systems  Gastrointestinal:  Positive for vomiting.    Updated Vital Signs BP (!) 144/93 (BP Location: Right Arm)   Pulse 60   Temp (!) 97.5 F (36.4 C) (  Oral)   Resp 17   Wt 102.1 kg   SpO2 100%   BMI 27.39 kg/m   Physical Exam Vitals and nursing note reviewed.  HENT:     Head: Atraumatic.  Cardiovascular:     Rate and Rhythm: Regular rhythm.  Abdominal:     Tenderness: There is no abdominal tenderness.  Skin:    Capillary Refill: Capillary refill takes less than 2 seconds.  Neurological:     Mental Status: He is alert and oriented to person, place, and time.     (all labs ordered are listed, but only abnormal results are displayed) Labs Reviewed  COMPREHENSIVE METABOLIC PANEL WITH GFR - Abnormal; Notable for the following components:      Result Value   Sodium 134 (*)    Potassium 3.2 (*)     Chloride 97 (*)    Glucose, Bld 134 (*)    All other components within normal limits  URINALYSIS, ROUTINE W REFLEX MICROSCOPIC - Abnormal; Notable for the following components:   Hgb urine dipstick TRACE (*)    Protein, ur 30 (*)    All other components within normal limits  LIPASE, BLOOD  CBC    EKG: None  Radiology: No results found.   Procedures   Medications Ordered in the ED - No data to display                                  Medical Decision Making Amount and/or Complexity of Data Reviewed Labs: ordered.  Risk Prescription drug management.   Patient with nausea vomiting diarrhea.  Differential diagnose includes food poisoning, gastroenteritis, other causes.  Well-appearing.  Does not appear overtly dehydrated at this point.  Has been tolerating some orals.  Has a mild hypokalemia.  Doubt obstruction.  Do not think we need stool cultures at this time.  Do not think we need antibiotics.  Will treat with antiemetics and some potassium.  Discharge home.     Final diagnoses:  Nausea vomiting and diarrhea  Hypokalemia    ED Discharge Orders          Ordered    ondansetron  (ZOFRAN -ODT) 4 MG disintegrating tablet  Every 8 hours PRN        01/01/24 1248    potassium chloride  SA (KLOR-CON  M) 20 MEQ tablet  2 times daily        01/01/24 1250               Patsey Lot, MD 01/01/24 1251

## 2024-01-01 NOTE — ED Triage Notes (Signed)
 Pt c/o n/v/d that started x 5 days pta. Seemed to be improving on Wednesday, but returned yesterday. Denies abd pain at this time

## 2024-01-18 ENCOUNTER — Inpatient Hospital Stay

## 2024-01-18 DIAGNOSIS — Z1379 Encounter for other screening for genetic and chromosomal anomalies: Secondary | ICD-10-CM | POA: Diagnosis not present

## 2024-01-18 DIAGNOSIS — Z803 Family history of malignant neoplasm of breast: Secondary | ICD-10-CM

## 2024-01-18 DIAGNOSIS — Z808 Family history of malignant neoplasm of other organs or systems: Secondary | ICD-10-CM | POA: Diagnosis not present

## 2024-01-18 LAB — GENETIC SCREENING ORDER

## 2024-01-18 NOTE — Progress Notes (Signed)
 REFERRING PROVIDER: Shayne Anes, MD [address] [place],  [place] [address]  PRIMARY PROVIDER:  Shayne Anes, MD  PRIMARY REASON FOR VISIT:  1. Family history of breast cancer   2. Family history of skin cancer     HISTORY OF PRESENT ILLNESS:   Mr. Divita, a 73 y.o. male, was seen on 01/18/2024 for a [place] cancer genetics consultation at the request of Mr. Watford (himself) due to a family history of breast cancer. Mr. Frisbie wife was seen in [facility] due to her diagnosis of breast cancer, which is where Mr. Friedel family history of breast cancer came to light. Mr. Schueller presents to clinic today to discuss the possibility of a hereditary predisposition to cancer, genetic testing, and to further clarify his future cancer risks, as well as potential cancer risks for family members.   CANCER HISTORY:  Oncology History   No history exists.    Mr. Eschbach is a 73 y.o. male with no personal history of cancer.    RELEVANT MEDICAL HISTORY AND RISK FACTORS:  Colonoscopy: yes; his most recent colonoscopy was in 12/2017 and found one tubular adenoma. He had previous colonoscopies in 08/2012 and 10/2003 which found one tubular adenoma (2014 colonoscopy) and no polyps (2005 colonoscopy) PSA/prostate screening: yes; normal. Gets PSA at least annually. 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    Past Medical History:  Diagnosis Date   Arthritis    Asthma    When younger no problem now   BPH (benign prostatic hyperplasia)    Diabetes mellitus without complication (HCC)    Family history of breast cancer    Family history of skin cancer    Gout    Hyperlipidemia    OSA (obstructive sleep apnea)    PONV (postoperative nausea and vomiting)    Sepsis (HCC)    Sleep  apnea    Urinary retention     Past Surgical History:  Procedure Laterality Date   calcium deposits removed from ankle     Left   CARPAL TUNNEL RELEASE Right 12/21/2020   Procedure: RIGHT CARPAL TUNNEL RELEASE;  Surgeon: Murrell Kuba, MD;  Location: [facility];  Service: Orthopedics;  Laterality: Right;   INGUINAL HERNIA REPAIR Right    INGUINAL HERNIA REPAIR Bilateral 03/23/2023   Procedure: LAPAROSCOPIC BILATERAL INGUINAL HERNIA REPAIR WITH MESH;  Surgeon: Stechschulte, Deward PARAS, MD;  Location: [facility];  Service: General;  Laterality: Bilateral;   KNEE SURGERY     arthroscopic  surg   REPLACEMENT TOTAL KNEE Bilateral    TENDON REPAIR Right    foot   TONSILLECTOMY     TOTAL HIP ARTHROPLASTY Right 09/08/2018   Procedure: TOTAL HIP ARTHROPLASTY ANTERIOR APPROACH;  Surgeon: Melodi Lerner, MD;  Location: [facility];  Service: Orthopedics;  Laterality: Right;     Social History   Socioeconomic History   Marital status: Married    Spouse name: Not on file   Number of children: Not on file   Years of education: Not on file   Highest education level: Not on file  Occupational History   Not on file  Tobacco Use   Smoking status: Never   Smokeless tobacco: Never  Vaping Use   Vaping status: Never Used  Substance and Sexual Activity  Alcohol use: No   Drug use: No   Sexual activity: Yes  Other Topics Concern   Not on file  Social History Narrative   Not on file   Social Drivers of Health   Financial Resource Strain: Not on file  Food Insecurity: No Food Insecurity (04/17/2023)   Hunger Vital Sign    Worried About Running Out of Food in the Last Year: Never true    Ran Out of Food in the Last Year: Never true  Transportation Needs: No Transportation Needs (04/17/2023)   PRAPARE - Administrator, Civil Service (Medical): No    Lack of Transportation (Non-Medical): No  Physical Activity: Not on file  Stress: Not on file  Social Connections: Not on  file     FAMILY HISTORY:  We obtained a detailed, 4-generation family history.  Significant diagnoses are listed below: Family History  Problem Relation Age of Onset   Skin cancer Mother 82 - 50       non melanoma skin cancer   Heart disease Father    Breast cancer Sister 53 - 82       d. 68. reportedly tested positive for breast and colon cancer gene   Cancer Maternal Aunt        unknown type of cancer   Colon cancer Neg Hx    Esophageal cancer Neg Hx    Rectal cancer Neg Hx    Stomach cancer Neg Hx     Mr. Musial sister Velia was diagnosed with breast cancer in her 13s and died at the age of 108. She reportedly had genetic testing and tested positive for a breast and colon cancer gene. I do not have a genetic test report. The report has tried to be located but has been so far unsuccessful. Mr. Tanguma mother was diagnosed with non-melanoma skin cancer in her 73s.  Mr. Blough father was diagnosed with something with his prostate in his older age but Mr. Mault is unsure if this was cancer or not. His maternal half-aunt was diagnosed with an unknown cancer  Mr. Sonneborn is unaware of previous family history of genetic testing for hereditary cancer risks other than what is mentioned above. There is no reported Ashkenazi Jewish ancestry. There is no known consanguinity.  GENETIC COUNSELING ASSESSMENT:  Mr. Rossin is a 73 y.o. male with a family history of breast cancer which is somewhat suggestive of a hereditary cancer syndrome and predisposition to cancer given this history. We, therefore, discussed and recommended the following at today's visit.   DISCUSSION: We discussed that, in general, most cancer is not inherited in families, but instead is sporadic or familial. Sporadic cancers occur by chance and typically happen at older ages (>50 years) as this type of cancer is caused by genetic changes acquired during an individual's lifetime. Some families have more cancers than would be  expected by chance; however, the ages or types of cancer are not consistent with a known genetic mutation or known genetic mutations have been ruled out. This type of familial cancer is thought to be due to a combination of multiple genetic, environmental, hormonal, and lifestyle factors. While this combination of factors likely increases the risk of cancer, the exact source of this risk is not currently identifiable or testable.  We discussed that 5 - 10% of breast is hereditary. Most cases of hereditary breast cancer are associated with BRCA1 and BRCA2 genes, although there are other genes associated with hereditary breast cancer as well.  It is not clear at this time what his sister might have tested positive for since we have not seen a genetic testing report for her. Cancer risks and management strategies are gene specific. We discussed that genetic testing can beneficial for several reasons, including clarifying specific cancer risks, identifying potential screening and risk-reduction options that may be appropriate, and to understand if other family members could be at risk for cancer and allow them to undergo genetic testing to clarify their cancer risks.   We reviewed the characteristics, features and inheritance patterns of hereditary cancer syndromes. We discussed that if Mr. Gowan wife tested positive for a hereditary cancer gene mutation, then he would have a 50/50 chance to share this same variant. We also discussed that if Mr. Schear were to test positive, each of his children would be at a 50/50 chance to have the variant. We also discussed genetic testing, including the appropriate family members to test, the process of testing, insurance coverage and turn-around-time for results. Mr. Gallicchio brother also qualifies for genetic testing based on the family history and is recommended to get genetic testing. We discussed the implications of a negative, positive, carrier and/or variant of uncertain  significant result.   Mr. Bacchi was offered the [facility] CancerNext + RNAinsight gene panel which includes sequencing, rearrangement analysis, and RNA analysis for the following 40 genes: APC, ATM, BAP1, BARD1, BMPR1A, BRCA1, BRCA2, BRIP1, CDH1, CDKN2A, CHEK2, FH, FLCN, MET, MLH1, MSH2, MSH6, MUTYH, NF1, NTHL1, PALB2, PMS2, PTEN, RAD51C, RAD51D, RPS20, SMAD4, STK11, TP53, TSC1, TSC2, and VHL (sequencing and deletion/duplication); AXIN2, HOXB13, MBD4, MSH3, POLD1 and POLE (sequencing only); EPCAM and GREM1 (deletion/duplication only).  Mr. Pavao was also offered the [facility] CancerNext-Expanded + RNAinsight gene panel which includes sequencing, rearrangement, and RNA analysis for the following 77 genes: AIP, ALK, APC, ATM, AXIN2, BAP1, BARD1, BMPR1A, BRCA1, BRCA2, BRIP1, CDC73, CDH1, CDK4, CDKN1B, CDKN2A, CEBPA, CHEK2, CTNNA1, DDX41, DICER1, ETV6, FH, FLCN, GATA2, LZTR1, MAX, MBD4, MEN1, MET, MLH1, MSH2, MSH3, MSH6, MUTYH, NF1, NF2, NTHL1, PALB2, PHOX2B, PMS2, POT1, PRKAR1A, PTCH1, PTEN, RAD51C, RAD51D, RB1, RET, RPS20, RUNX1, SDHA, SDHAF2, SDHB, SDHC, SDHD, SMAD4, SMARCA4, SMARCB1, SMARCE1, STK11, SUFU, TMEM127, TP53, TSC1, TSC2, VHL, and WT1 (sequencing and deletion/duplication); EGFR, HOXB13, KIT, MITF, PDGFRA, POLD1, and POLE (sequencing only); EPCAM and GREM1 (deletion/duplication only).    Mr. Lips was informed of the benefits and limitations of each panel, including that expanded pan-cancer panels contain genes may not have clear management guidelines at this point in time. We also discussed that as the number of genes included on a panel increases, the chances of variants of uncertain significance increases. After considering the risks, benefits, and limitations, Mr. Gingerich provided informed consent to pursue genetic testing. Mr. Demartin decided to pursue genetic testing for the [facility] CancerNext- Expanded + RNA gene panel.   Based on Mr. Lizotte family history of cancer, he meets medical criteria for genetic  testing. He meets medical criteria for genetic testing due to his sister being diagnosed with breast cancer under the age of 58. Though Mr. Longsworth is not personally affected, there are no affected family members that are willing/able/available to undergo hereditary cancer testing, nor do we have a copy of his sister's genetic testing report to confirm the reported variant. Therefore, Mr. Naval the most informative family member available.  Despite that he meets criteria, he may still have an out of pocket cost. We discussed that if his out of pocket cost for testing is over $100, the laboratory  will call and confirm whether he wants to proceed with testing.  If the out of pocket cost of testing is less than $100 he will be billed by the genetic testing laboratory.   We discussed that some people do not want to undergo genetic testing due to fear of genetic discrimination.  The Genetic Information Nondiscrimination Act (GINA) was signed into federal law in 2008. GINA prohibits health insurers and most employers from discriminating against individuals based on genetic information (including the results of genetic tests and family history information). According to GINA, health insurance companies cannot consider genetic information to be a preexisting condition, nor can they use it to make decisions regarding coverage or rates. GINA also makes it illegal for most employers to use genetic information in making decisions about hiring, firing, promotion, or terms of employment. It is important to note that GINA does not offer protections for life insurance, disability insurance, or long-term care insurance. GINA does not apply to those in the [employer], those who work for companies with less than 15 employees, and new life insurance or long-term disability insurance policies.  Health status due to a cancer diagnosis is not protected under GINA. More information about GINA can be found by visiting [email or web address].    PLAN: After considering the risks, benefits, and limitations, Mr. Braithwaite provided informed consent to pursue genetic testing and the blood sample was sent to [facility] for analysis of the CancerNext-Expanded + RNA. Results should be available within approximately 2-3 weeks' time, at which point they will be disclosed by telephone to Mr. Mcgue, as will any additional recommendations warranted by these results. Mr. Kimrey will receive a summary of his genetic counseling visit and a copy of his results once available. This information will also be available in Epic.   Based on Mr. Sciandra family history, we recommended his brother consider genetic counseling and testing, as he meets medical criteria for testing based on the family history. Mr. Bognar will let us  know if we can be of any assistance in coordinating genetic counseling and/or testing for this family member.   RESOURCES PROVIDED:  Mr. Haverstock was provided with the following:  [facility] Billing information  [facility] Contact card   Lastly, we encouraged Mr. Kahre to remain in contact with cancer genetics annually so that we can continuously update the family history and inform him of any changes in cancer genetics and testing that may be of benefit for this family.   Mr. Reichardt questions were answered to his satisfaction today. Our contact information was provided should additional questions or concerns arise. Thank you for the referral and allowing us  to share in the care of your patient.   Emylie Amster R. Bluford, MS, [facility] Certified [email or web address] phone: [phone]  I personally spent a total of 60 minutes in the care of the patient today including preparing to see the patient, getting/reviewing separately obtained history, counseling and educating, placing orders, and documenting clinical information in the EHR.  The patient was seen alone.  Drs. Lanny Stalls, and/or Gudena were available for  questions, if needed.   _______________________________________________________________________ For Office Staff:  Number of people involved in session: 1 Was an Intern/ student involved with case: no

## 2024-01-20 ENCOUNTER — Telehealth: Payer: Medicare PPO | Admitting: Neurology

## 2024-01-20 DIAGNOSIS — G4733 Obstructive sleep apnea (adult) (pediatric): Secondary | ICD-10-CM
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
## 2024-01-20 NOTE — Patient Instructions (Signed)
 Great to see you today! Continue CPAP usage minimum 4 hours nightly Continue current settings Continue to replace supplies routinely through DME Follow-up in 1 year or sooner if needed. Thanks!!

## 2024-01-21 DIAGNOSIS — Z23 Encounter for immunization: Secondary | ICD-10-CM | POA: Diagnosis not present

## 2024-02-03 ENCOUNTER — Telehealth: Payer: Self-pay

## 2024-02-03 DIAGNOSIS — Z1379 Encounter for other screening for genetic and chromosomal anomalies: Secondary | ICD-10-CM | POA: Insufficient documentation

## 2024-02-03 NOTE — Telephone Encounter (Signed)
 LM for pt to call back to discuss results. Provided call back number.

## 2024-02-04 ENCOUNTER — Ambulatory Visit: Payer: Self-pay

## 2024-02-04 ENCOUNTER — Telehealth: Payer: Self-pay

## 2024-02-04 DIAGNOSIS — Z1379 Encounter for other screening for genetic and chromosomal anomalies: Secondary | ICD-10-CM

## 2024-02-04 DIAGNOSIS — Z803 Family history of malignant neoplasm of breast: Secondary | ICD-10-CM

## 2024-02-04 DIAGNOSIS — Z808 Family history of malignant neoplasm of other organs or systems: Secondary | ICD-10-CM

## 2024-02-04 NOTE — Progress Notes (Signed)
 HPI:  Corey Ward was previously seen in the [facility] due to a family history of breast cancer and concerns regarding a hereditary predisposition to cancer. Please refer to our prior cancer genetics clinic note for more information regarding our discussion, assessment and recommendations, at the time. Corey Ward recent genetic test results were disclosed to him, as were recommendations warranted by these results. These results and recommendations are discussed in more detail below.  CANCER HISTORY:  Oncology History   No history exists.   Corey Ward is a 73 y.o. male with no personal history of cancer.   FAMILY HISTORY:  We obtained a detailed, 4-generation family history.  Significant diagnoses are listed below: Family History  Problem Relation Age of Onset   Skin cancer Mother 3 - 46       non melanoma skin cancer   Heart disease Father    Breast cancer Sister 37 - 72       d. 68. reportedly tested positive for breast and colon cancer gene   Cancer Maternal Aunt        unknown type of cancer   Colon cancer Neg Hx    Esophageal cancer Neg Hx    Rectal cancer Neg Hx    Stomach cancer Neg Hx      Mr. Skillern sister Velia was diagnosed with breast cancer in her 21s and died at the age of 23. She reportedly had genetic testing and tested positive for a breast and colon cancer gene. I do not have a genetic test report. The report has tried to be located but has been so far unsuccessful. Mr. Lamagna mother was diagnosed with non-melanoma skin cancer in her 63s.  Mr. Raschke father was diagnosed with something with his prostate in his older age but Mr. Reitter is unsure if this was cancer or not. His maternal half-aunt was diagnosed with an unknown cancer   Mr. Tokarz is unaware of previous family history of genetic testing for hereditary cancer risks other than what is mentioned above. There is no reported Ashkenazi Jewish ancestry. There is no known  consanguinity.   GENETIC TEST RESULTS: Genetic testing reported out on 01/28/2024 through the [facility]-Expanded + RNAinsight gene panel which includes sequencing, rearrangement, and RNA analysis for the following 77 genes: AIP, ALK, APC, ATM, AXIN2, BAP1, BARD1, BMPR1A, BRCA1, BRCA2, BRIP1, CDC73, CDH1, CDK4, CDKN1B, CDKN2A, CEBPA, CHEK2, CTNNA1, DDX41, DICER1, ETV6, FH, FLCN, GATA2, LZTR1, MAX, MBD4, MEN1, MET, MLH1, MSH2, MSH3, MSH6, MUTYH, NF1, NF2, NTHL1, PALB2, PHOX2B, PMS2, POT1, PRKAR1A, PTCH1, PTEN, RAD51C, RAD51D, RB1, RET, RPS20, RUNX1, SDHA, SDHAF2, SDHB, SDHC, SDHD, SMAD4, SMARCA4, SMARCB1, SMARCE1, STK11, SUFU, TMEM127, TP53, TSC1, TSC2, VHL, and WT1 (sequencing and deletion/duplication); EGFR, HOXB13, KIT, MITF, PDGFRA, POLD1, and POLE (sequencing only); EPCAM and GREM1 (deletion/duplication only).    This cancer panel found no pathogenic mutations in any of the genes listed above. The test report has been scanned into EPIC and is located under the Molecular Pathology section of the Results Review tab.  A portion of the result report is included below for reference.     We discussed with Corey Ward that because current genetic testing is not perfect, it is possible there may be a gene mutation in one of these genes that current testing cannot detect, but that chance is small.  We also discussed, that there could be another gene that has not yet been discovered, or that we have not yet tested, that is responsible  for the cancer diagnoses in the family. It is also possible there is a hereditary cause for the cancer in the family that Corey Ward did not inherit and therefore was not identified in his testing.  Therefore, it is important to remain in touch with cancer genetics in the future so that we can continue to offer Corey Ward the most up to date genetic testing.   Genetic testing did identify a variant of uncertain significance (VUS) was identified in the CHEK2 gene called CHEK2  c.190G>A.  At this time, it is unknown if this variant is associated with increased cancer risk or if this is a normal finding, but most variants such as this get reclassified to being inconsequential. It should not be used to make medical management decisions. With time, we suspect the lab will determine the significance of this variant, if any. If we do learn more about it, we will try to contact Corey Ward to discuss it further. However, it is important to stay in touch with us  periodically and keep the address and phone number up to date. With this, we discussed that it is possible that his sister also had this same VUS, as CHEK2 (when pathogenic) was formerly thought to also have associations with colon cancer as well as breast cancer.  ADDITIONAL GENETIC TESTING: We discussed with Corey Ward that his genetic testing was fairly extensive.  If there are genes identified to increase cancer risk that can be analyzed in the future, we would be happy to discuss and coordinate this testing at that time.    CANCER SCREENING RECOMMENDATIONS: Corey Ward test result is considered negative (normal).  This means that we have not identified a hereditary cause for his family history of breast cancer at this time. Most cancers happen by chance and this negative test suggests that his family history of breast cancer may fall into this category.    Possible reasons for Corey Ward negative genetic test include:  1. There may be a gene mutation in one of these genes that current testing methods cannot detect but that chance is small.  2. There could be another gene that has not yet been discovered, or that we have not yet tested, that is responsible for the cancer diagnoses in the family.  3.  There may be no hereditary risk for cancer in the family. The cancers in Corey Ward and/or his family may be sporadic/familial or due to other genetic and environmental factors. 4. It is also possible there is a hereditary cause for the  cancer in the family that [name] did not inherit.  Therefore, it is recommended he continue to follow the cancer management and screening guidelines provided by his primary healthcare provider. An individual's cancer risk and medical management are not determined by genetic test results alone. Overall cancer risk assessment incorporates additional factors, including personal medical history, family history, and any available genetic information that may result in a personalized plan for cancer prevention and surveillance  An individual's cancer risk and medical management are not determined by genetic test results alone. Overall cancer risk assessment incorporates additional factors, including personal medical history, family history, and any available genetic information that may result in a personalized plan for cancer prevention and surveillance.  RECOMMENDATIONS FOR FAMILY MEMBERS:   Since he did not inherit a identifiable mutation in a cancer predisposition gene included on this panel, his children could not have inherited a known mutation from his in one of these genes. Individuals  in this family might be at some increased risk of developing cancer, over the general population risk, simply due to the family history of cancer.  We recommended women in this family have a yearly mammogram beginning at age 17, or 18 years younger than the earliest onset of cancer, an annual clinical breast exam, and perform monthly breast self-exams. Women in this family should also have a gynecological exam as recommended by their primary provider. All family members should be referred for colonoscopy starting at age 31, or 31 years younger than the earliest onset of cancer. It is also possible there is a hereditary cause for the cancer in Mr. Auxier family that he did not inherit and therefore was not identified in him.  Based on Mr. Mizrahi family history, his brother also meets genetic testing criteria based on the  family history of a first degree relative with breast cancer under the age of 68.  Mr. Pfeifle will let us  know if we can be of any assistance in coordinating genetic counseling and/or testing for this family member.   FOLLOW-UP: Lastly, we discussed with Mr. Flenner that cancer genetics is a rapidly advancing field and it is possible that new genetic tests will be appropriate for him and/or his family members in the future. We encouraged him to remain in contact with cancer genetics on an annual basis so we can update his personal and family histories and let him know of advances in cancer genetics that may benefit this family.   Our contact number was provided. Mr. Lazenby questions were answered to his satisfaction, and he knows he is welcome to call us  at anytime with additional questions or concerns.   Warren Ahle, MS, [facility] Cancer Genetic Counselor [email or web address] [phone]

## 2024-02-04 NOTE — Telephone Encounter (Signed)
 I contacted Corey Ward to discuss his genetic testing results. The test that was ordered was the [facility]-Expanded + RNAinsight gene panel which includes sequencing, rearrangement, and RNA analysis for the following 77 genes: AIP, ALK, APC, ATM, AXIN2, BAP1, BARD1, BMPR1A, BRCA1, BRCA2, BRIP1, CDC73, CDH1, CDK4, CDKN1B, CDKN2A, CEBPA, CHEK2, CTNNA1, DDX41, DICER1, ETV6, FH, FLCN, GATA2, LZTR1, MAX, MBD4, MEN1, MET, MLH1, MSH2, MSH3, MSH6, MUTYH, NF1, NF2, NTHL1, PALB2, PHOX2B, PMS2, POT1, PRKAR1A, PTCH1, PTEN, RAD51C, RAD51D, RB1, RET, RPS20, RUNX1, SDHA, SDHAF2, SDHB, SDHC, SDHD, SMAD4, SMARCA4, SMARCB1, SMARCE1, STK11, SUFU, TMEM127, TP53, TSC1, TSC2, VHL, and WT1 (sequencing and deletion/duplication); EGFR, HOXB13, KIT, MITF, PDGFRA, POLD1, and POLE (sequencing only); EPCAM and GREM1 (deletion/duplication only).    The report date is 01/28/2024. No pathogenic variants were identified in the 77 genes analyzed. One variant of uncertain significance (VUS) was detected in the CHEK2 gene called CHEK2 c.190G>A. We do not recommend changes in medical management or testing family members for this variant based on this result. Detailed clinic note to follow.   The test report has been scanned into EPIC and is located under the Molecular Pathology section of the Results Review tab.  A portion of the result report is included below for reference.      Corey Ahle, MS, [facility] Cancer Genetic Counselor [email or web address] [phone]

## 2024-02-09 DIAGNOSIS — E119 Type 2 diabetes mellitus without complications: Secondary | ICD-10-CM | POA: Diagnosis not present

## 2024-02-09 DIAGNOSIS — G4730 Sleep apnea, unspecified: Secondary | ICD-10-CM | POA: Diagnosis not present

## 2024-02-09 DIAGNOSIS — I2510 Atherosclerotic heart disease of native coronary artery without angina pectoris: Secondary | ICD-10-CM | POA: Diagnosis not present

## 2024-02-09 DIAGNOSIS — E669 Obesity, unspecified: Secondary | ICD-10-CM | POA: Diagnosis not present

## 2025-01-25 ENCOUNTER — Telehealth: Admitting: Neurology
